# Patient Record
Sex: Female | Born: 1965
Health system: Southern US, Community
[De-identification: ages and names within clinical notes are randomized; demographics above are authoritative.]

## PROBLEM LIST (undated history)

## (undated) DIAGNOSIS — G8929 Other chronic pain: Secondary | ICD-10-CM

## (undated) DIAGNOSIS — N939 Abnormal uterine and vaginal bleeding, unspecified: Secondary | ICD-10-CM

## (undated) DIAGNOSIS — M542 Cervicalgia: Secondary | ICD-10-CM

## (undated) DIAGNOSIS — F419 Anxiety disorder, unspecified: Secondary | ICD-10-CM

## (undated) DIAGNOSIS — N809 Endometriosis, unspecified: Secondary | ICD-10-CM

## (undated) DIAGNOSIS — N946 Dysmenorrhea, unspecified: Secondary | ICD-10-CM

## (undated) DIAGNOSIS — D649 Anemia, unspecified: Secondary | ICD-10-CM

## (undated) DIAGNOSIS — G4700 Insomnia, unspecified: Secondary | ICD-10-CM

## (undated) DIAGNOSIS — K589 Irritable bowel syndrome without diarrhea: Secondary | ICD-10-CM

## (undated) DIAGNOSIS — D219 Benign neoplasm of connective and other soft tissue, unspecified: Secondary | ICD-10-CM

## (undated) DIAGNOSIS — R32 Unspecified urinary incontinence: Secondary | ICD-10-CM

## (undated) HISTORY — DX: Unspecified urinary incontinence: R32

## (undated) HISTORY — DX: Benign neoplasm of connective and other soft tissue, unspecified: D21.9

## (undated) HISTORY — DX: Anxiety disorder, unspecified: F41.9

## (undated) HISTORY — PX: TUBAL LIGATION: SHX77

## (undated) HISTORY — DX: Irritable bowel syndrome, unspecified: K58.9

## (undated) HISTORY — PX: CERVICAL FUSION: SHX112

## (undated) HISTORY — PX: OTHER SURGICAL HISTORY: SHX169

## (undated) HISTORY — DX: Insomnia, unspecified: G47.00

## (undated) HISTORY — DX: Anemia, unspecified: D64.9

## (undated) HISTORY — DX: Other chronic pain: G89.29

## (undated) HISTORY — PX: PELVIC LAPAROSCOPY: SHX162

## (undated) HISTORY — PX: KNEE SURGERY: SHX244

## (undated) HISTORY — PX: ABDOMINAL HYSTERECTOMY: SHX81

## (undated) HISTORY — DX: Cervicalgia: M54.2

## (undated) HISTORY — DX: Abnormal uterine and vaginal bleeding, unspecified: N93.9

## (undated) HISTORY — DX: Endometriosis, unspecified: N80.9

## (undated) HISTORY — PX: ENDOMETRIAL ABLATION: SHX621

## (undated) HISTORY — PX: DILATION AND CURETTAGE OF UTERUS: SHX78

## (undated) HISTORY — PX: BREAST BIOPSY: SHX20

## (undated) HISTORY — PX: BREAST CYST ASPIRATION: SHX578

## (undated) HISTORY — PX: ABDOMINAL HYSTERECTOMY: SUR658

## (undated) HISTORY — DX: Dysmenorrhea, unspecified: N94.6

---

## 2002-12-23 ENCOUNTER — Other Ambulatory Visit: Admission: RE | Admit: 2002-12-23 | Discharge: 2002-12-23 | Payer: Self-pay | Admitting: *Deleted

## 2003-09-28 ENCOUNTER — Emergency Department (HOSPITAL_COMMUNITY): Admission: EM | Admit: 2003-09-28 | Discharge: 2003-09-28 | Payer: Self-pay | Admitting: Emergency Medicine

## 2003-12-01 ENCOUNTER — Inpatient Hospital Stay (HOSPITAL_COMMUNITY): Admission: RE | Admit: 2003-12-01 | Discharge: 2003-12-03 | Payer: Self-pay | Admitting: Obstetrics and Gynecology

## 2003-12-01 ENCOUNTER — Encounter (INDEPENDENT_AMBULATORY_CARE_PROVIDER_SITE_OTHER): Payer: Self-pay | Admitting: Specialist

## 2003-12-05 ENCOUNTER — Inpatient Hospital Stay (HOSPITAL_COMMUNITY): Admission: AD | Admit: 2003-12-05 | Discharge: 2003-12-05 | Payer: Self-pay | Admitting: Obstetrics and Gynecology

## 2004-01-17 ENCOUNTER — Emergency Department (HOSPITAL_COMMUNITY): Admission: EM | Admit: 2004-01-17 | Discharge: 2004-01-18 | Payer: Self-pay | Admitting: Emergency Medicine

## 2004-02-10 ENCOUNTER — Emergency Department (HOSPITAL_COMMUNITY): Admission: EM | Admit: 2004-02-10 | Discharge: 2004-02-10 | Payer: Self-pay | Admitting: Emergency Medicine

## 2004-04-25 ENCOUNTER — Encounter: Admission: RE | Admit: 2004-04-25 | Discharge: 2004-04-25 | Payer: Self-pay | Admitting: Family Medicine

## 2004-05-25 ENCOUNTER — Encounter: Admission: RE | Admit: 2004-05-25 | Discharge: 2004-08-23 | Payer: Self-pay | Admitting: Neurosurgery

## 2004-06-15 ENCOUNTER — Encounter: Admission: RE | Admit: 2004-06-15 | Discharge: 2004-06-15 | Payer: Self-pay | Admitting: Neurosurgery

## 2004-06-29 ENCOUNTER — Encounter: Admission: RE | Admit: 2004-06-29 | Discharge: 2004-06-29 | Payer: Self-pay | Admitting: Neurosurgery

## 2004-08-09 ENCOUNTER — Encounter: Admission: RE | Admit: 2004-08-09 | Discharge: 2004-08-09 | Payer: Self-pay | Admitting: Neurosurgery

## 2005-11-14 ENCOUNTER — Ambulatory Visit: Payer: Self-pay | Admitting: Family Medicine

## 2006-02-28 ENCOUNTER — Ambulatory Visit: Payer: Self-pay | Admitting: Family Medicine

## 2006-03-14 ENCOUNTER — Ambulatory Visit: Payer: Self-pay | Admitting: Family Medicine

## 2006-05-10 ENCOUNTER — Ambulatory Visit: Payer: Self-pay | Admitting: Family Medicine

## 2006-10-30 ENCOUNTER — Ambulatory Visit: Payer: Self-pay | Admitting: Family Medicine

## 2007-02-04 ENCOUNTER — Encounter
Admission: RE | Admit: 2007-02-04 | Discharge: 2007-02-04 | Payer: Self-pay | Admitting: Physical Medicine and Rehabilitation

## 2007-03-06 ENCOUNTER — Telehealth (INDEPENDENT_AMBULATORY_CARE_PROVIDER_SITE_OTHER): Payer: Self-pay | Admitting: Internal Medicine

## 2007-04-04 ENCOUNTER — Ambulatory Visit: Payer: Self-pay | Admitting: Family Medicine

## 2007-04-04 DIAGNOSIS — M542 Cervicalgia: Secondary | ICD-10-CM | POA: Insufficient documentation

## 2007-04-04 DIAGNOSIS — M4802 Spinal stenosis, cervical region: Secondary | ICD-10-CM | POA: Insufficient documentation

## 2007-04-04 DIAGNOSIS — G43909 Migraine, unspecified, not intractable, without status migrainosus: Secondary | ICD-10-CM | POA: Insufficient documentation

## 2007-04-04 DIAGNOSIS — F430 Acute stress reaction: Secondary | ICD-10-CM | POA: Insufficient documentation

## 2007-04-04 HISTORY — DX: Acute stress reaction: F43.0

## 2007-06-05 DIAGNOSIS — IMO0002 Reserved for concepts with insufficient information to code with codable children: Secondary | ICD-10-CM | POA: Insufficient documentation

## 2007-06-06 ENCOUNTER — Ambulatory Visit: Payer: Self-pay | Admitting: Family Medicine

## 2007-06-06 DIAGNOSIS — G4700 Insomnia, unspecified: Secondary | ICD-10-CM | POA: Insufficient documentation

## 2007-06-07 ENCOUNTER — Telehealth (INDEPENDENT_AMBULATORY_CARE_PROVIDER_SITE_OTHER): Payer: Self-pay | Admitting: *Deleted

## 2007-06-18 HISTORY — PX: OTHER SURGICAL HISTORY: SHX169

## 2007-07-11 ENCOUNTER — Telehealth (INDEPENDENT_AMBULATORY_CARE_PROVIDER_SITE_OTHER): Payer: Self-pay | Admitting: *Deleted

## 2007-07-26 ENCOUNTER — Encounter: Payer: Self-pay | Admitting: Family Medicine

## 2007-09-04 ENCOUNTER — Telehealth: Payer: Self-pay | Admitting: Family Medicine

## 2007-09-11 ENCOUNTER — Telehealth (INDEPENDENT_AMBULATORY_CARE_PROVIDER_SITE_OTHER): Payer: Self-pay | Admitting: *Deleted

## 2007-10-02 ENCOUNTER — Encounter: Admission: RE | Admit: 2007-10-02 | Discharge: 2007-10-02 | Payer: Self-pay | Admitting: Family Medicine

## 2007-10-02 LAB — HM MAMMOGRAPHY: HM Mammogram: NORMAL

## 2007-10-04 ENCOUNTER — Encounter (INDEPENDENT_AMBULATORY_CARE_PROVIDER_SITE_OTHER): Payer: Self-pay | Admitting: *Deleted

## 2007-10-04 ENCOUNTER — Encounter: Payer: Self-pay | Admitting: Family Medicine

## 2007-10-12 ENCOUNTER — Telehealth: Payer: Self-pay | Admitting: Family Medicine

## 2007-12-04 ENCOUNTER — Telehealth: Payer: Self-pay | Admitting: Family Medicine

## 2008-01-08 ENCOUNTER — Ambulatory Visit: Payer: Self-pay | Admitting: Psychiatry

## 2008-01-09 ENCOUNTER — Encounter: Payer: Self-pay | Admitting: Family Medicine

## 2008-01-23 ENCOUNTER — Telehealth: Payer: Self-pay | Admitting: Family Medicine

## 2008-02-29 ENCOUNTER — Telehealth: Payer: Self-pay | Admitting: Family Medicine

## 2008-03-07 ENCOUNTER — Telehealth: Payer: Self-pay | Admitting: Family Medicine

## 2008-03-24 ENCOUNTER — Encounter: Payer: Self-pay | Admitting: Family Medicine

## 2008-04-03 ENCOUNTER — Ambulatory Visit: Payer: Self-pay | Admitting: Family Medicine

## 2008-06-06 ENCOUNTER — Telehealth (INDEPENDENT_AMBULATORY_CARE_PROVIDER_SITE_OTHER): Payer: Self-pay | Admitting: *Deleted

## 2008-07-02 ENCOUNTER — Telehealth (INDEPENDENT_AMBULATORY_CARE_PROVIDER_SITE_OTHER): Payer: Self-pay | Admitting: *Deleted

## 2008-07-21 ENCOUNTER — Telehealth: Payer: Self-pay | Admitting: Family Medicine

## 2008-08-19 ENCOUNTER — Telehealth: Payer: Self-pay | Admitting: Family Medicine

## 2008-08-28 ENCOUNTER — Telehealth: Payer: Self-pay | Admitting: Family Medicine

## 2008-10-20 ENCOUNTER — Telehealth: Payer: Self-pay | Admitting: Family Medicine

## 2008-12-15 ENCOUNTER — Telehealth (INDEPENDENT_AMBULATORY_CARE_PROVIDER_SITE_OTHER): Payer: Self-pay | Admitting: *Deleted

## 2009-01-23 ENCOUNTER — Telehealth: Payer: Self-pay | Admitting: Family Medicine

## 2009-01-26 ENCOUNTER — Ambulatory Visit: Payer: Self-pay | Admitting: Family Medicine

## 2009-01-30 ENCOUNTER — Telehealth: Payer: Self-pay | Admitting: Family Medicine

## 2009-02-10 ENCOUNTER — Telehealth: Payer: Self-pay | Admitting: Family Medicine

## 2009-02-20 ENCOUNTER — Telehealth: Payer: Self-pay | Admitting: Family Medicine

## 2009-03-12 ENCOUNTER — Telehealth: Payer: Self-pay | Admitting: Family Medicine

## 2009-03-23 ENCOUNTER — Telehealth: Payer: Self-pay | Admitting: Family Medicine

## 2009-04-03 ENCOUNTER — Telehealth: Payer: Self-pay | Admitting: Family Medicine

## 2009-05-01 ENCOUNTER — Telehealth: Payer: Self-pay | Admitting: Family Medicine

## 2009-06-23 ENCOUNTER — Telehealth: Payer: Self-pay | Admitting: Family Medicine

## 2009-07-02 ENCOUNTER — Telehealth: Payer: Self-pay | Admitting: Family Medicine

## 2009-08-12 ENCOUNTER — Telehealth: Payer: Self-pay | Admitting: Family Medicine

## 2009-08-20 ENCOUNTER — Telehealth: Payer: Self-pay | Admitting: Family Medicine

## 2009-09-09 ENCOUNTER — Encounter: Payer: Self-pay | Admitting: Family Medicine

## 2009-09-09 ENCOUNTER — Telehealth: Payer: Self-pay | Admitting: Family Medicine

## 2009-09-14 ENCOUNTER — Telehealth: Payer: Self-pay | Admitting: Family Medicine

## 2009-10-12 ENCOUNTER — Telehealth: Payer: Self-pay | Admitting: Family Medicine

## 2009-12-24 ENCOUNTER — Telehealth: Payer: Self-pay | Admitting: Family Medicine

## 2010-03-01 ENCOUNTER — Telehealth: Payer: Self-pay | Admitting: Family Medicine

## 2010-03-22 ENCOUNTER — Telehealth: Payer: Self-pay | Admitting: Family Medicine

## 2010-04-20 ENCOUNTER — Telehealth: Payer: Self-pay | Admitting: Family Medicine

## 2010-04-28 ENCOUNTER — Ambulatory Visit: Payer: Self-pay | Admitting: Family Medicine

## 2010-05-05 ENCOUNTER — Telehealth: Payer: Self-pay | Admitting: Family Medicine

## 2010-05-11 ENCOUNTER — Telehealth: Payer: Self-pay | Admitting: Family Medicine

## 2010-05-14 ENCOUNTER — Telehealth: Payer: Self-pay | Admitting: Family Medicine

## 2010-06-02 ENCOUNTER — Telehealth: Payer: Self-pay | Admitting: Family Medicine

## 2010-06-09 ENCOUNTER — Telehealth: Payer: Self-pay | Admitting: Family Medicine

## 2010-10-06 ENCOUNTER — Telehealth: Payer: Self-pay | Admitting: Family Medicine

## 2010-10-07 ENCOUNTER — Telehealth: Payer: Self-pay | Admitting: Family Medicine

## 2010-10-13 ENCOUNTER — Telehealth: Payer: Self-pay | Admitting: Family Medicine

## 2010-10-19 ENCOUNTER — Telehealth: Payer: Self-pay | Admitting: Family Medicine

## 2010-11-07 ENCOUNTER — Encounter: Payer: Self-pay | Admitting: Neurosurgery

## 2010-11-07 ENCOUNTER — Encounter: Payer: Self-pay | Admitting: Family Medicine

## 2010-11-09 ENCOUNTER — Telehealth: Payer: Self-pay | Admitting: Family Medicine

## 2010-11-10 ENCOUNTER — Telehealth: Payer: Self-pay | Admitting: Family Medicine

## 2010-11-16 NOTE — Progress Notes (Signed)
Summary: Need another refill on Tramadol until get mail order  Phone Note Call from Patient Call back at [phone]   Caller: Patient Call For: Judith Part MD Summary of Call: Pt said when she called [facility] to check on status of Tramadol order. Pt was told there was no record of med being called in on 05/07/10. Pt also said she only had 3 of the Tramadol # 15 that I called in on Fri 05/07/10. Pt wanted me to ck with the [facility] on that rx and also wanted me to ask Dr Milinda Antis if she could [facility] [place] [phone]. I called [facility] at [phone] and spoke with [facility]. Matt found the order in their system but processing has not begun because they need payment and shipping information. I told Susy Frizzle I would have pt call back with info. What should I tell pt about another small refill for [facility] [place]?Please advise.  Initial call taken by: Lewanda Rife LPN,  May 11, 2010 12:57 PM  Follow-up for Phone Call        can call in 15 days of med -px written on EMR for call in (this is 90 pills) I will not do this again- so she needs to get set with this mail pharmacy and talk to them personally  Follow-up by: Judith Part MD,  May 11, 2010 1:32 PM  Additional Follow-up for Phone Call Additional follow up Details #1::        Patient notified as instructed by telephone. Pt said she had gotten things straightened out at mail order pharmacy. Medication [facility]oned [facility] [place] [facility] as instructed. Lewanda Rife LPN  May 11, 2010 2:10 PM     New/Updated Medications: ULTRAM 50 MG TABS (TRAMADOL HCL) 1-2 by mouth up to three times a day as needed pain Prescriptions: ULTRAM 50 MG TABS (TRAMADOL HCL) 1-2 by mouth up to three times a day as needed pain  #90 x 0   Entered and Authorized by:   Judith Part MD   Signed by:   Lewanda Rife LPN on 69/62/9528   Method used:   Telephoned to ...       [facility]* (retail)       [address]  [place], [place]  [address]       Ph: [phone]       Fax: [phone]   RxID:   [id number]

## 2010-11-16 NOTE — Progress Notes (Signed)
Summary: refill request for ultram  Phone Note Refill Request Message from:  Fax from Pharmacy  Refills Requested: Medication #1:  ULTRAM 50 MG TABS 1 by mouth three times a day as needed pain   Last Refilled: 12/10/2009 [facility] [place] [place]  [phone].  Initial call taken by: Lowella Petties CMA,  December 24, 2009 1:00 PM  Follow-up for Phone Call        please schedule f/u this spring  Follow-up by: Judith Part MD,  December 24, 2009 1:58 PM  Additional Follow-up for Phone Call Additional follow up Details #1::        [facility] [place]  [facility]. Requested note for pt to call for appt.Lewanda Rife LPN  December 24, 2009 2:30 PM     New/Updated Medications: ULTRAM 50 MG TABS (TRAMADOL HCL) 1 by mouth three times a day as needed pain Prescriptions: ULTRAM 50 MG TABS (TRAMADOL HCL) 1 by mouth three times a day as needed pain  #90 x 2   Entered and Authorized by:   Judith Part MD   Signed by:   Lewanda Rife LPN on 18/84/1660   Method used:   Telephoned to ...       [facility]* (retail)       [address]       [place], [place]  [address]       Ph: [phone]       Fax: [phone]   RxID:   [id number]

## 2010-11-16 NOTE — Progress Notes (Signed)
Summary: refill request for ultram  Phone Note Refill Request Message from:  Fax from Pharmacy  Refills Requested: Medication #1:  ULTRAM 50 MG TABS 1-2 by mouth three times a day as needed pain   Last Refilled: 05/11/2010 Faxed form from [facility] is on your shelf.  Initial call taken by: Lowella Petties CMA,  June 09, 2010 9:26 AM  Follow-up for Phone Call        form done and in nurse in box  Follow-up by: Judith Part MD,  June 09, 2010 10:47 AM  Additional Follow-up for Phone Call Additional follow up Details #1::        completed form faxed to [phone] as instructed.Lewanda Rife LPN  June 09, 2010 11:02 AM     Prescriptions: ULTRAM 50 MG TABS (TRAMADOL HCL) 1-2 by mouth up to three times a day as needed pain  #180 x 3   Entered and Authorized by:   Judith Part MD   Signed by:   Lewanda Rife LPN on 09/81/1914   Method used:   Historical   RxID:   [id number]

## 2010-11-16 NOTE — Progress Notes (Signed)
Summary: Rx Ambien  Phone Note Refill Request Call back at [phone] Message from:  [facility] on Mar 01, 2010 4:27 PM  Refills Requested: Medication #1:  AMBIEN 10 MG  TABS 1 by mouth at bedtime prn   Last Refilled: 10/12/2009 Received faxed refill request please advise.   Method Requested: Telephone to Pharmacy Initial call taken by: Linde Gillis CMA Duncan Dull),  Mar 01, 2010 4:27 PM  Follow-up for Phone Call        px written on EMR for call in has not been seen in over a year so sched f/u this summer please   Follow-up by: Judith Part MD,  Mar 01, 2010 4:52 PM  Additional Follow-up for Phone Call Additional follow up Details #1::        [facility] and pharmacist will add note for pt to call for appt. Lewanda Rife LPN  Mar 01, 2010 5:18 PM     New/Updated Medications: AMBIEN 10 MG  TABS (ZOLPIDEM TARTRATE) 1 by mouth at bedtime prn Prescriptions: AMBIEN 10 MG  TABS (ZOLPIDEM TARTRATE) 1 by mouth at bedtime prn  #30 x 0   Entered by:   Judith Part MD   Authorized by:   Ruthe Mannan MD   Signed by:   Lewanda Rife LPN on 45/40/9811   Method used:   Telephoned to ...       [facility]* (retail)       [address]       [place], [place]  [address]       Ph: [phone]       Fax: [phone]   RxID:   [id number]

## 2010-11-16 NOTE — Progress Notes (Signed)
Summary: Rx Tramadol  Phone Note Call from Patient Call back at [phone]   Caller: Patient Call For: Judith Part MD Summary of Call: Patient needs a refill on her Tramadol before she can get in to see you. Patient has an appointment scheduled with you July 13th. [phone]. Initial call taken by: Sydell Axon LPN,  April 20, 4781 4:45 PM  Follow-up for Phone Call        px written on EMR for call in  Follow-up by: Judith Part MD,  April 20, 2010 5:13 PM  Additional Follow-up for Phone Call Additional follow up Details #1::        [phone] [facility] as instructed. Left message for patient to call back. Lewanda Rife LPN  April 20, 7845 5:35 PM   Left message for patient to call back. Lewanda Rife LPN  April 21, 9628 11:44 AM   Left message for patient to call back. Lewanda Rife LPN  April 22, 5283 5:10 PM   Patient notified by voicemail. Sydell Axon LPN  April 23, 1323 8:24 AM     Prescriptions: ULTRAM 50 MG TABS (TRAMADOL HCL) 1 by mouth three times a day as needed pain  #90 x 0   Entered and Authorized by:   Judith Part MD   Signed by:   Lewanda Rife LPN on 40/07/2724   Method used:   Telephoned to ...       [facility] (retail)       [address]       [place], [place]  [address]       Ph: [phone]       Fax: [phone]   RxID:   [id number]   Appended Document: Rx Tramadol Patient notified as instructed by telephone.

## 2010-11-16 NOTE — Progress Notes (Signed)
Summary: Tramadol 50mg  rx  Phone Note Refill Request Call back at [phone] or [phone] Message from:  Patient on June 02, 2010 9:45 AM  Refills Requested: Medication #1:  ULTRAM 50 MG TABS 1-2 by mouth three times a day as needed pain Pt left v/m  requesting  rx for Tramadol 50mg  #180 faxed to [facility] at [phone] fax # and [phone] [facility] phone #. Pt said it takes 2-9 days to get rx processed and mailed. Pt also left order # [id number] (? if need this #). Please advise.    Method Requested: Fax to Mail Away Pharmacy Initial call taken by: Lewanda Rife LPN,  June 02, 2010 9:48 AM  Follow-up for Phone Call        this was just done on 7/13 and I gave some to get her by on 7/26 please look at phone note from 7/26 and 7/20 I am confused Follow-up by: Judith Part MD,  June 02, 2010 10:53 AM  Additional Follow-up for Phone Call Additional follow up Details #1::        Spoke with pt. Pt said she did not get the [facility] [place] [facility] because #90 was going to cost her $40.00. Pt said her neck pain is really bothering her and even waking her up at night so pt is taking 2 pills each time and averaging taking 8-10 pills daily for a 24 hr period. Pt has #48 pills left but if she orders with 2-9 day return she gets free shipping so that it s why she is ordering early.Please advise. Lewanda Rife LPN  June 02, 2010 11:14 AM     Additional Follow-up for Phone Call Additional follow up Details #2::    according to note on 7/26-- someone talked to [facility] and he was processing order then  also -I do not want her taking more than 2 pills three times a day -- this is the max safe amt (6 per day) Follow-up by: Judith Part MD,  June 02, 2010 1:49 PM  Additional Follow-up for Phone Call Additional follow up Details #3:: Details for Additional Follow-up Action Taken: Patient notified as instructed by telephone. Lewanda Rife  LPN  June 02, 2010 4:14 PM

## 2010-11-16 NOTE — Progress Notes (Signed)
Summary: Medication Refill Request  Phone Note Call from Patient   Caller: Patient Call For: Laurie [name] Reason for Call: Refill Medication Summary of Call: Medication Refill for Tramadol 50 mg takes 3 x daily. Pt says she only has 1 pill left, and will not be able to come for her appt until she gets paid. Explained to pt we could bill her for the appt.  Pt has an appt scheduled for June 14th at 12:30pm...   [phone]- Call back # Pharmacy [facility]- [phone]   Initial call taken by: Daine Gip,  March 22, 2010 9:59 AM  Follow-up for Phone Call        px written on EMR for call in  Follow-up by: Laurie [name],  March 22, 2010 12:32 PM  Additional Follow-up for Phone Call Additional follow up Details #1::        Medication phoned to pharmacy.  Additional Follow-up by: Delilah Shan CMA (AAMA),  March 22, 2010 4:00 PM    Prescriptions: ULTRAM 50 MG TABS (TRAMADOL HCL) 1 by mouth three times a day as needed pain  #90 x 0   Entered and Authorized by:   Laurie [name]   Signed by:   Laurie [name] on 03/22/2010   Method used:   Telephoned to ...       [facility] (retail)       [address]       [place], [place]  [address]       Ph: [phone]       Fax: [phone]   RxID:   [id number]

## 2010-11-16 NOTE — Progress Notes (Signed)
Summary: Poison oak  Phone Note Call from Patient Call back at [phone] or [phone]   Caller: Patient Call For: Judith Part MD Summary of Call: Pt has poison oak on legs, arms, chest, and stomach. Pt said it is spreading quickly. Pt has tried OTC creams but not helping. Pt has not taken Zyrtec or Benadryl for the itching.  Pt wants med for poison oak and itching called in [facility] [place] [phone]. Please advise.  Initial call taken by: Lewanda Rife LPN,  May 14, 2010 2:30 PM  Follow-up for Phone Call        cannot do more than px cortisone cream without seeing her  (she may end up needing prednisone)  px written on EMR for call in - for elicon cream keep clean and dry, continue zyrtec can try aaveno oatmeal bath (keep water cool) set her up appt with [facility] please Follow-up by: Judith Part MD,  May 14, 2010 4:16 PM  Additional Follow-up for Phone Call Additional follow up Details #1::        Patient notified as instructed by telephone. Pt has a hair appt tomorrow that she cannot miss and she lives near [place]. Pt said to call in med to [facility] [place] and if not better next week will call back for appt and if gets worse over weekend will go to [facility] near where she lives. Dr. Milinda Antis the rx for the Elicon cream did not come thru. Please advise. Lewanda Rife LPN  May 14, 2010 4:22 PM   Medication phoned [facility] [place] [facility] as instructed. Patient notified as instructed by telephone. Lewanda Rife LPN  May 14, 2010 4:54 PM     New/Updated Medications: ELOCON 0.1 % CREA (MOMETASONE FUROATE) apply to affected area once daily as needed Prescriptions: ELOCON 0.1 % CREA (MOMETASONE FUROATE) apply to affected area once daily as needed  #1 med x 1   Entered and Authorized by:   Judith Part MD   Signed by:   Lewanda Rife LPN on 10/62/6948   Method used:   Telephoned to ...       [facility] (retail)       [address]       [place], [place]  [address]       Ph:  [phone]       Fax: [phone]   RxID:   [id number]

## 2010-11-16 NOTE — Progress Notes (Signed)
Summary: wants tramadol called to pharmacy  Phone Note Call from Patient Call back at [phone]   Caller: Patient Call For: Judith Part MD Summary of Call: Pt is going to go through an online pharmacy with the tramadol script that she was given at her last office visit.  She is asking that we call this into [facility], phone [phone].  Will you need the written script back?  Please let pt know. Initial call taken by: Lowella Petties CMA,  May 05, 2010 3:27 PM  Follow-up for Phone Call        I need the written px back  then go ahead and call it in  Follow-up by: Judith Part MD,  May 05, 2010 5:03 PM  Additional Follow-up for Phone Call Additional follow up Details #1::        Left message for patient to call back. Lewanda Rife LPN  May 05, 2010 5:29 PM   Patient notified as instructed by telephone. Will hold note on desktop until pt drops off the rx so I can call in the rx. Lewanda Rife LPN  May 06, 2010 8:35 AM   Pt called back and requested #15 of Tramadol be called [facility] to [facility] [place] until get mail order rx. Dr. Milinda Antis said as soon as she gets rx back she will call  in #15 to [facility] [place] [phone]. Pt also wants to mail the rx to our office. Pt was advised if rx was lost in mail we could not call in any med anywhere. Pt said she was in [facility] today and tomorrow and might mail it certified. Pt does not want to mail to pharmacy directly for fear it might get misplaced. Will wait to proceed until gets rx back Pt understands.Lewanda Rife LPN  May 06, 2010 8:49 AM     Additional Follow-up for Phone Call Additional follow up Details #2::    Received rx in mail today. Medication phoned to [facility] pharmacy as instructed. Med called to [place] with quantitiy of #15 until gets mail order med. Patient notified as instructed by telephone.  Rx given to Dr. Milinda Antis to be destroyed.Lewanda Rife LPN  May 07, 2010 4:30 PM

## 2010-11-16 NOTE — Assessment & Plan Note (Signed)
Summary: REFILL MEDICATIONS/CLE   Vital Signs:  Patient profile:   45 year old female Height:      63 inches Weight:      117 pounds BMI:     20.80 Temp:     98.1 degrees F oral Pulse rate:   76 / minute Pulse rhythm:   regular BP sitting:   100 / 72  (left arm) Cuff size:   regular  Vitals Entered By: Lewanda Rife LPN (April 28, 2010 12:30 PM) CC: med refills   History of Present Illness: here for f/u of multiple problems incl migraine/ chronic neck pain(stenosis)/ insomnia and anxiety  wt and bp are stable  is having a slow summer   feeling pretty good   headaches seem to be better overall - they are less frequent   neck is bothering her more - needs to have inc med  ? can she take 2 of the ultram  cannot afford the surgery/ fusion or f/u at this time (no insurance)  needs more ultram does some accupuncture  chiropractic manipulation- avoids has home heat and traction  heat and [place] help   is not taking the ambien anymore -- is sleeping better  it is her neck keeping her up    no longer taking prozac - stress is much better - no longer married  that is so much better   imitrex does make her nauseated - but only tryptan she can afford -- does not have to take it often        Allergies: 1)  ! Imitrex Statdose System (Sumatriptan Succinate) 2)  Penicillin 3)  Penicillin G Potassium (Penicillin G Potassium) 4)  Codeine Phosphate (Codeine Phosphate) 5)  Codeine  Past History:  Past Medical History: Last updated: 01/26/2009 migraine  chronic neck pain from disc dz  anxiety/ situational  insomnia/organic sleep disorder (failed multiple meds) IBS diarrhea predominant   Past Surgical History: Last updated: 06/05/2007 Knee surgery Epidural injections Deg C-S disk/ spinal stenosis 92008)  Family History: Last updated: 06/06/2007 GM CAD father CAD, copd GF died of massive MI  Social History: Last updated: 04/28/2010 non smoker occ alcohol-- very  rare if any divorced  has a boyfriend   Social History: non smoker occ alcohol-- very rare if any divorced  has a boyfriend   Review of Systems General:  Denies fatigue, loss of appetite, and malaise. Eyes:  Denies discharge and eye irritation. CV:  Denies chest pain or discomfort and swelling of feet. Resp:  Denies cough and wheezing. GI:  Denies abdominal pain, indigestion, and nausea. MS:  Complains of joint pain and stiffness; denies joint redness, joint swelling, low back pain, and muscle weakness. Derm:  Denies lesion(s), poor wound healing, and rash. Neuro:  Complains of headaches; denies numbness and tingling. Psych:  Complains of anxiety. Endo:  Denies excessive thirst and excessive urination. Heme:  Denies abnormal bruising and bleeding.  Physical Exam  General:  slim and well appearing  Head:  normocephalic, atraumatic, and no abnormalities observed.   Eyes:  vision grossly intact, pupils equal, pupils round, and pupils reactive to light.  no conjunctival pallor, injection or icterus  Ears:  R ear normal and L ear normal.   Nose:  no nasal discharge.   Mouth:  pharynx pink and moist.   Neck:  supple with full rom and no masses or thyromegally, no JVD or carotid bruit  Chest Wall:  No deformities, masses, or tenderness noted. Lungs:  Normal respiratory effort,  chest expands symmetrically. Lungs are clear to auscultation, no crackles or wheezes. Heart:  Normal rate and regular rhythm. S1 and S2 normal without gallop, murmur, click, rub or other extra sounds. Abdomen:  Bowel sounds positive,abdomen soft and non-tender without masses, organomegaly or hernias noted. no renal bruits  Msk:  No deformity or scoliosis noted of thoracic or lumbar spine.  poor rom neck - some lower CS bony tenderness no acute joint changes  Pulses:  R and L carotid,radial,femoral,dorsalis pedis and posterior tibial pulses are full and equal bilaterally Extremities:  No clubbing, cyanosis,  edema, or deformity noted with normal full range of motion of all joints.   Neurologic:  alert & oriented X3, cranial nerves II-XII intact, strength normal in all extremities, sensation intact to light touch, gait normal, and DTRs symmetrical and normal.   Skin:  Intact without suspicious lesions or rashes Cervical Nodes:  No lymphadenopathy noted Inguinal Nodes:  No significant adenopathy Psych:  normal affect, talkative and pleasant    Impression & Recommendations:  Problem # 1:  NECK PAIN, CHRONIC (ICD-723.1) with spinal stenosis - in need of fusion in future disc pain control- has no insurance will up ultram to 2 pills when she needs it  narcotics form filled out and disc  Her updated medication list for this problem includes:    Ketorolac Tromethamine 10 Mg Tabs (Ketorolac tromethamine) ..... One by mouth every 6 hours with food as needed headache    Ultram 50 Mg Tabs (Tramadol hcl) .Marland Kitchen... 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 1 tab by mouth daily  Problem # 4:  MIGRAINE NOS W/O INTRACTABLE MIGRAINE (ICD-346.90) less frequent  gets some nausea on imitrex (only tryptan she can afford- but can take promethazine with it) meds refilled  The following medications were removed from the medication list:    Imitrex 100 Mg Tabs (Sumatriptan succinate) .Marland Kitchen... 1 by mouth times one for migraine headache Her updated medication list for this problem includes:    Ketorolac Tromethamine 10 Mg Tabs (Ketorolac tromethamine) ..... One by mouth every 6 hours with food as needed headache    Ultram 50 Mg Tabs (Tramadol hcl) .Marland Kitchen... 1-2 by mouth three times a day as needed  pain    Imitrex 100 Mg Tabs (Sumatriptan succinate) .Marland Kitchen... 1 by mouth once daily as needed headache as directed  Complete Medication List: 1)  Ketorolac Tromethamine 10 Mg Tabs (Ketorolac tromethamine) .... One by mouth every 6 hours with food as needed headache 2)  Promethazine Hcl 25 Mg Tabs (Promethazine hcl) .Marland Kitchen.. 1 by mouth three times a day as needed nausea/vomiting with headache 3)  Ultram 50 Mg Tabs (Tramadol hcl) .Marland Kitchen.. 1-2 by mouth three times a day as needed pain 4)  Imitrex 100 Mg Tabs (Sumatriptan succinate) .Marland Kitchen.. 1 by mouth once daily as needed headache as directed  Patient Instructions: 1)  use caution with ultram - it can sedate at higher doses  2)  continue heat and accupuncture for neck  3)  let us know if you need follow up with Dr Ethelene Hal  4)  here are px to fill  5)  must call several days before ultram is out  Prescriptions: ULTRAM 50 MG TABS (TRAMADOL HCL) 1-2 by mouth three times a day as needed pain  #180 x 0   Entered and Authorized by:   Judith Part MD   Signed by:   Judith Part MD on 04/28/2010   Method used:   Print then Give to Patient   RxID:   [id number] IMITREX 100 MG TABS (SUMATRIPTAN SUCCINATE) 1 by mouth once daily as needed headache as directed  #9 x 11   Entered and Authorized by:   Judith Part MD   Signed by:   Judith Part MD on 04/28/2010   Method used:   Print then Give to Patient   RxID:   [id number] PROMETHAZINE HCL 25 MG TABS (PROMETHAZINE HCL) 1 by mouth three times a day as needed nausea/vomiting with headache  #30 x 1   Entered and Authorized by:   Judith Part MD   Signed by:   Judith Part MD on 04/28/2010   Method used:   Print then Give to Patient   RxID:   [id number] KETOROLAC TROMETHAMINE 10 MG TABS (KETOROLAC TROMETHAMINE) one by mouth every 6 hours with food as needed headache  #30 x 1   Entered and Authorized by:   Judith Part MD   Signed by:   Judith Part MD on 04/28/2010   Method used:    Print then Give to Patient   RxID:   [id number]   Current Allergies (reviewed today): ! IMITREX STATDOSE SYSTEM (SUMATRIPTAN SUCCINATE) PENICILLIN PENICILLIN G POTASSIUM (PENICILLIN G POTASSIUM) CODEINE PHOSPHATE (CODEINE PHOSPHATE) CODEINE

## 2010-11-18 NOTE — Progress Notes (Signed)
Summary: Ambien rx requested  Phone Note Call from Patient Call back at [phone]   Caller: Patient Call For: Judith Part MD Summary of Call: Pt left v/m that she has been having trouble sleeping at night and requested Ambien for 1 month. Ambien 10mg  was removed from pt med list 04/28/10. Pt was taking Ambien 10mg  One by mouth at bedtime as needed.Pt would [facility] [place] [place] [phone]. Pt can be reached at [phone].Please advise.  Initial call taken by: Lewanda Rife LPN,  October 19, 2010 2:50 PM  Follow-up for Phone Call        that is ok for occas use px written on EMR for call in  Follow-up by: Judith Part MD,  October 19, 2010 3:21 PM  Additional Follow-up for Phone Call Additional follow up Details #1::        Patient notified as instructed by telephone. [facility]ication [facility]oned [facility] [place] [phone]. pharmacy as instructed. Lewanda Rife LPN  October 19, 2010 4:45 PM     New/Updated Medications: AMBIEN 10 MG TABS (ZOLPIDEM TARTRATE) 1 by mouth at bedtime as needed insomnia Prescriptions: AMBIEN 10 MG TABS (ZOLPIDEM TARTRATE) 1 by mouth at bedtime as needed insomnia  #30 x 0   Entered and Authorized by:   Judith Part MD   Signed by:   Lewanda Rife LPN on 16/10/930   Method used:   Telephoned to ...       [facility] (retail)       [address]       [place], [place]  [address]       Ph: [phone]       Fax: [phone]   RxID:   [id number]

## 2010-11-18 NOTE — Progress Notes (Signed)
Summary: refill request for phenergan  Phone Note Refill Request Call back at [phone] Message from:  Patient  Refills Requested: Medication #1:  PROMETHAZINE HCL 25 MG TABS 1 by mouth three times a day as needed nausea/vomiting with headache [facility] [place] [place]  phone [phone].  Initial call taken by: Lowella Petties CMA, AAMA,  October 06, 2010 12:58 PM  Follow-up for Phone Call        px written on EMR for call in  Follow-up by: Judith Part MD,  October 06, 2010 1:20 PM  Additional Follow-up for Phone Call Additional follow up Details #1::        [facility] [place] [phone] pharmacy as instructed. Patient notified as instructed by telephone. Lewanda Rife LPN  October 06, 2010 1:49 PM     Prescriptions: PROMETHAZINE HCL 25 MG TABS (PROMETHAZINE HCL) 1 by mouth three times a day as needed nausea/vomiting with headache  #30 x 3   Entered and Authorized by:   Judith Part MD   Signed by:   Lewanda Rife LPN on 65/78/4696   Method used:   Telephoned to ...       [facility]* (retail)       [address]       [place], [place]  [address]       Ph: [phone]       Fax: [phone]   RxID:   [id number]

## 2010-11-18 NOTE — Progress Notes (Signed)
Summary: ? tramadol refill  Phone Note Call from Patient Call back at [phone]   Caller: Patient Call For: Judith Part MD Summary of Call: Pt left v/m on 10/12/10 at 4:22pm that [facility] not [facility]ills on Tramadol. I called [facility] at [phone] and spoke with Annabelle Harman. There is a rx for Tramadol on hold that they can fill tomorrow. Last filled 09/16/10. May take up to 48 hours to process after filled. Left message for patient to call back. Lewanda Rife LPN  October 13, 2010 11:49 AM   Advised pt, she will call pharmacy. Initial call taken by: Lowella Petties CMA, AAMA,  October 14, 2010 2:26 PM

## 2010-11-18 NOTE — Progress Notes (Signed)
Summary: Ambien rx  Phone Note Refill Request Call back at [phone] Message from:  Patient on November 10, 2010 2:59 PM  Refills Requested: Medication #1:  AMBIEN 10 MG TABS 1 by mouth at bedtime as needed insomnia.   Last Refilled: 10/19/2010 Pt going out of town that is why she is calling early for med refill on Ambien. Pt [facility] [place] [phone].Please advise.    Method Requested: Telephone to Pharmacy Initial call taken by: Lewanda Rife LPN,  November 10, 2010 3:00 PM  Follow-up for Phone Call        px written on EMR for call in  Follow-up by: Judith Part MD,  November 10, 2010 4:25 PM  Additional Follow-up for Phone Call Additional follow up Details #1::        Med[facility] [place]  pharmacy as instructed. Left message for patient to call back. Lewanda Rife LPN  November 10, 2010 4:44 PM   Left message for patient to call back. Lewanda Rife LPN  November 11, 2010 8:33 AM   Patient notified as instructed by telephone. Lewanda Rife LPN  November 12, 2010 11:18 AM     New/Updated Medications: AMBIEN 10 MG TABS (ZOLPIDEM TARTRATE) 1 by mouth at bedtime as needed insomnia Prescriptions: AMBIEN 10 MG TABS (ZOLPIDEM TARTRATE) 1 by mouth at bedtime as needed insomnia  #30 x 0   Entered and Authorized by:   Judith Part MD   Signed by:   Lewanda Rife LPN on 47/82/9562   Method used:   Telephoned to ...       [facility]* (retail)       [address]       [place], [place]  [address]       Ph: [phone]       Fax: [phone]   RxID:   [id number]

## 2010-11-18 NOTE — Progress Notes (Signed)
Summary: tramadol  Phone Note Refill Request Message from:  Fax from Pharmacy on October 07, 2010 8:44 AM  Refills Requested: Medication #1:  ULTRAM 50 MG TABS 1-2 by mouth three times a day as needed pain   Last Refilled: 08/02/2010 [facility] [phone]  Initial call taken by: Melody Comas,  October 07, 2010 8:45 AM  Follow-up for Phone Call        px written on EMR for call in  Follow-up by: Judith Part MD,  October 07, 2010 9:54 AM  Additional Follow-up for Phone Call Additional follow up Details #1::        Medication phoned to [facility] [phone]  pharmacy spoke with Benice as instructed. Lewanda Rife LPN  October 07, 2010 10:24 AM     New/Updated Medications: ULTRAM 50 MG TABS (TRAMADOL HCL) 1-2 by mouth three times a day as needed pain Prescriptions: ULTRAM 50 MG TABS (TRAMADOL HCL) 1-2 by mouth three times a day as needed pain  #180 x 0   Entered and Authorized by:   Judith Part MD   Signed by:   Lewanda Rife LPN on 46/96/2952   Method used:   Telephoned to ...       [facility] (retail)       [address]       [place], [place]  [address]       Ph: [phone]       Fax: [phone]   RxID:   [id number]

## 2010-11-18 NOTE — Progress Notes (Signed)
Summary: refill request for tramadol  Phone Note Refill Request Message from:  Fax from Pharmacy  Refills Requested: Medication #1:  ULTRAM 50 MG TABS 1-2 by mouth up to three times a day as needed pain   Last Refilled: 08/02/2010 Faxed request from [facility] is on your shelf.  Initial call taken by: Lowella Petties CMA, AAMA,  November 09, 2010 2:43 PM  Follow-up for Phone Call        px written on EMR for call in   Follow-up by: Judith Part MD,  November 09, 2010 4:54 PM  Additional Follow-up for Phone Call Additional follow up Details #1::        Spoke with Thayer Ohm at [facility] [phone] Medication phoned to pharmacy as instructed. Lewanda Rife LPN  November 09, 2010 5:01 PM     Prescriptions: ULTRAM 50 MG TABS (TRAMADOL HCL) 1-2 by mouth three times a day as needed pain  #180 x 0   Entered and Authorized by:   Judith Part MD   Signed by:   Lewanda Rife LPN on 09/81/1914   Method used:   Telephoned to ...       [facility]* (retail)       [address]       [place], [place]  [address]       Ph: [phone]       Fax: [phone]   RxID:   [id number]

## 2010-11-19 NOTE — Miscellaneous (Signed)
Summary: Controlled Substance Agreement  Controlled Substance Agreement   Imported By: Lanelle Bal 05/05/2010 13:18:38  _____________________________________________________________________  External Attachment:    Type:   Image     Comment:   External Document

## 2010-12-07 ENCOUNTER — Telehealth: Payer: Self-pay | Admitting: Family Medicine

## 2010-12-08 ENCOUNTER — Encounter: Payer: Self-pay | Admitting: Family Medicine

## 2010-12-09 ENCOUNTER — Telehealth: Payer: Self-pay | Admitting: Family Medicine

## 2010-12-14 NOTE — Progress Notes (Signed)
Summary: refill request for tramadol  Phone Note Refill Request Message from:  Fax from Pharmacy  Refills Requested: Medication #1:  ULTRAM 50 MG TABS 1-2 by mouth three times a day as needed pain F[facility]ed request from [facility] is on your shelf.  Initial call taken by: Lowella Petties CMA, AAMA,  December 07, 2010 5:10 PM  Follow-up for Phone Call        form done and in nurse in box  Follow-up by: Judith Part MD,  December 07, 2010 5:15 PM  Additional Follow-up for Phone Call Additional follow up Details #1::        Faxed and scanned. Additional Follow-up by: Delilah Shan CMA (AAMA),  December 08, 2010 8:04 AM    Prescriptions: ULTRAM 50 MG TABS (TRAMADOL HCL) 1-2 by mouth three times a day as needed pain  #180 x 3   Entered and Authorized by:   Judith Part MD   Signed by:   Judith Part MD on 12/07/2010   Method used:   Printed then faxed to ...       [facility]* (retail)       [address]       [place], [place]  [address]       Ph: [phone]       Fax: [phone]   RxID:   [id number]

## 2010-12-14 NOTE — Progress Notes (Signed)
Summary: refill request for ambien  Phone Note Refill Request Message from:  Patient  Refills Requested: Medication #1:  AMBIEN 10 MG TABS 1 by mouth at bedtime as needed insomnia. [facility] [place] [place], [phone].  Pt is going out of town tomorrow, will be gone several days.  Initial call taken by: Lowella Petties CMA, AAMA,  December 09, 2010 12:49 PM  Follow-up for Phone Call        px written on EMR for call in  Follow-up by: Judith Part MD,  December 09, 2010 1:34 PM  Additional Follow-up for Phone Call Additional follow up Details #1::        [facility], [facility] pt.  Additional Follow-up by: Lowella Petties CMA, AAMA,  December 09, 2010 2:52 PM    Prescriptions: AMBIEN 10 MG TABS (ZOLPIDEM TARTRATE) 1 by mouth at bedtime as needed insomnia  #30 x 0   Entered and Authorized by:   Judith Part MD   Signed by:   Judith Part MD on 12/09/2010   Method used:   Telephoned to ...       [facility]* (retail)       [address]       [place], [place]  [address]       Ph: [phone]       Fax: [phone]   RxID:   [id number]

## 2010-12-23 NOTE — Medication Information (Signed)
Summary: Refill Request for Tramadol  Refill Request for Tramadol   Imported By: Maryln Gottron 12/13/2010 12:22:20  _____________________________________________________________________  External Attachment:    Type:   Image     Comment:   External Document

## 2011-01-10 ENCOUNTER — Telehealth: Payer: Self-pay

## 2011-01-10 MED ORDER — SUMATRIPTAN SUCCINATE 100 MG PO TABS: 100.0000 mg | ORAL_TABLET | Freq: Once | ORAL | Status: DC | PRN

## 2011-01-10 MED ORDER — ZOLPIDEM TARTRATE 10 MG PO TABS: 10.0000 mg | ORAL_TABLET | Freq: Every evening | ORAL | Status: DC | PRN

## 2011-01-10 NOTE — Telephone Encounter (Signed)
Medication phoned to [facility] [phone] pharmacy as instructed. Patient notified as instructed by v/m on cell telephone.

## 2011-01-10 NOTE — Telephone Encounter (Signed)
Pt ran out of Ambien over the weekend and also needs refill on Imitrex 100mg  be [facility] to [facility] [phone].Please advise.

## 2011-01-10 NOTE — Telephone Encounter (Signed)
meds px for call in

## 2011-02-07 ENCOUNTER — Telehealth: Payer: Self-pay

## 2011-02-07 MED ORDER — CYCLOBENZAPRINE HCL 10 MG PO TABS: 10.0000 mg | ORAL_TABLET | Freq: Three times a day (TID) | ORAL | Status: DC | PRN

## 2011-02-07 NOTE — Telephone Encounter (Signed)
Pt has had migraine and neck pain x 6 in the last 3 weeks. Pt said she thinks Tramadol is making the h/as worse. Pt has appt to see orthopedist in [place] 02/08/11. Pt is to see Dr Simon Rhein with [facility] in [place]. Pt has taken Percocet before and it makes her feel like she cannot function. Pt is taking Imitrex but does not seem to be helping pain. Pt wonders if there is another med Dr Milinda Antis could call in for pain for tonight until she can see the orthopedic dr tomorrow. Pt [facility] [place] [phone]. Pt can be reached at work at [phone].Please advise.

## 2011-02-07 NOTE — Telephone Encounter (Signed)
Left message on pt's cell to contact pharmacy about rx and also further instructions about medication. Medication phoned to [facility] [phone] pharmacy as instructed. Spoke with Greggory Stallion the pharmacist and left rx with add'l note for pt not to mix Flexeril with Ultram and to also f/u with orthopedist as planned.

## 2011-02-07 NOTE — Telephone Encounter (Signed)
The only thing I can think of is a mild muscle relaxer Can try flexeril Do not mix with ultram  Can call in to get through the night  F/u with ortho as planned  Px written for call in

## 2011-02-09 ENCOUNTER — Other Ambulatory Visit: Payer: Self-pay

## 2011-02-09 MED ORDER — ZOLPIDEM TARTRATE 10 MG PO TABS: 10.0000 mg | ORAL_TABLET | Freq: Every evening | ORAL | Status: DC | PRN

## 2011-02-09 NOTE — Telephone Encounter (Signed)
Px written for call in   

## 2011-02-09 NOTE — Telephone Encounter (Signed)
Pt request refill on Ambien 10mg  called in to [facility] [place] [place] [phone]. Pt also wanted Dr Milinda Antis to know she went to see orthopedist and he is scheduling her for an injection (pt not sure what type injection) and orthopedist did not change any of her meds and will send you copy of office notes.

## 2011-02-09 NOTE — Telephone Encounter (Signed)
Patient notified as instructed by telephone. Medication phoned [facility] [place] pharmacy as instructed.

## 2011-02-22 ENCOUNTER — Telehealth: Payer: Self-pay | Admitting: *Deleted

## 2011-02-22 MED ORDER — CYCLOBENZAPRINE HCL 10 MG PO TABS: 10.0000 mg | ORAL_TABLET | Freq: Three times a day (TID) | ORAL | Status: AC | PRN

## 2011-02-22 NOTE — Telephone Encounter (Signed)
Pt is asking for refill on flexeril.  She says this really helps head and neck pain, so much better than tramodol.  [facility] [place] [place],   [phone].  She is still waiting to hear on when she can get an injection, her ortho office is working on this.

## 2011-02-22 NOTE — Telephone Encounter (Signed)
Px written for call in  For flexeril Do not use the tramadol

## 2011-02-22 NOTE — Telephone Encounter (Signed)
Patient notified as instructed by telephone. [facility] [place] as instructed.

## 2011-03-04 NOTE — Assessment & Plan Note (Signed)
[facility]                                 ON-CALL NOTE   CHARLANE, WESTRY                        MRN:          [id number]  DATE:09/16/2006                            DOB:          [birth year]    PRIMARY CARE DOCTOR:  Atha Starks. Bean, FNP.   Phone number [phone].   OBJECTIVE:  The patient has been having a migraine for about 12 hours  that is progressively getting worse and she does have some nausea,  although no vomiting.  He does have a history of migraines and has  Maxalt and Relpax available.  She has already taken 2 of Maxalt and  Naprosyn and has not helped.  She is requesting a prescription for  Toradol which helped significantly in the past.   ASSESSMENT AND PLAN:  I called Toradol 10 mg p.o. q.6 h. p.r.n.  headache, #5, zero refills.     Kerby Nora, MD  Electronically Signed    AB/MedQ  DD: 09/16/2006  DT: 09/18/2006  Job #: [id number]

## 2011-03-04 NOTE — Op Note (Signed)
Laurie [name], [name]                       ACCOUNT NO.:  [id number]   MEDICAL RECORD NO.:  [id number]                   PATIENT TYPE:  INP   LOCATION:  9399                                 FACILITY:  [facility]   PHYSICIAN:  Cynthia P. Romine, M.D.             DATE OF BIRTH:  1966-03-05   DATE OF PROCEDURE:  12/01/2003  DATE OF DISCHARGE:                                 OPERATIVE REPORT   PREOPERATIVE DIAGNOSES:  1. Menorrhagia.  2. Dysmenorrhea.  3. Recurrent endometriosis.   POSTOPERATIVE DIAGNOSES:  1. Menorrhagia.  2. Dysmenorrhea.  3. Recurrent endometriosis.  4. Pathology pending.   PROCEDURE:  Total abdominal hysterectomy.   SURGEON:  Cynthia P. Romine, M.D.   ASSISTANT:  Andres Ege, M.D.   ANESTHESIA:  General endotracheal.   ESTIMATED BLOOD LOSS:  150 mL.   COMPLICATIONS:  None.   DESCRIPTION OF PROCEDURE:  The patient was taken to the operating room and  after the induction of adequate general endotracheal anesthesia was prepped  and draped in the usual fashion and Foley catheter inserted.  A Pfannenstiel  incision was made and carried down to the fascia using the Bovie.  The  fascia was nicked and opened transversely.  Kochers were used to grasp the  fascial margins, and it was separated from the underlying rectus muscles  with the Bovie.  The rectus muscles were separated bluntly in the midline.  The underlying peritoneum was elevated between hemostats and entered  atraumatically.  The peritoneum was opened vertically.  The abdomen was  explored.  The liver edge was smooth.  There was no periaortic or pelvic  adenopathy.  In the pelvis the uterus was of a normal size and was freely  mobile.  There was evidence of a previous bilateral tubal sterilization  procedure.  Both ovaries and both tubes appeared normal.  In the cul-de-sac  there were some adhesions and some window formation consistent with her  known endometriosis.  The anterior cul-de-sac was  normal.  A Balfour  retractor was placed.  The bowel was packed away with wet laps.  The uterus  was grasped at the cornua with long Kellys.  The round ligaments were  sutured and divided with the Bovie.  The anterior leaf of the broad ligament  was taken down sharply.  A window was created so that the pedicle containing  the utero-ovarian ligament, the tube, and the round ligament could be  clamped, cut, and tied.  The bladder was taken down sharply.  The uterine  arteries were skeletonized on both sides, clamped, cut, and doubly tied wit  0 chromic.  The procedure continued down the cardinal ligaments, clamping,  cutting, and tying in sequence.  The uterosacral ligaments were taken as a  separate pedicle.  An opening into the vagina was made when the uterosacral  ligament was clamped on the patient's left.  The Satinsky scissors were used  to remove  the specimen.  The vaginal cuff was held with Kochers.  Angle  sutures were placed at each of the right and left sides of the vagina.  The  vagina was then closed with interrupted figure-of-eight sutures of 0  chromic.  The pelvis was then irrigated.  There was some scant bleeding from  the bladder where it had been separated off the cervix.  This was gently  cauterized with good success.  The ovaries were tied up to the round  ligaments to prevent later dyspareunia.  The pelvis was again irrigated and  inspected and felt to be hemostatic.  The packs were removed.  The retractor  was removed.  The parietal peritoneum was closed in a running fashion using  0 chromic.  An AcuFuser catheter was then inserted through the abdominal  wall on the patient's right into the subfascial space.  The fascia was then  closed in a running fashion using 0 Vicryl going from each angle toward the  midline.  Lidocaine 1% 20 mL was then slowly infused into the subfascial  space.  The subcutaneous AcuFuser catheter was then inserted into the  subcutaneous space.   Bleeding was controlled with the Bovie.  The space was  irrigated and then the incision was closed subcuticularly with 4-0 Vicryl  Rapide.  Benzoin and Steri-Strips were applied over the incision.  Lidocaine  1% 10 mL was used to be infused slowly into the subcutaneous space.  A  dressing was applied and the procedure was terminated.  The patient  tolerated it well and went in satisfactory condition to postanesthesia  recovery.                                               Cynthia P. Romine, M.D.    CPR/MEDQ  D:  12/01/2003  T:  12/01/2003  Job:  [id number]

## 2011-03-11 ENCOUNTER — Other Ambulatory Visit: Payer: Self-pay

## 2011-03-11 MED ORDER — ZOLPIDEM TARTRATE 10 MG PO TABS: 10.0000 mg | ORAL_TABLET | Freq: Every evening | ORAL | Status: DC | PRN

## 2011-03-11 NOTE — Telephone Encounter (Signed)
Dr. Milinda Antis had already signed off on the computer but pt has not had refill since 02/09/11 so Dr Milinda Antis said OK to refill for #30. [facility] [place] [phone] pharmacy as instructed. Patient notified as instructed by telephone.

## 2011-04-11 ENCOUNTER — Other Ambulatory Visit: Payer: Self-pay | Admitting: *Deleted

## 2011-04-11 MED ORDER — ZOLPIDEM TARTRATE 10 MG PO TABS: 10.0000 mg | ORAL_TABLET | Freq: Every evening | ORAL | Status: DC | PRN

## 2011-04-11 NOTE — Telephone Encounter (Signed)
[facility]

## 2011-04-26 ENCOUNTER — Other Ambulatory Visit: Payer: Self-pay | Admitting: *Deleted

## 2011-04-26 MED ORDER — TRAMADOL HCL 50 MG PO TABS: ORAL_TABLET | ORAL | Status: DC

## 2011-04-26 NOTE — Telephone Encounter (Signed)
Faxed form from pharmacy is on your desk.

## 2011-04-26 NOTE — Telephone Encounter (Signed)
refil done- form in IN box

## 2011-04-27 NOTE — Telephone Encounter (Signed)
Refill called to [facility].

## 2011-05-09 ENCOUNTER — Telehealth: Payer: Self-pay | Admitting: *Deleted

## 2011-05-09 ENCOUNTER — Other Ambulatory Visit: Payer: Self-pay | Admitting: *Deleted

## 2011-05-09 MED ORDER — ZOLPIDEM TARTRATE 10 MG PO TABS: 10.0000 mg | ORAL_TABLET | Freq: Every evening | ORAL | Status: DC | PRN

## 2011-05-09 NOTE — Telephone Encounter (Signed)
Opened in error

## 2011-05-09 NOTE — Telephone Encounter (Signed)
Px printed for pick up in IN box  Pt already coming for pick up now

## 2011-05-09 NOTE — Telephone Encounter (Signed)
Patient would like to pick up written rx today. Please call when rx is ready.

## 2011-05-09 NOTE — Telephone Encounter (Signed)
Px written for call in   

## 2011-05-10 NOTE — Telephone Encounter (Signed)
Pt signed for rxs at front desk.

## 2011-05-31 ENCOUNTER — Telehealth: Payer: Self-pay

## 2011-05-31 MED ORDER — HYOSCYAMINE SULFATE 0.125 MG SL SUBL: 0.1250 mg | SUBLINGUAL_TABLET | Freq: Four times a day (QID) | SUBLINGUAL | Status: AC | PRN

## 2011-05-31 NOTE — Telephone Encounter (Signed)
Patient notified as instructed by telephone. Med[facility] [place] pharmacy as instructed.

## 2011-05-31 NOTE — Telephone Encounter (Signed)
Pt left v/m that she is going on cruise tomorrow and will be gone for 1 week. Pt said since not taking Tramadol IBS tends to flair up.Pt wants med called in case IBS flairs up on trip. Pt said had been given Belladonna in the past that worked. Pt contact # is [phone]. Pt did not leave a pharmacy. Unable to reach pt by phone. Left v/m for pt to call back.

## 2011-05-31 NOTE — Telephone Encounter (Signed)
I will try levsin for the IBS  Px written for call in  If you can reach her with a pharmacy

## 2011-06-09 ENCOUNTER — Other Ambulatory Visit: Payer: Self-pay | Admitting: *Deleted

## 2011-06-09 MED ORDER — ZOLPIDEM TARTRATE 10 MG PO TABS: 10.0000 mg | ORAL_TABLET | Freq: Every evening | ORAL | Status: DC | PRN

## 2011-06-09 NOTE — Telephone Encounter (Signed)
Pt is asking for refill today, she is out of medicine.

## 2011-06-09 NOTE — Telephone Encounter (Signed)
Ok to refill.  plz phone in  

## 2011-06-09 NOTE — Telephone Encounter (Signed)
Medicine called to [facility].

## 2011-07-08 ENCOUNTER — Other Ambulatory Visit: Payer: Self-pay | Admitting: *Deleted

## 2011-07-08 MED ORDER — HYOSCYAMINE SULFATE 0.125 MG SL SUBL: 0.1250 mg | SUBLINGUAL_TABLET | SUBLINGUAL | Status: AC | PRN

## 2011-07-08 MED ORDER — ZOLPIDEM TARTRATE 10 MG PO TABS: 10.0000 mg | ORAL_TABLET | Freq: Every evening | ORAL | Status: DC | PRN

## 2011-07-08 NOTE — Telephone Encounter (Signed)
Medication phoned to pharmacy.  

## 2011-07-08 NOTE — Telephone Encounter (Signed)
Pt is also asking for refill on levsin.  You have given her this in the past and it helped with her spams.

## 2011-07-08 NOTE — Telephone Encounter (Signed)
Px written for call in   

## 2011-07-28 ENCOUNTER — Other Ambulatory Visit: Payer: Self-pay | Admitting: *Deleted

## 2011-07-28 MED ORDER — KETOROLAC TROMETHAMINE 10 MG PO TABS: 10.0000 mg | ORAL_TABLET | Freq: Four times a day (QID) | ORAL | Status: AC | PRN

## 2011-07-28 NOTE — Telephone Encounter (Signed)
Rx called in as directed. Patient made aware and was advised she needed to make a follow up appt. She said she would have to call back and schedule once she knows her work schedule.

## 2011-07-28 NOTE — Telephone Encounter (Signed)
Ok to refill #10 but will need to come in for appointment as hasn't been seen in over 1 year. Will route to PCP.

## 2011-07-28 NOTE — Telephone Encounter (Signed)
Pt is asking for a refill on toradol.  This is no longer on med list.  Pt states she doesn't get refills very often but she is going out of town this evening and wants to take it with her.  [facility] [place]

## 2011-09-19 ENCOUNTER — Other Ambulatory Visit: Payer: Self-pay | Admitting: Family Medicine

## 2011-09-20 NOTE — Telephone Encounter (Signed)
Left v/m for pt to call back. Tried work # and could not reach.

## 2011-09-20 NOTE — Telephone Encounter (Signed)
I don't think she has been here in over a year (correct me if I am wrong) sched f/u Then can refil med - phone in or elect  thanks

## 2011-09-23 ENCOUNTER — Telehealth: Payer: Self-pay | Admitting: Internal Medicine

## 2011-09-23 NOTE — Telephone Encounter (Signed)
Opened in error

## 2011-09-24 ENCOUNTER — Other Ambulatory Visit: Payer: Self-pay | Admitting: Family Medicine

## 2011-09-26 NOTE — Telephone Encounter (Signed)
Patient notified as instructed by telephone. Pt scheduled appt with Dr Milinda Antis 10/21/11 12noon. Medication phoned to [facility] [phone] pharmacy as instructed.

## 2011-09-26 NOTE — Telephone Encounter (Signed)
Have left messages for pt to call back to schedule appt. Spoke with pt today and med called in.

## 2011-09-26 NOTE — Telephone Encounter (Signed)
Didn't I just do this on 12/3?

## 2011-09-26 NOTE — Telephone Encounter (Signed)
[facility] in [place] request refill Imitrex 100 mg. Last filled 04/18/11 and pt last seen 04/28/10.

## 2011-10-06 ENCOUNTER — Other Ambulatory Visit: Payer: Self-pay | Admitting: Family Medicine

## 2011-10-06 MED ORDER — ZOLPIDEM TARTRATE 10 MG PO TABS: 10.0000 mg | ORAL_TABLET | Freq: Every evening | ORAL | Status: DC | PRN

## 2011-10-06 NOTE — Telephone Encounter (Signed)
Spoke with [employer].  And has scheduled yearly for meds however she is running out of [place] and needs it for the holidays. Can it be called in?  Pt call back is [phone]

## 2011-10-06 NOTE — Telephone Encounter (Signed)
Px written for call in   

## 2011-10-07 NOTE — Telephone Encounter (Signed)
Medication phoned to [facility] [place] pharmacy as instructed.Patient notified as instructed by cell phone v/m.

## 2011-10-20 ENCOUNTER — Encounter: Payer: Self-pay | Admitting: Family Medicine

## 2011-10-21 ENCOUNTER — Ambulatory Visit: Payer: Self-pay | Admitting: Family Medicine

## 2012-04-27 DIAGNOSIS — N83209 Unspecified ovarian cyst, unspecified side: Secondary | ICD-10-CM

## 2012-04-27 DIAGNOSIS — R5383 Other fatigue: Secondary | ICD-10-CM

## 2012-04-27 HISTORY — DX: Unspecified ovarian cyst, unspecified side: N83.209

## 2012-04-27 HISTORY — DX: Other fatigue: R53.83

## 2012-07-12 ENCOUNTER — Other Ambulatory Visit: Payer: Self-pay | Admitting: Family Medicine

## 2012-07-12 NOTE — Telephone Encounter (Signed)
Ok to refill, no recent appt, no labs, an no future appt

## 2012-07-12 NOTE — Telephone Encounter (Signed)
Please make her an appt to f/u in jan Can refil med until then thanks

## 2012-07-13 NOTE — Telephone Encounter (Signed)
Pt moved to [place]. was suppose to send the Rx to her new PCP in [place] she didn't need [place] to refill it

## 2014-02-10 ENCOUNTER — Telehealth: Payer: Self-pay | Admitting: Family Medicine

## 2014-02-10 ENCOUNTER — Other Ambulatory Visit: Payer: Self-pay | Admitting: *Deleted

## 2014-02-10 ENCOUNTER — Encounter: Payer: Self-pay | Admitting: Internal Medicine

## 2014-02-10 ENCOUNTER — Ambulatory Visit (INDEPENDENT_AMBULATORY_CARE_PROVIDER_SITE_OTHER)
Admission: RE | Admit: 2014-02-10 | Discharge: 2014-02-10 | Disposition: A | Discharge status: Home / Self Care | Payer: 59 | Source: Ambulatory Visit | Attending: Internal Medicine | Admitting: Internal Medicine

## 2014-02-10 ENCOUNTER — Ambulatory Visit (INDEPENDENT_AMBULATORY_CARE_PROVIDER_SITE_OTHER): Payer: 59 | Admitting: Internal Medicine

## 2014-02-10 VITALS — BP 130/88 | HR 72 | Temp 98.0°F | Wt 117.5 lb

## 2014-02-10 DIAGNOSIS — R29810 Facial weakness: Secondary | ICD-10-CM

## 2014-02-10 DIAGNOSIS — G43509 Persistent migraine aura without cerebral infarction, not intractable, without status migrainosus: Secondary | ICD-10-CM

## 2014-02-10 DIAGNOSIS — M542 Cervicalgia: Secondary | ICD-10-CM

## 2014-02-10 MED ORDER — METHYLPREDNISOLONE ACETATE 40 MG/ML IJ SUSP
80.0000 mg | Freq: Once | INTRAMUSCULAR | Status: AC
  Administered 2014-02-10: 80 mg via INTRAMUSCULAR

## 2014-02-10 MED ORDER — KETOROLAC TROMETHAMINE 60 MG/2ML IM SOLN
60.0000 mg | Freq: Once | INTRAMUSCULAR | Status: AC
  Administered 2014-02-10: 60 mg via INTRAMUSCULAR

## 2014-02-10 MED ORDER — BUTALBITAL-APAP-CAFFEINE 50-325-40 MG PO TABS: 1.0000 | ORAL_TABLET | Freq: Two times a day (BID) | ORAL | Status: DC | PRN

## 2014-02-10 NOTE — Addendum Note (Signed)
Addended by: Lurlean Nanny on: 02/10/2014 04:51 PM   Modules accepted: Orders

## 2014-02-10 NOTE — Telephone Encounter (Signed)
Patient advised. Medication phoned to [facility] [address] per patient request.

## 2014-02-10 NOTE — Progress Notes (Signed)
Pre visit review using our clinic review tool, if applicable. No additional management support is needed unless otherwise documented below in the visit note. 

## 2014-02-10 NOTE — Telephone Encounter (Signed)
Message copied by Abner Greenspan on Mon Feb 10, 2014  5:32 PM ------      Message from: Lurlean Nanny      Created: Mon Feb 10, 2014  3:34 PM       Pt is aware of results as instructed--However pt states she is still in a lot of pain and wanted to know if anything could be called into pharmacy--Regina Garnette Gunner is out of the office for the rest of day and she wants to know as soon as possible if there is something she can take--please advise ------

## 2014-02-10 NOTE — Patient Instructions (Addendum)
Migraine Headache A migraine headache is an intense, throbbing pain on one or both sides of your head. A migraine can last for 30 minutes to several hours. CAUSES  The exact cause of a migraine headache is not always known. However, a migraine may be caused when nerves in the brain become irritated and release chemicals that cause inflammation. This causes pain. Certain things may also trigger migraines, such as:  Alcohol.  Smoking.  Stress.  Menstruation.  Aged cheeses.  Foods or drinks that contain nitrates, glutamate, aspartame, or tyramine.  Lack of sleep.  Chocolate.  Caffeine.  Hunger.  Physical exertion.  Fatigue.  Medicines used to treat chest pain (nitroglycerine), birth control pills, estrogen, and some blood pressure medicines. SIGNS AND SYMPTOMS  Pain on one or both sides of your head.  Pulsating or throbbing pain.  Severe pain that prevents daily activities.  Pain that is aggravated by any physical activity.  Nausea, vomiting, or both.  Dizziness.  Pain with exposure to bright lights, loud noises, or activity.  General sensitivity to bright lights, loud noises, or smells. Before you get a migraine, you may get warning signs that a migraine is coming (aura). An aura may include:  Seeing flashing lights.  Seeing bright spots, halos, or zig-zag lines.  Having tunnel vision or blurred vision.  Having feelings of numbness or tingling.  Having trouble talking.  Having muscle weakness. DIAGNOSIS  A migraine headache is often diagnosed based on:  Symptoms.  Physical exam.  A CT scan or MRI of your head. These imaging tests cannot diagnose migraines, but they can help rule out other causes of headaches. TREATMENT Medicines may be given for pain and nausea. Medicines can also be given to help prevent recurrent migraines.  HOME CARE INSTRUCTIONS  Only take over-the-counter or prescription medicines for pain or discomfort as directed by your  health care provider. The use of long-term narcotics is not recommended.  Lie down in a dark, quiet room when you have a migraine.  Keep a journal to find out what may trigger your migraine headaches. For example, write down:  What you eat and drink.  How much sleep you get.  Any change to your diet or medicines.  Limit alcohol consumption.  Quit smoking if you smoke.  Get 7 9 hours of sleep, or as recommended by your health care provider.  Limit stress.  Keep lights dim if bright lights bother you and make your migraines worse. SEEK IMMEDIATE MEDICAL CARE IF:   Your migraine becomes severe.  You have a fever.  You have a stiff neck.  You have vision loss.  You have muscular weakness or loss of muscle control.  You start losing your balance or have trouble walking.  You feel faint or pass out.  You have severe symptoms that are different from your first symptoms. MAKE SURE YOU:   Understand these instructions.  Will watch your condition.  Will get help right away if you are not doing well or get worse. Document Released: 10/03/2005 Document Revised: 07/24/2013 Document Reviewed: 06/10/2013 [employer] Patient Information 2014 [employer].  

## 2014-02-10 NOTE — Telephone Encounter (Signed)
It looks like she had a shot of toradol and also some depo medrol - that may take a bit of time to work fully  Please call in fiorcet to use as needed - will see if that helps a bit more  Of course go to ER if symptoms suddenly worsen  Make an appt for f/u as planned

## 2014-02-10 NOTE — Progress Notes (Signed)
Subjective:    Patient ID: Laurie [name], female    DOB: [birth year], 48 y.o.   MRN: [id number]  HPI  Pt presents to the clinic today with c/o neck pain, facial spasms and migraine. She has a hx of migraines but they have gotten much worse over the last 3 months and are now presenting with an aura. She reports this latest migraine/neck pain started on Friday. Pt rates the pain as severe; 10/10. The facial spasms occur around her jaw and under her left eye. She has not been seen here in the last 5 years. She has been getting her care from the wellness center in her work. The wellness center thought she may be having rebound headaches from the imitrex so they stopped it and put her on maxalt. She has been on daily migraine prophylaxis some years ago but cannot remember what drug it was. Pt admits to blurry vision, nausea and vomiting with these headaches. Pt denies difficulty with her gait and speech.   Review of Systems      Past Medical History  Diagnosis Date  . Migraine   . Chronic neck pain     from disc dz  . Anxiety     situational  . Insomnia     organic sleep disorder, failed multiple meds  . IBS (irritable bowel syndrome)     diarrhea predominant    Current Outpatient Prescriptions  Medication Sig Dispense Refill  . ketorolac (TORADOL) 10 MG tablet Take 10 mg by mouth every 6 (six) hours as needed.        . promethazine (PHENERGAN) 25 MG tablet Take 25 mg by mouth 3 (three) times daily as needed.        . rizatriptan (MAXALT) 5 MG tablet Take 5 mg by mouth as needed for migraine. May repeat in 2 hours if needed      . zolpidem (AMBIEN) 10 MG tablet Take 1 tablet (10 mg total) by mouth at bedtime as needed.  30 tablet  0   No current facility-administered medications for this visit.    Allergies  Allergen Reactions  . Codeine     REACTION: vomiting  . Codeine Phosphate     REACTION: vomiting  . Penicillins     REACTION: urticaria (hives)    Family History    Problem Relation Age of Onset  . COPD Father   . Heart disease Father     CAD  . Heart disease Other     CAD  . Heart disease Other     massive MI    History   Social History  . Marital Status: Divorced    Spouse Name: N/A    Number of Children: N/A  . Years of Education: N/A   Occupational History  . Not on file.   Social History Main Topics  . Smoking status: Never Smoker   . Smokeless tobacco: Not on file  . Alcohol Use: Yes     Comment: occ alcohol-very rare if any  . Drug Use: Not on file  . Sexual Activity: Not on file   Other Topics Concern  . Not on file   Social History Narrative   Has a boyfriend     Constitutional: Pt admits to severe HA on left side of head. Denies fever, malaise, fatigue, or abrupt weight changes.  HEENT: Pt admits to blurry vision. Denies eye pain, eye redness, ear pain, ringing in the ears, wax buildup, runny nose, nasal congestion, bloody nose,  or sore throat. Respiratory: Denies difficulty breathing, shortness of breath, cough or sputum production.   Cardiovascular: Denies chest pain, chest tightness, palpitations or swelling in the hands or feet.  Gastrointestinal: Pt admits to nausea and vomiting. Denies abdominal pain, bloating, constipation, diarrhea or blood in the stool.  Neurological: Denies dizziness, difficulty with memory, difficulty with speech or problems with balance and coordination.   No other specific complaints in a complete review of systems (except as listed in HPI above).  Objective:   Physical Exam   BP 130/88  Pulse 72  Temp(Src) 98 F (36.7 C) (Oral)  Wt 117 lb 8 oz (53.298 kg)  SpO2 99% Wt Readings from Last 3 Encounters:  02/10/14 117 lb 8 oz (53.298 kg)  04/28/10 117 lb (53.071 kg)  01/26/09 118 lb (53.524 kg)    General: Appears her stated age, tearful.  HEENT: Head: normal shape and size; Eyes: sclera white, no icterus, conjunctiva pink, PERRLA and EOMs intact Cardiovascular: Normal rate  and rhythm. S1,S2 noted.  No murmur, rubs or gallops noted. No JVD or BLE edema. No carotid bruits noted. Pulmonary/Chest: Normal effort and positive vesicular breath sounds. No respiratory distress. No wheezes, rales or ronchi noted.  Musculoskeletal: Normal range of motion on neck but decreased strength on left side. No difficulty with gait. 5/5 strength b/l upper and lower extremities. Neurological: Alert and oriented. Left sided facial droop noted. Unable to puff out left cheek and asymmetrical smile.  Coordination normal.     Assessment & Plan:   Headache, neck pain, facial spasm, left sided facial droop:   Will obtain CT head to evaluate worsening recurrent migraines and to r/o TIA/CVA   If normal, will start on migraine prophylaxis such as Topamax   Gave 60 mg Toradol injection in office for pain   Gave 80 mg Depo injection in office for inflammation and neck spasms  Advised her to f/u in 2 weeks with Dr. Glori Bickers and make new pt appointment as well

## 2014-02-17 ENCOUNTER — Other Ambulatory Visit: Payer: Self-pay

## 2014-02-17 MED ORDER — BUTALBITAL-APAP-CAFFEINE 50-325-40 MG PO TABS: 1.0000 | ORAL_TABLET | Freq: Two times a day (BID) | ORAL | Status: DC | PRN

## 2014-02-17 NOTE — Telephone Encounter (Signed)
She was given a week supply 1 week ago - and has appt on 5/6  I will write for another short supply to call in if she needs it before then Px written for call in

## 2014-02-17 NOTE — Telephone Encounter (Signed)
Pt left v/m requesting refill fioricet for H/A; pt has appt with Dr Glori Bickers on 02/19/14 5:15 pm. Pt request cb.

## 2014-02-18 ENCOUNTER — Other Ambulatory Visit: Payer: Self-pay | Admitting: Family Medicine

## 2014-02-18 NOTE — Telephone Encounter (Deleted)
Electronic refill request, please advise  

## 2014-02-18 NOTE — Telephone Encounter (Signed)
Rx was called into pharmacy this morning

## 2014-02-18 NOTE — Telephone Encounter (Signed)
Medication phoned to pharmacy. Tried to phone in on 02/17/14 afternoon but could not get through to pharmacy.

## 2014-02-19 ENCOUNTER — Encounter: Payer: Self-pay | Admitting: Family Medicine

## 2014-02-19 ENCOUNTER — Ambulatory Visit (INDEPENDENT_AMBULATORY_CARE_PROVIDER_SITE_OTHER): Payer: 59 | Admitting: Family Medicine

## 2014-02-19 VITALS — BP 120/78 | HR 69 | Temp 97.8°F | Ht 62.72 in | Wt 120.2 lb

## 2014-02-19 DIAGNOSIS — R519 Headache, unspecified: Secondary | ICD-10-CM

## 2014-02-19 DIAGNOSIS — R51 Headache: Secondary | ICD-10-CM

## 2014-02-19 HISTORY — DX: Headache, unspecified: R51.9

## 2014-02-19 MED ORDER — AMITRIPTYLINE HCL 10 MG PO TABS: ORAL_TABLET | ORAL | Status: DC

## 2014-02-19 MED ORDER — ZOLPIDEM TARTRATE 10 MG PO TABS: ORAL_TABLET | ORAL | Status: DC

## 2014-02-19 NOTE — Progress Notes (Signed)
Subjective:    Patient ID: Laurie [name], female    DOB: 1966/08/06, 48 y.o.   MRN: [id number]  HPI Here for migraine with aura  For the past 3 weeks - very frequent/daily - hard to get a break  Can barely work and fxn   Here and saw [employer] of toradol  Shot of depo medrol  Took several days for any improvement - then the headache finally eased up   CT of the head - negative   We called in fiorcet - that is helping more than anything  Has maxalt - not helping   She has been on ha prev med in the past - went to the [facility] center - very long time ago   She has had 3 herniated discs in neck  Was on lyrica-gained wt but worked  neurontin-not as effective -and also gave her trouble concentrating   Taking ambien to sleep - for about 10 years (has a sleep disorder)  Usually sleeps well with that  Headaches keep her from sleeping  In menopause  Was on testosterone - and stopped it due to side eff   She was on tricylic antidep (?elavil or nortrypt) in the past for depressed    Her aura is flashing lights and has had also opth ha  Patient Active Problem List   Diagnosis Date Noted  . DISORDERS, ORGANIC SLEEP NEC 06/06/2007  . [place] DISEASE 06/05/2007  . ANXIETY STATE NOS 04/04/2007  . MIGRAINE NOS W/O INTRACTABLE MIGRAINE 04/04/2007  . NECK PAIN, CHRONIC 04/04/2007   Past Medical History  Diagnosis Date  . Migraine   . Chronic neck pain     from disc dz  . Anxiety     situational  . Insomnia     organic sleep disorder, failed multiple meds  . IBS (irritable bowel syndrome)     diarrhea predominant   Past Surgical History  Procedure Laterality Date  . Knee surgery    . Epidural injections    . Deg c-s disk/spinal stenosis  06/2007   History  Substance Use Topics  . Smoking status: Never Smoker   . Smokeless tobacco: Never Used  . Alcohol Use: No   Family History  Problem Relation Age of Onset  . COPD Father   . Heart disease  Father     CAD  . Heart disease Other     CAD  . Heart disease Other     massive MI   Allergies  Allergen Reactions  . Codeine     REACTION: vomiting  . Codeine Phosphate     REACTION: vomiting  . Penicillins     REACTION: urticaria (hives)   Current Outpatient Prescriptions on File Prior to Visit  Medication Sig Dispense Refill  . butalbital-acetaminophen-caffeine (FIORICET, ESGIC) 50-325-40 MG per tablet Take 1 tablet by mouth 2 (two) times daily as needed for headache or migraine.  14 tablet  0  . ketorolac (TORADOL) 10 MG tablet Take 10 mg by mouth every 6 (six) hours as needed.        . promethazine (PHENERGAN) 25 MG tablet Take 25 mg by mouth 3 (three) times daily as needed.        . rizatriptan (MAXALT) 5 MG tablet Take 5 mg by mouth as needed for migraine. May repeat in 2 hours if needed      . zolpidem (AMBIEN) 10 MG tablet Take 1 tablet (10 mg total) by mouth at bedtime as  needed.  30 tablet  0   No current facility-administered medications on file prior to visit.    Review of Systems Review of Systems  Constitutional: Negative for fever, appetite change, fatigue and unexpected weight change.  Eyes: Negative for pain and visual disturbance. pos for aura to migraine Respiratory: Negative for cough and shortness of breath.   Cardiovascular: Negative for cp or palpitations    Gastrointestinal: Negative for nausea, diarrhea and constipation.  Genitourinary: Negative for urgency and frequency.  Skin: Negative for pallor or rash   Neurological: Negative for weakness, light-headedness, numbness and pos for headaches.  Hematological: Negative for adenopathy. Does not bruise/bleed easily.  Psychiatric/Behavioral: Negative for dysphoric mood. The patient is not nervous/anxious.         Objective:   Physical Exam  Constitutional: She is oriented to person, place, and time. She appears well-developed and well-nourished. No distress.  HENT:  Head: Normocephalic and  atraumatic.  Right Ear: External ear normal.  Left Ear: External ear normal.  Mouth/Throat: Oropharynx is clear and moist.  Nares are boggy No sinus tenderness No temporal tenderness   Eyes: Conjunctivae and EOM are normal. Pupils are equal, round, and reactive to light. Right eye exhibits no discharge. Left eye exhibits no discharge. No scleral icterus.  Neck: Normal range of motion. Neck supple. No JVD present. Carotid bruit is not present. No thyromegaly present.  Cardiovascular: Normal rate and regular rhythm.   Pulmonary/Chest: Effort normal and breath sounds normal. No respiratory distress. She has no wheezes. She has no rales.  Musculoskeletal: She exhibits no edema.  Lymphadenopathy:    She has no cervical adenopathy.  Neurological: She is alert and oriented to person, place, and time. She has normal reflexes. She displays no atrophy and no tremor. No cranial nerve deficit or sensory deficit. She exhibits normal muscle tone. Coordination and gait normal.  No cerebellar signs   DTRs are brisk  Skin: Skin is warm and dry. No rash noted. No erythema. No pallor.  Psychiatric: She has a normal mood and affect.          Assessment & Plan:

## 2014-02-19 NOTE — Patient Instructions (Signed)
Take amitriptyline 10 mg each evening for 1 week  Then if doing ok increase to 20 mg each evening for a week Then if doing ok increase to 30 mg each evening   (stop if you feel too sedated the next day)  Try to keep ambien at 1/2 pill and stop it if able  Drink water Gradually reduce caffeine  Use fiorcet only when needed   Follow up 3-4 weeks   Stop the medicine if you feel depressed or suicidal and let us know

## 2014-02-19 NOTE — Progress Notes (Signed)
Pre visit review using our clinic review tool, if applicable. No additional management support is needed unless otherwise documented below in the visit note. 

## 2014-02-20 NOTE — Assessment & Plan Note (Signed)
Migraine with aura - with triggers incl chronic neck pain/ spasm/ DDD, poss allergies, stressors, caffeine  For proph will try elavil-pt has taken tricyclic before and this may also help neck pain and sleep  ambien may worsen ha - this may enable her to get off of that - will start by cutting dose to 5 mg  Lifestyle habits disc  Will hold maxalt for now as these are daily and disc rebound potential Would like to get her off fiorcet asap with imp  Will titrate elavil from 10 to 30 as tol-disc side eff incl depression-will update >25 minutes spent in face to face time with patient, >50% spent in counselling or coordination of care F/u planned 3-4 wk

## 2014-02-24 ENCOUNTER — Other Ambulatory Visit: Payer: Self-pay

## 2014-02-24 ENCOUNTER — Other Ambulatory Visit: Payer: Self-pay | Admitting: Family Medicine

## 2014-02-24 MED ORDER — BUTALBITAL-APAP-CAFFEINE 50-325-40 MG PO TABS: 1.0000 | ORAL_TABLET | Freq: Two times a day (BID) | ORAL | Status: DC | PRN

## 2014-02-24 NOTE — Telephone Encounter (Signed)
Px written for call in   Thanks  

## 2014-02-24 NOTE — Telephone Encounter (Signed)
Pt left v/m requesting refill for fioricet; pt has one more days med of fioricet; pt said the other med for h/a may take a while to take effect and pt request cb when refilled.Please advise.

## 2014-02-25 NOTE — Telephone Encounter (Signed)
I think I refilled this yesterday.

## 2014-02-25 NOTE — Telephone Encounter (Signed)
Electronic refill request, please advise  

## 2014-02-25 NOTE — Telephone Encounter (Signed)
Rx was refilled and called in already, Rx declined

## 2014-02-25 NOTE — Telephone Encounter (Signed)
Rx called in as prescribed, pt notified 

## 2014-02-27 ENCOUNTER — Telehealth: Payer: Self-pay

## 2014-02-27 DIAGNOSIS — R519 Headache, unspecified: Secondary | ICD-10-CM

## 2014-02-27 DIAGNOSIS — R51 Headache: Principal | ICD-10-CM

## 2014-02-27 NOTE — Telephone Encounter (Signed)
I recommend neurology referral -either to headache center or other  I can mark it urgent - is that ok with her?

## 2014-02-27 NOTE — Telephone Encounter (Signed)
Pt left v/m; pt was seen 02/20/14 with h/a's and neck pain; pt has been taking Amitriptyline but is not helping h/a; pt cannot take Amitriptyline for 4 weeks and then reck due to severity of pain; pt wants to know if should have MRI. Pt request cb.

## 2014-02-27 NOTE — Telephone Encounter (Signed)
Pt advise of Dr. Marliss Coots comments. Pt does agree with referral to neurology, which ever one you feel she needs (HA or other), pt would like you to do referral as Urgent. I advise pt we will put referral in and Marion/Linda will call to schedule appt.

## 2014-02-27 NOTE — Telephone Encounter (Signed)
I will go ahead and try the headache center -I think she has been a pt there before  Will place urgent ref

## 2014-03-11 ENCOUNTER — Ambulatory Visit (INDEPENDENT_AMBULATORY_CARE_PROVIDER_SITE_OTHER): Payer: 59 | Admitting: Neurology

## 2014-03-11 ENCOUNTER — Encounter: Payer: Self-pay | Admitting: Neurology

## 2014-03-11 VITALS — BP 108/68 | HR 80 | Ht 64.57 in | Wt 123.4 lb

## 2014-03-11 DIAGNOSIS — R519 Headache, unspecified: Secondary | ICD-10-CM

## 2014-03-11 DIAGNOSIS — G4440 Drug-induced headache, not elsewhere classified, not intractable: Secondary | ICD-10-CM

## 2014-03-11 DIAGNOSIS — R51 Headache: Secondary | ICD-10-CM

## 2014-03-11 DIAGNOSIS — G43009 Migraine without aura, not intractable, without status migrainosus: Secondary | ICD-10-CM

## 2014-03-11 MED ORDER — TOPIRAMATE 25 MG PO TABS: ORAL_TABLET | ORAL | Status: DC

## 2014-03-11 NOTE — Progress Notes (Signed)
[facility] Clinic Note - Initial Visit   Date: 03/11/2014    Laurie [name] MRN: [id number] DOB: 27-Aug-1966   Dear Dr Laurie [name]:  Thank you for your kind referral of Laurie [name] for consultation of headaches. Although her history is well known to you, please allow [place] to reiterate it for the purpose of our medical record. The patient was accompanied to the clinic by self who also provides collateral information.     History of Present Illness: Laurie [name] is a 48 y.o. right-handed Caucasian female with history of migraines and insomnia presenting for evaluation of worsening headaches.    Headaches started in her early 53s after blunt head injury from weight-boarding.  There was no loss of consciousness.  She had MRI of the neck which showed mild disc degeneration.  Since then, headaches periodically worsened so received steroid injections to her neck which helped.  She also tried hormone replacement that helped her pain.  She has tried Lyrica in the past but it was stopped due to cognitive side effects. Imitrex did not work.  Usually, she would get a headache once every three months, lasting 1-2 hours if she was able to take toradol and maxalt.  Pain is located over the left temporal region, described as throbbing, and incapacitating.  She endorses significant photosensitivity, nausea, vomiting, and phonophobia.  She usually has associated neck tightness.  Rest improves headaches and is worsened by stress and exertion.  She tried neck physical therapy which exacerbated symptoms. No numbness/tingling, weakness, or vision changes.    Since early 2015, her headaches have become more frequent and more intense.  She reports starting her new job in January and although she really enjoys it, it is definitely a new stress.  Headaches are now occuring almost daily, with only 1-2 headache-free days per week since January 2015.  Pain is ranked as 8-9/10 when severe, and 5-6/10  daily. She saw her Laurie Cork, NP in April at which time CT head was normal and she was given toradol and depomedrol injection which did not help, so saw Dr. Glori [name] in early May who started her on elavil.  She feels that the headaches have improved since taking Elavil, because she has more headache-free days than pain days.  She is currently taking fioricet twice daily for the past two-months which has improved her headaches, but feels that if she does not take fioricet, headaches return.    Out-side paper records, electronic medical record, and images have been reviewed where available and summarized as:  CT head wo contrast 02/10/2014:  No focal acute intracranial abnormality identified.   Past Medical History  Diagnosis Date  . Migraine   . Chronic neck pain     from disc dz  . Anxiety     situational  . Insomnia     organic sleep disorder, failed multiple meds  . IBS (irritable bowel syndrome)     diarrhea predominant    Past Surgical History  Procedure Laterality Date  . Knee surgery    . Epidural injections    . Deg c-s disk/spinal stenosis  06/2007  . Abdominal hysterectomy       Medications:  Current Outpatient Prescriptions on File Prior to Visit  Medication Sig Dispense Refill  . amitriptyline (ELAVIL) 10 MG tablet Take 3 pills by mouth each evening  90 tablet  3  . butalbital-acetaminophen-caffeine (FIORICET, ESGIC) 50-325-40 MG per tablet Take 1 tablet by mouth 2 (two) times daily as needed for  headache or migraine.  30 tablet  0  . ketorolac (TORADOL) 10 MG tablet Take 10 mg by mouth every 6 (six) hours as needed.        . promethazine (PHENERGAN) 25 MG tablet Take 25 mg by mouth 3 (three) times daily as needed.        . rizatriptan (MAXALT) 5 MG tablet Take 5 mg by mouth as needed for migraine. May repeat in 2 hours if needed      . zolpidem (AMBIEN) 10 MG tablet Take 1/2 to 1 pill by mouth at bedtime as needed for sleep  30 tablet  3  . zolpidem (AMBIEN) 10 MG  tablet Take 1 tablet (10 mg total) by mouth at bedtime as needed for sleep.  30 tablet  0  . zolpidem (AMBIEN) 10 MG tablet Take 1 tablet (10 mg total) by mouth at bedtime as needed for sleep.  30 tablet  0  . zolpidem (AMBIEN) 10 MG tablet Take 1 tablet (10 mg total) by mouth at bedtime as needed for sleep.  30 tablet  2   No current facility-administered medications on file prior to visit.    Allergies:  Allergies  Allergen Reactions  . Codeine     REACTION: vomiting  . Codeine Phosphate     REACTION: vomiting  . Penicillins     REACTION: urticaria (hives)    Family History: Family History  Problem Relation Age of Onset  . COPD Father     Deceased, 55  . Heart disease Father     CAD  . Heart disease Other     CAD  . Heart disease Other     massive MI  . Migraines Father   . Migraines Brother   . Thyroid disease Mother     Living, 62  . Migraines Daughter     Social History: History   Social History  . Marital Status: Divorced    Spouse Name: N/A    Number of Children: N/A  . Years of Education: N/A   Occupational History  . Not on file.   Social History Main Topics  . Smoking status: Never Smoker   . Smokeless tobacco: Never Used  . Alcohol Use: No  . Drug Use: No  . Sexual Activity: Not on file   Other Topics Concern  . 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   She lives alone.     Highest level of education:  Some college    Review of Systems:  CONSTITUTIONAL: No fevers, chills, night sweats, or weight loss.   EYES: No visual changes or eye pain ENT: No hearing changes.  No history of nose bleeds.   RESPIRATORY: No cough, wheezing and shortness of breath.   CARDIOVASCULAR: Negative for chest pain, and palpitations.   GI: Negative for abdominal discomfort, blood in stools or black stools.  No recent change in bowel habits.   GU:  No history of incontinence.   MUSCLOSKELETAL: No history of  joint pain or swelling.  No myalgias.   SKIN: Negative for lesions, rash, and itching.   HEMATOLOGY/ONCOLOGY: Negative for prolonged bleeding, bruising easily, and swollen nodes.  No history of cancer.   ENDOCRINE: Negative for cold or heat intolerance, polydipsia or goiter.   PSYCH:  No depression or anxiety symptoms.   NEURO: As Above.   Vital Signs:  BP 108/68  Pulse 80  Ht 5' 4.57" (1.64  m)  Wt 123 lb 6 oz (55.963 kg)  BMI 20.81 kg/m2  SpO2 99% Pain Scale: 0 on a scale of 0-10   General Medical Exam:   General:  Well appearing, comfortable.   Eyes/ENT: see cranial nerve examination.   Neck: No masses appreciated.  Full range of motion without tenderness.  No carotid bruits. Respiratory:  Clear to auscultation, good air entry bilaterally.   Cardiac:  Regular rate and rhythm, no murmur.   GI:  Soft, non-tender, non-distended abdomen.  Bowel sounds normal. No masses, organomegaly.   Back:  No pain to palpation of spinous processes.   Extremities:  No deformities, edema, or skin discoloration. Good capillary refill.   Skin:  Skin color, texture, turgor normal. No rashes or lesions.  Neurological Exam: MENTAL STATUS including orientation to time, place, person, recent and remote memory, attention span and concentration, language, and fund of knowledge is normal.  Speech is not dysarthric.  CRANIAL NERVES: II:  No visual field defects.  Unremarkable fundi.   III-IV-VI: Pupils equal round and reactive to light.  Normal conjugate, extra-ocular eye movements in all directions of gaze.  No nystagmus.  No ptosis.   V:  Normal facial sensation.   VII:  Normal facial symmetry and movements.  No pathologic facial reflexes.  VIII:  Normal hearing and vestibular function.   IX-X:  Normal palatal movement.   XI:  Normal shoulder shrug and head rotation.   XII:  Normal tongue strength and range of motion, no deviation or fasciculation.  MOTOR:  No atrophy, fasciculations or abnormal  movements.  No pronator drift.  Tone is normal.    Right Upper Extremity:    Left Upper Extremity:    Deltoid  5/5   Deltoid  5/5   Biceps  5/5   Biceps  5/5   Triceps  5/5   Triceps  5/5   Wrist extensors  5/5   Wrist extensors  5/5   Wrist flexors  5/5   Wrist flexors  5/5   Finger extensors  5/5   Finger extensors  5/5   Finger flexors  5/5   Finger flexors  5/5   Dorsal interossei  5/5   Dorsal interossei  5/5   Abductor pollicis  5/5   Abductor pollicis  5/5   Tone (Ashworth scale)  0  Tone (Ashworth scale)  0   Right Lower Extremity:    Left Lower Extremity:    Hip flexors  5/5   Hip flexors  5/5   Hip extensors  5/5   Hip extensors  5/5   Knee flexors  5/5   Knee flexors  5/5   Knee extensors  5/5   Knee extensors  5/5   Dorsiflexors  5/5   Dorsiflexors  5/5   Plantarflexors  5/5   Plantarflexors  5/5   Toe extensors  5/5   Toe extensors  5/5   Toe flexors  5/5   Toe flexors  5/5   Tone (Ashworth scale)  0  Tone (Ashworth scale)  0   MSRs:  Right                                                                 Left brachioradialis 2+  brachioradialis 2+  biceps 2+  biceps 2+  triceps 2+  triceps 2+  patellar 2+  patellar 2+  ankle jerk 2+  ankle jerk 2+  Hoffman no  Hoffman no  plantar response down  plantar response down   SENSORY:  Normal and symmetric perception of light touch, pinprick, vibration, and proprioception.  Romberg's sign absent.   COORDINATION/GAIT: Normal finger-to- nose-finger and heel-to-shin.  Intact rapid alternating movements bilaterally.  Able to rise from a chair without using arms.  Gait narrow based and stable. Tandem and stressed gait intact.    IMPRESSION: Mr. Gotcher is a 48 year-old female presenting for evaluation of headaches.  Exam is non-focal.  CT brain has been reviewed and is within normal limits.  Based on her history and exam, I suspect that her recent stress related to her new job has triggered worsening headaches causing chronic  daily headaches.  Fortunately, her pain has improved since start elavil and Fioricet, but she is taking Fioricet daily and has most likely developed medication overuse headaches. Due to cognitive side effects, she is unable to tolerate greater than amitriptyline 20mg  daily.  I had a lengthy discussion regarding chronic daily headaches, migraines, and medication overuse headaches and the management of each.  First and foremost, I explained the importance of reducing how much rescue medication she takes to no more than 9-days per month.  To prevent headaches, I will switch her to topiramate and see if pain can be better controlled with this, side effects limit further titrating of amitriptyline.     PLAN/RECOMMENDATIONS:  1.  Start taking topiramate 25mg  daily  2.  Continue taking amitriptyline for two weeks, then reduce by 10mg  x 1 week, then stop. 3.  Start to reduce taking Fioricet to no more than twice weekly 4.  OK to use maxalt for severe headaches, no more than twice weekly.   5.  Do not use any rescue medication (ibuprofen, tylenol, toradol, maxalt) more than 9-days per month 6.  Return to clinic in 6-weeks    The duration of this appointment visit was 50 minutes of face-to-face time with the patient.  Greater than 50% of this time was spent in counseling, explanation of diagnosis, planning of further management, and coordination of care.   Thank you for allowing me to participate in patient's care.  If I can answer any additional questions, I would be pleased to do so.    Sincerely,    Donika K. Posey Pronto, DO

## 2014-03-11 NOTE — Patient Instructions (Addendum)
1.  Start taking topiramate 25mg  daily  2.  Continue taking amitriptyline for two weeks, then reduce by 10mg  x 1 week, then stop. 3.  Start to reduce taking Fioricet to no more than twice weekly 4.  OK to use maxalt for severe headaches, no more than twice weekly.   5.  Do not use any rescue medication (ibuprofen, tylenol, toradol, maxalt) more than 9-days per month 6.  Return to clinic in 6-weeks

## 2014-03-12 ENCOUNTER — Other Ambulatory Visit: Payer: Self-pay

## 2014-03-12 MED ORDER — BUTALBITAL-APAP-CAFFEINE 50-325-40 MG PO TABS: 1.0000 | ORAL_TABLET | Freq: Two times a day (BID) | ORAL | Status: DC | PRN

## 2014-03-12 NOTE — Telephone Encounter (Signed)
Ok-I rev neurology note -work on weaning as planned and I will refill once  Thanks  Px written for call in

## 2014-03-12 NOTE — Telephone Encounter (Signed)
Rx called in as prescribed   Pt notified Rx sent and to advise to work on weaning off Rx as planned, pt verbalized understanding

## 2014-03-12 NOTE — Telephone Encounter (Signed)
Pt left v/m; pt saw Dr Posey Pronto, neurologist on 03/11/14 and Dr Posey Pronto wants pt to come off the Fioricet over the next 2 weeks and just use fioricet as rescue med. Pt request Fioricet refill to [facility]. Pt has 1 pill left.Please advise.

## 2014-08-07 ENCOUNTER — Telehealth: Payer: Self-pay | Admitting: Family Medicine

## 2014-08-08 NOTE — Telephone Encounter (Signed)
Pt called back and rx is at [place] ready for pick up. Pt will ck with pharmacy.

## 2014-08-08 NOTE — Telephone Encounter (Signed)
Rx was called into pharmacy earlier today, called to tell pt and no answer and voicemail not set up

## 2014-08-08 NOTE — Telephone Encounter (Signed)
Rx called in as prescribed 

## 2014-08-08 NOTE — Telephone Encounter (Signed)
Pt left v/m requesting status of zolpidem refill. [phone] called and v/m not set up and no answer.

## 2014-08-08 NOTE — Telephone Encounter (Signed)
Electronic refill request, please advise  

## 2014-08-08 NOTE — Telephone Encounter (Signed)
Px written for call in   

## 2014-09-08 ENCOUNTER — Other Ambulatory Visit: Payer: Self-pay | Admitting: Family Medicine

## 2014-09-09 ENCOUNTER — Other Ambulatory Visit: Payer: Self-pay | Admitting: Family Medicine

## 2014-09-09 NOTE — Telephone Encounter (Signed)
Rx called in as prescribed 

## 2014-09-09 NOTE — Telephone Encounter (Signed)
Px written for call in   

## 2014-09-09 NOTE — Telephone Encounter (Signed)
Electronic refill request, please advise  

## 2014-09-19 ENCOUNTER — Ambulatory Visit (INDEPENDENT_AMBULATORY_CARE_PROVIDER_SITE_OTHER): Payer: 59 | Admitting: Neurology

## 2014-09-19 ENCOUNTER — Encounter: Payer: Self-pay | Admitting: Neurology

## 2014-09-19 VITALS — BP 130/80 | HR 63 | Ht 63.0 in | Wt 125.2 lb

## 2014-09-19 DIAGNOSIS — G43009 Migraine without aura, not intractable, without status migrainosus: Secondary | ICD-10-CM

## 2014-09-19 DIAGNOSIS — R51 Headache: Secondary | ICD-10-CM

## 2014-09-19 NOTE — Progress Notes (Signed)
Follow-up Visit   Date: 09/19/2014    Laurie [name] MRN: [id number] DOB: 12-Jul-1966   Interim History: Laurie [name] is a 48 y.o. right-handed Caucasian female with migraines and insomnia returning to the clinic for follow-up of headaches.  The patient was accompanied to the clinic by self.  History of present illness: Headaches started in her early 14s after blunt head injury from weight-boarding. There was no loss of consciousness. She had MRI of the neck which showed mild disc degeneration. Since then, headaches periodically worsened so received steroid injections to her neck which helped. She also tried hormone replacement that helped her pain. She has tried Lyrica in the past but it was stopped due to cognitive side effects. Imitrex did not work. Usually, she would get a headache once every three months, lasting 1-2 hours if she was able to take toradol and maxalt. Pain is located over the left temporal region, described as throbbing, and incapacitating. She endorses significant photosensitivity, nausea, vomiting, and phonophobia. She usually has associated neck tightness. Rest improves headaches and is worsened by stress and exertion. She tried neck physical therapy which exacerbated symptoms. No numbness/tingling, weakness, or vision changes.   Since early 2015, her headaches have become more frequent and more intense. She reports starting her new job in January and although she really enjoys it, it is definitely a new stress. Headaches are now occuring almost daily, with only 1-2 headache-free days per week since January 2015. Pain is ranked as 8-9/10 when severe, and 5-6/10 daily. She saw her Marshall Cork, NP in April at which time CT head was normal and she was given toradol and depomedrol injection which did not help, so saw Dr. Glori Bickers in early May who started her on elavil. She feels that the headaches have improved since taking Elavil, because she has more  headache-free days than pain days. She is currently taking fioricet twice daily for the past two-months which has improved her headaches, but feels that if she does not take fioricet, headaches return.   UPDATE 09/19/2014:  She increased topamax to 50mg  daily and was able to stop amitriptyline, but since stopping amitriptyline she reports her neck pain is worse.  She has tried flexeril in the past, but develop GI side effects.  She continues to take Fioricet about three times per month.  She had taken maxalt about twice per week.  Overall, she feels that her headaches are worse especially over the past month, because they are occuring daily.  She had one urgent care visit after developing a rebound headache to fioricet.  After Thanksgiving holiday, she had severe headache that kept her home all week.     Medications:  Current Outpatient Prescriptions on File Prior to Visit  Medication Sig Dispense Refill  . butalbital-acetaminophen-caffeine (FIORICET, ESGIC) 50-325-40 MG per tablet Take 1 tablet by mouth 2 (two) times daily as needed for headache or migraine. 30 tablet 0  . ketorolac (TORADOL) 10 MG tablet Take 10 mg by mouth every 6 (six) hours as needed.      . promethazine (PHENERGAN) 25 MG tablet Take 25 mg by mouth 3 (three) times daily as needed.      . rizatriptan (MAXALT) 5 MG tablet Take 5 mg by mouth as needed for migraine. May repeat in 2 hours if needed    . topiramate (TOPAMAX) 25 MG tablet Take one tablet daily. (Patient taking differently: 50 mg. Take one tablet daily.) 30 tablet 6  . zolpidem (AMBIEN) 10 MG  tablet TAKE ONE-HALF TO ONE TABLET BY MOUTH AT BEDTIME AS NEEDED AS NEEDED FOR SLEEP 30 tablet 3   No current facility-administered medications on file prior to visit.    Allergies:  Allergies  Allergen Reactions  . Codeine     REACTION: vomiting  . Codeine Phosphate     REACTION: vomiting  . Penicillins     REACTION: urticaria (hives)    Review of Systems:    CONSTITUTIONAL: No fevers, chills, night sweats, or weight loss.  EYES: No visual changes or eye pain ENT: No hearing changes.  No history of nose bleeds.   RESPIRATORY: No cough, wheezing and shortness of breath.   CARDIOVASCULAR: Negative for chest pain, and palpitations.   GI: Negative for abdominal discomfort, blood in stools or black stools.  No recent change in bowel habits.   GU:  No history of incontinence.   MUSCLOSKELETAL: No history of joint pain or swelling.  No myalgias.   SKIN: Negative for lesions, rash, and itching.   ENDOCRINE: Negative for cold or heat intolerance, polydipsia or goiter.   PSYCH:  No depression +anxiety symptoms.   NEURO: As Above.   Vital Signs:  BP 130/80 mmHg  Pulse 63  Ht 5\' 3"  (1.6 m)  Wt 125 lb 3 oz (56.785 kg)  BMI 22.18 kg/m2  SpO2 99%  Neurological Exam: MENTAL STATUS including orientation to time, place, person, recent and remote memory, attention span and concentration, language, and fund of knowledge is normal.  Speech is not dysarthric.  CRANIAL NERVES:  Pupils equal round and reactive to light.  Normal conjugate, extra-ocular eye movements in all directions of gaze. No ptosis.   Face is symmetric.   MOTOR:  Motor strength is 5/5 in all extremities.  Normal tone.  COORDINATION/GAIT:  Gait narrow based and stable.   Data: CT head 02/10/2014:  Negative   IMPRESSION: 1.  Chronic daily headaches  - pain improved with amitriptyline but untable to titrate beyond 20mg  due to cognitive side effects  - started TPM in May which has no notable benefit but denies side effects 2.  Cervicalgia  - worsening symptoms with neck PT  - did no tolerate flexeril due to GI side effects  - consider adding baclofen going forward 3.  Episodic migraine without aura 4.  Medication over use headaches  - Stressed the importance of limited PRN medications to twice per week   PLAN/RECOMMENDATIONS:  1.  Increase topamax to 25mg  in the morning and 50mg   at bedtime x 2 weeks, then increased to 50mg  BID 2. If no improvement with titration of topamax, consider nortriptyline or desipramine 3. OK to use maxalt for severe headaches, no more than twice weekly.  4.  Patient to call with update in 33-month 6. Return to clinic in 64-months  The duration of this appointment visit was 25 minutes of face-to-face time with the patient.  Greater than 50% of this time was spent in counseling, explanation of diagnosis, planning of further management, and coordination of care.   Thank you for allowing me to participate in patient's care.  If I can answer any additional questions, I would be pleased to do so.    Sincerely,    Shayde Gervacio K. Posey Pronto, DO   f

## 2014-09-19 NOTE — Patient Instructions (Signed)
1.   Increase topamax to 25mg  in the morning and 50mg  at bedtime x 2 weeks, then increased to 50mg  twice daily 2.   Consider nortriptyline going forward if headaches do not improve 3.   Call with an update in 12-month 4.   Return to clinic 59-months

## 2014-09-22 ENCOUNTER — Encounter: Payer: Self-pay | Admitting: *Deleted

## 2014-09-22 ENCOUNTER — Telehealth: Payer: Self-pay | Admitting: Neurology

## 2014-09-22 NOTE — Progress Notes (Unsigned)
No show letter sent for 09/19/2014

## 2014-09-22 NOTE — Telephone Encounter (Signed)
Pt no showed 09/19/14 appt w/ Dr. Posey Pronto.  [name]- - please send no show letter / Laurie [name] S/

## 2014-12-15 ENCOUNTER — Telehealth: Payer: Self-pay | Admitting: Neurology

## 2014-12-15 NOTE — Telephone Encounter (Signed)
Patient is having a lot of headaches.  She is taking nortriptylline but still having them.  She is also using toradol, maxalt and excedrine migraine.  Is there anything else we can try?  Please advise.  Thanks.

## 2014-12-15 NOTE — Telephone Encounter (Signed)
She is coming back in on March 15.  She is on 10 mg nortriptylline.

## 2014-12-15 NOTE — Telephone Encounter (Signed)
Pt left message on voice mail stating that she needed to talk to someone about headaches please call [phone]-

## 2014-12-15 NOTE — Telephone Encounter (Signed)
How much nortriptyline? We may need to adjust medications, please ask her to schedule f/u appointment so we can discuss options.  Santanna Olenik K. Posey Pronto, DO

## 2014-12-16 NOTE — Telephone Encounter (Signed)
Please tell her to increase nortriptyline to 20mg  qhs.  We can discuss further at her f/u visit.  Donika K. Posey Pronto, DO

## 2014-12-16 NOTE — Telephone Encounter (Signed)
Patient given instructions and agreed with plan.  

## 2014-12-29 ENCOUNTER — Other Ambulatory Visit: Payer: Self-pay | Admitting: Family Medicine

## 2014-12-29 NOTE — Telephone Encounter (Signed)
Ambien refill request.  Last seen 02/19/2014.  Last filled 09/09/2014.  Please advise.

## 2014-12-29 NOTE — Telephone Encounter (Signed)
Px written for call in   

## 2014-12-29 NOTE — Telephone Encounter (Signed)
Ambien called into [facility].

## 2014-12-30 ENCOUNTER — Ambulatory Visit: Payer: Self-pay | Admitting: Neurology

## 2015-01-13 ENCOUNTER — Ambulatory Visit (INDEPENDENT_AMBULATORY_CARE_PROVIDER_SITE_OTHER): Payer: Commercial Managed Care - PPO | Admitting: Neurology

## 2015-01-13 ENCOUNTER — Encounter: Payer: Self-pay | Admitting: Neurology

## 2015-01-13 VITALS — BP 110/78 | HR 96 | Ht 63.0 in | Wt 121.5 lb

## 2015-01-13 DIAGNOSIS — R51 Headache: Secondary | ICD-10-CM

## 2015-01-13 DIAGNOSIS — R519 Headache, unspecified: Secondary | ICD-10-CM

## 2015-01-13 DIAGNOSIS — G43009 Migraine without aura, not intractable, without status migrainosus: Secondary | ICD-10-CM | POA: Diagnosis not present

## 2015-01-13 MED ORDER — BACLOFEN 10 MG PO TABS: 10.0000 mg | ORAL_TABLET | Freq: Every evening | ORAL | Status: DC | PRN

## 2015-01-13 MED ORDER — BUTALBITAL-APAP-CAFFEINE 50-325-40 MG PO TABS: 1.0000 | ORAL_TABLET | Freq: Two times a day (BID) | ORAL | Status: DC | PRN

## 2015-01-13 NOTE — Progress Notes (Signed)
Follow-up Visit   Date: 01/13/2015    Laurie [name] MRN: [id number] DOB: [birth year]   Interim History: Laurie [name] is a 49 y.o. right-handed Caucasian female with migraines and insomnia returning to the clinic for follow-up of headaches.  The patient was accompanied to the clinic by self.  History of present illness: Headaches started in her early 54s after blunt head injury from weight-boarding. There was no loss of consciousness. She had MRI of the neck which showed mild disc degeneration. Since then, headaches periodically worsened so received steroid injections to her neck which helped. She also tried hormone replacement that helped her pain. She has tried Lyrica in the past but it was stopped due to cognitive side effects. Imitrex did not work. Usually, she would get a headache once every three months, lasting 1-2 hours if she was able to take toradol and maxalt. Pain is located over the left temporal region, described as throbbing, and incapacitating. She endorses significant photosensitivity, nausea, vomiting, and phonophobia. She usually has associated neck tightness. Rest improves headaches and is worsened by stress and exertion. She tried neck physical therapy which exacerbated symptoms. No numbness/tingling, weakness, or vision changes.   Since early 2015, her headaches have become more frequent and more intense. She reports starting her new job in January and although she really enjoys it, it is definitely a new stress. Headaches are now occuring almost daily, with only 1-2 headache-free days per week since January 2015. Pain is ranked as 8-9/10 when severe, and 5-6/10 daily. She saw her Marshall Cork, NP in April at which time CT head was normal and she was given toradol and depomedrol injection which did not help, so saw Dr. Glori Bickers in early May who started her on elavil. She feels that the headaches have improved since taking Elavil, because she has more  headache-free days than pain days. She is currently taking fioricet twice daily for the past two-months which has improved her headaches, but feels that if she does not take fioricet, headaches return.   UPDATE 09/19/2014:  She increased topamax to 50mg  daily and was able to stop amitriptyline, but since stopping amitriptyline she reports her neck pain is worse.  She has tried flexeril in the past, but develop GI side effects.  She continues to take Fioricet about three times per month.  She had taken maxalt about twice per week.  Overall, she feels that her headaches are worse especially over the past month, because they are occuring daily.  She had one urgent care visit after developing a rebound headache to fioricet.  After Thanksgiving holiday, she had severe headache that kept her home all week.    UPDATE 01/13/2015:  She stopped topamax because of GI upset and started nortriptyline which seems to reduce the intensity of the headaches. She is now getting severe headaches every 2 weeks, which is a huge improvement.  She seems to tolerate the medication well and reports only fatigue.  She is using much less of the fioricet and maxalt, but at times the fioricet is the only medication that helps.  Overall, her use of rescue medications has improved.     Medications:  Current Outpatient Prescriptions on File Prior to Visit  Medication Sig Dispense Refill  . butalbital-acetaminophen-caffeine (FIORICET, ESGIC) 50-325-40 MG per tablet Take 1 tablet by mouth 2 (two) times daily as needed for headache or migraine. 30 tablet 0  . ketorolac (TORADOL) 10 MG tablet Take 10 mg by mouth every 6 (six) hours  as needed.      . rizatriptan (MAXALT) 5 MG tablet Take 5 mg by mouth as needed for migraine. May repeat in 2 hours if needed    . zolpidem (AMBIEN) 10 MG tablet TAKE ONE-HALF TO ONE TABLET BY MOUTH AT BEDTIME AS NEEDED FOR SLEEP 30 tablet 3   No current facility-administered medications on file prior to  visit.    Allergies:  Allergies  Allergen Reactions  . Codeine     REACTION: vomiting  . Codeine Phosphate     REACTION: vomiting  . Penicillins     REACTION: urticaria (hives)    Review of Systems:  CONSTITUTIONAL: No fevers, chills, night sweats, or weight loss.  EYES: No visual changes or eye pain ENT: No hearing changes.  No history of nose bleeds.   RESPIRATORY: No cough, wheezing and shortness of breath.   CARDIOVASCULAR: Negative for chest pain, and palpitations.   GI: Negative for abdominal discomfort, blood in stools or black stools.  No recent change in bowel habits.   GU:  No history of incontinence.   MUSCLOSKELETAL: No history of joint pain or swelling.  No myalgias.   SKIN: Negative for lesions, rash, and itching.   ENDOCRINE: Negative for cold or heat intolerance, polydipsia or goiter.   PSYCH:  No depression +anxiety symptoms.   NEURO: As Above.   Vital Signs:  BP 110/78 mmHg  Pulse 96  Ht 5\' 3"  (1.6 m)  Wt 121 lb 8 oz (55.112 kg)  BMI 21.53 kg/m2  SpO2 99%  Neurological Exam: MENTAL STATUS including orientation to time, place, person, recent and remote memory, attention span and concentration, language, and fund of knowledge is normal.  Speech is not dysarthric.  CRANIAL NERVES:  Pupils equal round and reactive to light.  Normal conjugate, extra-ocular eye movements in all directions of gaze. No ptosis.   Face is symmetric.   MOTOR:  Motor strength is 5/5 in all extremities.  Normal tone.  COORDINATION/GAIT:  Gait narrow based and stable.   Data: CT head 02/10/2014:  Negative   IMPRESSION: 1.  Chronic daily headaches  - Previously tried:  Amitriptyline 20mg  (improved headaches, but stopped due to cognitive side effects), TPM (GI upset)  - Doing well on nortriptyline 20mg  qhs  2.  Cervicalgia  - worsening symptoms with neck PT  - did no tolerate flexeril due to GI side effects  - Start baclofen 10mg  as needed for neck pain  3.  Episodic  migraine without aura  - Use baclofen, zofran, and benadryl prn  - Rx given for fioricet but stressed limiting the use of this  4.  Medication over use headaches, improving   PLAN/RECOMMENDATIONS:  1.  Continue nortriptyline 20mg  at bedtime 2.  For severe headaches, ok to use baclofen 5-10mg , zofran, and benadryl 25mg  3.  For neck spasm, start baclofen 5-10mg  as needed.  4.  Limit all rescue medications to twice per week 5.  Return to clinic as clinic    The duration of this appointment visit was 25 minutes of face-to-face time with the patient.  Greater than 50% of this time was spent in counseling, explanation of diagnosis, planning of further management, and coordination of care.   Thank you for allowing me to participate in patient's care.  If I can answer any additional questions, I would be pleased to do so.    Sincerely,    Ozan Maclay K. Posey Pronto, DO   Cc: Claiborne Billings, NP

## 2015-01-13 NOTE — Patient Instructions (Signed)
Continue nortriptyline 20mg  at bedtime For severe headaches, ok to use baclofen 5-10mg , zofran, and benadryl 25mg  (if you are able to lay down to rest) For neck spasm, take baclofen 5-10mg  as needed.  It may make you sleepy, so be cautious when driving. Limit all rescue medications to twice per week Return to clinic as clinic

## 2015-03-03 ENCOUNTER — Telehealth: Payer: Self-pay | Admitting: Family Medicine

## 2015-03-03 NOTE — Telephone Encounter (Signed)
Please follow up for a visit with me to examine you and then determine what to order from there

## 2015-03-03 NOTE — Telephone Encounter (Signed)
appt scheduled

## 2015-03-03 NOTE — Telephone Encounter (Signed)
Pt would you like call back.   She does not have an ob/gyn and has back pain, and has 2 lumps on her breast.   She wants to know what to do next.   She has already had a mammogram in 2015, and had 1 lump then, nothing showed up.  It is still there, now she has another one and it is large.   Pt requests call back.  thanks

## 2015-03-04 ENCOUNTER — Ambulatory Visit (INDEPENDENT_AMBULATORY_CARE_PROVIDER_SITE_OTHER): Payer: Commercial Managed Care - PPO | Admitting: Family Medicine

## 2015-03-04 ENCOUNTER — Ambulatory Visit (INDEPENDENT_AMBULATORY_CARE_PROVIDER_SITE_OTHER)
Admission: RE | Admit: 2015-03-04 | Discharge: 2015-03-04 | Disposition: A | Discharge status: Home / Self Care | Payer: Commercial Managed Care - PPO | Source: Ambulatory Visit | Attending: Family Medicine | Admitting: Family Medicine

## 2015-03-04 ENCOUNTER — Telehealth: Payer: Self-pay | Admitting: Family Medicine

## 2015-03-04 ENCOUNTER — Telehealth: Payer: Self-pay | Admitting: *Deleted

## 2015-03-04 ENCOUNTER — Encounter: Payer: Self-pay | Admitting: Family Medicine

## 2015-03-04 VITALS — BP 118/68 | HR 89 | Temp 98.1°F | Ht 63.0 in | Wt 122.5 lb

## 2015-03-04 DIAGNOSIS — N63 Unspecified lump in unspecified breast: Secondary | ICD-10-CM | POA: Insufficient documentation

## 2015-03-04 DIAGNOSIS — R350 Frequency of micturition: Secondary | ICD-10-CM

## 2015-03-04 DIAGNOSIS — R8290 Unspecified abnormal findings in urine: Secondary | ICD-10-CM | POA: Diagnosis not present

## 2015-03-04 DIAGNOSIS — M545 Low back pain, unspecified: Secondary | ICD-10-CM

## 2015-03-04 DIAGNOSIS — E559 Vitamin D deficiency, unspecified: Secondary | ICD-10-CM

## 2015-03-04 DIAGNOSIS — R102 Pelvic and perineal pain: Secondary | ICD-10-CM

## 2015-03-04 HISTORY — DX: Low back pain, unspecified: M54.50

## 2015-03-04 HISTORY — DX: Vitamin D deficiency, unspecified: E55.9

## 2015-03-04 LAB — POCT URINALYSIS DIPSTICK
Glucose, UA: NEGATIVE
Nitrite, UA: NEGATIVE
PH UA: 6
Spec Grav, UA: 1.02
Urobilinogen, UA: 0.2

## 2015-03-04 LAB — POCT UA - MICROSCOPIC ONLY
Casts, Ur, LPF, POC: 0
Yeast, UA: 0

## 2015-03-04 MED ORDER — CIPROFLOXACIN HCL 250 MG PO TABS: 250.0000 mg | ORAL_TABLET | Freq: Two times a day (BID) | ORAL | Status: DC

## 2015-03-04 NOTE — Telephone Encounter (Signed)
Xray results given to pt

## 2015-03-04 NOTE — Patient Instructions (Addendum)
Urinalysis today on the way out  Xray of back today here -will call with result  Stop at check out for referral to [facility] for mammogram and ultrasound  Add another 2000 iu vit D daily over the counter   We will make a plan for the pelvic problem after all this comes back

## 2015-03-04 NOTE — Telephone Encounter (Signed)
Rx sent per Dr. Marliss Coots message

## 2015-03-04 NOTE — Telephone Encounter (Signed)
Pt wanted to speak with you, can you please return her call at home number? Thanks

## 2015-03-04 NOTE — Telephone Encounter (Signed)
-----   Message from Abner Greenspan, MD sent at 03/04/2015 11:17 AM EDT ----- Please let pt know it looks like she has a uti-pending culture  Please call in cipro 250 mg 1 po bid for 5d #10 no ref  Drink lots of water

## 2015-03-04 NOTE — Progress Notes (Signed)
Pre visit review using our clinic review tool, if applicable. No additional management support is needed unless otherwise documented below in the visit note. 

## 2015-03-04 NOTE — Progress Notes (Signed)
Subjective:    Patient ID: Laurie [name], female    DOB: October 18, 1965, 49 y.o.   MRN: [id number]  HPI Here for breast lump and back pain   Had a knot on L breast 2 years ago- checked by gyn in [place]  Was told it was ok  Had a mammogram and a thermogram - did not show anything but scattered microcalcifications and dense breast tissue  Never goes away and never changes   Now new knot in R breast- it is a bigger lump (walnut size) 2 months  No soreness No skin change Does get itching under R arm - that may or may not be related - that comes and goes  Last mammogram was 2 y ago     Low back pain - that started about 6 months ago  On R side and is achey in nature  No injury /no over exertion  Is intermittent  Some nights it is hard to sleep due to pain  When it is there it is constant - and no  Position or activity makes it better or worse  Has not tried medicine or ice or heat  Does not radiate anywhere   Has some swelling/bloating feeling in R lower abdomen  About 6 months  Weight is not fluctuating  Can see it  Uncomfortable but not painful  No hx of hernia  It comes and goes - sometimes same timing as her back pain  Hx of endometriosis and cysts -2 years ago had [place] and had cysts in that area  Had a hysterectomy 10 years ago - for endometiosis but still has ovaries   No burning - but some frequency of urination No blood in urine   Patient Active Problem List   Diagnosis Date Noted  . Chronic daily headache 02/19/2014  . DISORDERS, ORGANIC SLEEP NEC 06/06/2007  . [place] DISEASE 06/05/2007  . ANXIETY STATE NOS 04/04/2007  . MIGRAINE NOS W/O INTRACTABLE MIGRAINE 04/04/2007  . NECK PAIN, CHRONIC 04/04/2007   Past Medical History  Diagnosis Date  . Migraine   . Chronic neck pain     from disc dz  . Anxiety     situational  . Insomnia     organic sleep disorder, failed multiple meds  . IBS (irritable bowel syndrome)     diarrhea predominant    Past Surgical History  Procedure Laterality Date  . Knee surgery    . Epidural injections    . Deg c-s disk/spinal stenosis  06/2007  . Abdominal hysterectomy     History  Substance Use Topics  . Smoking status: Never Smoker   . Smokeless tobacco: Never Used  . Alcohol Use: No   Family History  Problem Relation Age of Onset  . COPD Father     Deceased, 78  . Heart disease Father     CAD  . Heart disease Other     CAD  . Heart disease Other     massive MI  . Migraines Father   . Migraines Brother   . Thyroid disease Mother     Living, 93  . Migraines Daughter    Allergies  Allergen Reactions  . Codeine     REACTION: vomiting  . Codeine Phosphate     REACTION: vomiting  . Penicillins     REACTION: urticaria (hives)   Current Outpatient Prescriptions on File Prior to Visit  Medication Sig Dispense Refill  . baclofen (LIORESAL) 10 MG tablet Take 1 tablet (  10 mg total) by mouth at bedtime as needed for muscle spasms. 20 each 3  . ketorolac (TORADOL) 10 MG tablet Take 10 mg by mouth every 6 (six) hours as needed.      . nortriptyline (PAMELOR) 10 MG capsule Take 20 mg by mouth at bedtime.    . ondansetron (ZOFRAN) 4 MG tablet Take 4 mg by mouth every 8 (eight) hours as needed for nausea or vomiting.    . rizatriptan (MAXALT) 5 MG tablet Take 5 mg by mouth as needed for migraine. May repeat in 2 hours if needed    . zolpidem (AMBIEN) 10 MG tablet TAKE ONE-HALF TO ONE TABLET BY MOUTH AT BEDTIME AS NEEDED FOR SLEEP 30 tablet 3  . butalbital-acetaminophen-caffeine (FIORICET, ESGIC) 50-325-40 MG per tablet Take 1 tablet by mouth 2 (two) times daily as needed for headache or migraine. (Patient not taking: Reported on 03/04/2015) 30 tablet 0   No current facility-administered medications on file prior to visit.      Review of Systems Review of Systems  Constitutional: Negative for fever, appetite change, and unexpected weight change.  Eyes: Negative for pain and visual  disturbance.  Respiratory: Negative for cough and shortness of breath.   Cardiovascular: Negative for cp or palpitations    Gastrointestinal: Negative for nausea, diarrhea and constipation.  Genitourinary: posfor urgency and frequency. neg for hematuria, pos for R pelvic discomfort and bloating , pos for breast lumps  Skin: Negative for pallor or rash   Neurological: Negative for weakness, light-headedness, numbness and headaches.  Hematological: Negative for adenopathy. Does not bruise/bleed easily.  Psychiatric/Behavioral: Negative for dysphoric mood. The patient is not nervous/anxious.         Objective:   Physical Exam  Constitutional: She appears well-developed and well-nourished. No distress.  Well appearing   HENT:  Head: Normocephalic and atraumatic.  Mouth/Throat: Oropharynx is clear and moist.  Eyes: Conjunctivae and EOM are normal. Pupils are equal, round, and reactive to light. Right eye exhibits no discharge. Left eye exhibits no discharge. No scleral icterus.  Neck: Normal range of motion. Neck supple. No JVD present. No thyromegaly present.  Cardiovascular: Normal rate, regular rhythm and normal heart sounds.   Pulmonary/Chest: Effort normal and breath sounds normal.  Abdominal: Soft. Bowel sounds are normal. She exhibits no distension and no mass. There is no hepatosplenomegaly. There is tenderness in the right lower quadrant. There is no rebound, no guarding, no CVA tenderness, no tenderness at McBurney's point and negative Murphy's sign.  No cva tenderness  Mild suprapubic tenderness  Mild prominence in RLQ/ R pelvic area without notable mass and unchanged with valsalva   Genitourinary:  R breast - small 1 cm mobile firm mass at 2:00 that is nt L breast large mass at 9:00 that feels lobulated- nt and mobile  No skin change or nipple d/c   Musculoskeletal: She exhibits tenderness. She exhibits no edema.  Mildly tender lower LS and R lumbar musculature  Neg slr Nl  rom  Nl gait   Lymphadenopathy:    She has no cervical adenopathy.  Neurological: She is alert. No cranial nerve deficit. She exhibits normal muscle tone. Coordination normal.  Skin: No rash noted.  Psychiatric: She has a normal mood and affect.          Assessment & Plan:   Problem List Items Addressed This Visit    Breast lump in female - Primary    Small lump on r and large on L  Suspect cysts/ (also has fibrocystic breasts)  Sent for dx mammogram and [place] bilat -pend results       Relevant Orders   US BREAST LTD UNI LEFT INC AXILLA   US BREAST LTD UNI RIGHT INC AXILLA   MM DIAG BREAST TOMO BILATERAL   Frequent urination    ua today Results for orders placed or performed in visit on 03/04/15  POCT urinalysis dipstick  Result Value Ref Range   Color, UA Yellow    Clarity, UA Cloudy    Glucose, UA Neg.    Bilirubin, UA Trace    Ketones, UA Small    Spec Grav, UA 1.020    Blood, UA Moderate    pH, UA 6.0    Protein, UA Trace    Urobilinogen, UA 0.2    Nitrite, UA Neg.    Leukocytes, UA large (3+)   POCT UA - Microscopic Only  Result Value Ref Range   WBC, Ur, HPF, POC many    RBC, urine, microscopic many    Bacteria, U Microscopic many    Mucus, UA few    Epithelial cells, urine per micros 1-2    Crystals, Ur, HPF, POC few    Casts, Ur, LPF, POC 0    Yeast, UA 0    sent for culture  Cover with cipro for uti       Relevant Orders   POCT urinalysis dipstick (Completed)   POCT UA - Microscopic Only (Completed)   Low back pain    Xray today  6 mo/ intermittent w/o neuro symptoms  Unremarkable exam       Relevant Orders   DG Lumbar Spine Complete (Completed)   Pelvic pain in female    Ongoing -mostly R sided with c/o of some bloating and no GI symptoms  ? If poss ov cyst or hernia  Will work this up once breast studies are done       Relevant Orders   POCT urinalysis dipstick (Completed)   Vitamin D deficiency    Level in the teens  Has started  2000 iu daily  No symptoms  Disc imp for bone and overall health         Other Visit Diagnoses    Abnormal urinalysis        Relevant Orders    Urine culture    POCT UA - Microscopic Only (Completed)

## 2015-03-05 NOTE — Assessment & Plan Note (Signed)
Xray today  6 mo/ intermittent w/o neuro symptoms  Unremarkable exam

## 2015-03-05 NOTE — Assessment & Plan Note (Signed)
Small lump on r and large on L  Suspect cysts/ (also has fibrocystic breasts)  Sent for dx mammogram and [place] bilat -pend results

## 2015-03-05 NOTE — Assessment & Plan Note (Signed)
Ongoing -mostly R sided with c/o of some bloating and no GI symptoms  ? If poss ov cyst or hernia  Will work this up once breast studies are done

## 2015-03-05 NOTE — Assessment & Plan Note (Signed)
ua today Results for orders placed or performed in visit on 03/04/15  POCT urinalysis dipstick  Result Value Ref Range   Color, UA Yellow    Clarity, UA Cloudy    Glucose, UA Neg.    Bilirubin, UA Trace    Ketones, UA Small    Spec Grav, UA 1.020    Blood, UA Moderate    pH, UA 6.0    Protein, UA Trace    Urobilinogen, UA 0.2    Nitrite, UA Neg.    Leukocytes, UA large (3+)   POCT UA - Microscopic Only  Result Value Ref Range   WBC, Ur, HPF, POC many    RBC, urine, microscopic many    Bacteria, U Microscopic many    Mucus, UA few    Epithelial cells, urine per micros 1-2    Crystals, Ur, HPF, POC few    Casts, Ur, LPF, POC 0    Yeast, UA 0    sent for culture  Cover with cipro for uti

## 2015-03-05 NOTE — Assessment & Plan Note (Addendum)
Level in the teens  Has started 2000 iu daily  No symptoms  Disc imp for bone and overall health

## 2015-03-06 ENCOUNTER — Ambulatory Visit
Admission: RE | Admit: 2015-03-06 | Discharge: 2015-03-06 | Disposition: A | Discharge status: Home / Self Care | Payer: Commercial Managed Care - PPO | Source: Ambulatory Visit | Attending: Family Medicine | Admitting: Family Medicine

## 2015-03-06 ENCOUNTER — Encounter (INDEPENDENT_AMBULATORY_CARE_PROVIDER_SITE_OTHER): Payer: Self-pay

## 2015-03-06 DIAGNOSIS — N63 Unspecified lump in unspecified breast: Secondary | ICD-10-CM

## 2015-03-07 LAB — URINE CULTURE: Colony Count: >=100000

## 2015-03-08 ENCOUNTER — Telehealth: Payer: Self-pay | Admitting: Family Medicine

## 2015-03-08 DIAGNOSIS — N63 Unspecified lump in unspecified breast: Secondary | ICD-10-CM

## 2015-03-08 NOTE — Telephone Encounter (Signed)
-----   Message from [place], [place] sent at 03/06/2015  4:28 PM EDT ----- Pt advised of results and agrees with referral to surgeon to eval both the cyst and the fibroglandular tissue, please put referral in, I advise pt our [facility] will call to schedule apppt

## 2015-03-08 NOTE — Telephone Encounter (Signed)
Ref for breast lump

## 2015-03-09 NOTE — Telephone Encounter (Signed)
Appt made with Dr Donne Hazel at [facility] on 03/27/15 and patient is aware.

## 2015-03-10 ENCOUNTER — Telehealth: Payer: Self-pay | Admitting: Family Medicine

## 2015-03-10 DIAGNOSIS — R102 Pelvic and perineal pain: Secondary | ICD-10-CM

## 2015-03-10 NOTE — Telephone Encounter (Signed)
-----   Message from Tammi Sou, [place] sent at 03/10/2015  2:08 PM EDT ----- Pt agrees with referral to GYN, she doesn't have a doctor so whoever you refer her to is fine but she would like to see someone in [place]. If possible please put referral in as urgent because pt is in a lot of pain

## 2015-03-10 NOTE — Telephone Encounter (Signed)
Urgent ref done  Will route to [facility]

## 2015-03-10 NOTE — Telephone Encounter (Signed)
Pt is returning your call, she thinks it may have to do with a uti.  Please call back at mobile number. Thanks

## 2015-03-10 NOTE — Telephone Encounter (Signed)
Addressed through results notes  

## 2015-03-11 ENCOUNTER — Telehealth: Payer: Self-pay | Admitting: Neurology

## 2015-03-11 NOTE — Telephone Encounter (Signed)
Melinda with [facility] called in regards to Calton Golds and a prescription prescribed for her (Nortriptyline) would you please call her back at [phone]

## 2015-03-11 NOTE — Telephone Encounter (Signed)
Called back and left message for Rip Harbour to call me back.

## 2015-03-11 NOTE — Telephone Encounter (Signed)
Appt made with [facility] and patient is aware.

## 2015-03-12 ENCOUNTER — Other Ambulatory Visit: Payer: Self-pay | Admitting: *Deleted

## 2015-03-12 MED ORDER — NORTRIPTYLINE HCL 10 MG PO CAPS: 20.0000 mg | ORAL_CAPSULE | Freq: Every day | ORAL | Status: DC

## 2015-03-12 NOTE — Telephone Encounter (Signed)
Spoke with Rip Harbour and she said that patient keeps coming to her at [facility] (company clinic) to get her refills.  I informed her that we will fill all meds given to her by Dr. Posey Pronto.  She will request that patient call the pharmacy when she needs refills.  Nortriptyline refill sent.

## 2015-03-12 NOTE — Telephone Encounter (Signed)
error 

## 2015-03-27 ENCOUNTER — Other Ambulatory Visit: Payer: Self-pay | Admitting: General Surgery

## 2015-03-27 DIAGNOSIS — N6001 Solitary cyst of right breast: Secondary | ICD-10-CM

## 2015-03-31 ENCOUNTER — Ambulatory Visit
Admission: RE | Admit: 2015-03-31 | Discharge: 2015-03-31 | Disposition: A | Discharge status: Home / Self Care | Payer: Commercial Managed Care - PPO | Source: Ambulatory Visit | Attending: General Surgery | Admitting: General Surgery

## 2015-03-31 ENCOUNTER — Other Ambulatory Visit (HOSPITAL_COMMUNITY)
Admission: RE | Admit: 2015-03-31 | Discharge: 2015-03-31 | Disposition: A | Discharge status: Home / Self Care | Payer: Commercial Managed Care - PPO | Source: Ambulatory Visit | Attending: Diagnostic Radiology | Admitting: Diagnostic Radiology

## 2015-03-31 DIAGNOSIS — N6001 Solitary cyst of right breast: Secondary | ICD-10-CM

## 2015-03-31 HISTORY — PX: BREAST BIOPSY: SHX20

## 2015-03-31 HISTORY — PX: BREAST CYST ASPIRATION: SHX578

## 2015-04-21 ENCOUNTER — Other Ambulatory Visit: Payer: Self-pay

## 2015-04-21 MED ORDER — ZOLPIDEM TARTRATE 10 MG PO TABS: ORAL_TABLET | ORAL | Status: DC

## 2015-04-21 NOTE — Telephone Encounter (Signed)
Pt left v/m requesting refill ambien to [facility]; pt is out of med. Last seen sick visit 03/04/15 and rx last refill # 30  X 3 on 12/29/14.Please advise. Pt request to be refilled today.

## 2015-04-21 NOTE — Telephone Encounter (Signed)
Px written for call in   

## 2015-04-21 NOTE — Telephone Encounter (Signed)
Rx called in as prescribed 

## 2015-04-28 ENCOUNTER — Other Ambulatory Visit: Payer: Self-pay | Admitting: Neurology

## 2015-04-28 NOTE — Telephone Encounter (Signed)
Rx sent 

## 2015-04-28 NOTE — Telephone Encounter (Signed)
Rx faxed

## 2015-06-01 ENCOUNTER — Other Ambulatory Visit: Payer: Self-pay | Admitting: Neurology

## 2015-06-01 NOTE — Telephone Encounter (Signed)
Baclofen refill requested. Per last office note- patient to remain on medication. Refill approved and sent to patient's pharmacy.

## 2015-07-06 ENCOUNTER — Other Ambulatory Visit: Payer: Self-pay | Admitting: Neurology

## 2015-07-06 NOTE — Telephone Encounter (Signed)
Looks like patient is following up PRN. Okay to fill RX?

## 2015-07-06 NOTE — Telephone Encounter (Signed)
RX sent to pharmacy with note that patient needs follow up appt for further refills.

## 2015-07-06 NOTE — Telephone Encounter (Signed)
OK to refill for 30-days with 1 refill for nortriptyline, but she needs to set up follow-up visit prior to next Rx.  Only give fioricet #20, no refills.

## 2015-07-16 ENCOUNTER — Other Ambulatory Visit: Payer: Self-pay | Admitting: Neurology

## 2015-07-16 NOTE — Telephone Encounter (Signed)
Rx sent 

## 2015-07-23 ENCOUNTER — Telehealth: Payer: Self-pay | Admitting: Neurology

## 2015-07-23 NOTE — Telephone Encounter (Signed)
Pt called in regards to her prescription Nortyptoline/Dawn CB# [phone]

## 2015-07-23 NOTE — Telephone Encounter (Signed)
Called patient back and left message for her to call me back.  

## 2015-07-24 ENCOUNTER — Other Ambulatory Visit: Payer: Self-pay | Admitting: *Deleted

## 2015-07-24 MED ORDER — NORTRIPTYLINE HCL 10 MG PO CAPS: 20.0000 mg | ORAL_CAPSULE | Freq: Every day | ORAL | Status: DC

## 2015-07-24 NOTE — Telephone Encounter (Signed)
Patient called back and said that she can't find her nortriptyline.  [place].  Ok to refill?

## 2015-07-24 NOTE — Telephone Encounter (Signed)
OK to refill for 3 months, but she needs a f/u appointment prior to next refill.  Tandi Hanko K. Posey Pronto, DO

## 2015-07-24 NOTE — Telephone Encounter (Signed)
Rx sent with note requesting for patient to call for an appointment before any more refills.

## 2015-07-27 ENCOUNTER — Telehealth: Payer: Self-pay | Admitting: Neurology

## 2015-07-27 ENCOUNTER — Other Ambulatory Visit: Payer: Self-pay | Admitting: *Deleted

## 2015-07-27 MED ORDER — NORTRIPTYLINE HCL 10 MG PO CAPS: 20.0000 mg | ORAL_CAPSULE | Freq: Every day | ORAL | Status: DC

## 2015-07-27 NOTE — Telephone Encounter (Signed)
Rx sent again

## 2015-07-27 NOTE — Telephone Encounter (Signed)
Pt called and states that the drug store did not get the rx we called into them on 07-24-15 please call it in again pt phone number is [phone]  Pt needs the nortriptyline

## 2015-08-09 ENCOUNTER — Other Ambulatory Visit: Payer: Self-pay | Admitting: Family Medicine

## 2015-08-11 ENCOUNTER — Other Ambulatory Visit: Payer: Self-pay | Admitting: *Deleted

## 2015-08-11 MED ORDER — ZOLPIDEM TARTRATE 10 MG PO TABS: ORAL_TABLET | ORAL | Status: DC

## 2015-08-11 NOTE — Telephone Encounter (Signed)
See prev note, last refilled on 04/21/15 #30 with 3 additional refills, not due until 08/22/15  Spoke with pharmacy and pt pick up Rx on 04/21/15, 05/20/15, 06/16/15, and 07/14/15, so no refills remaining and not really due until 08/22/15, please advise

## 2015-08-11 NOTE — Telephone Encounter (Signed)
Left voicemail requesting pt to call office back 

## 2015-08-11 NOTE — Telephone Encounter (Signed)
Spoke with pt and she said she is taking Rx as prescribed and hasn't taken more then she should and she isn't sure why Rx didn't last until 08/22/15 but she is okay with [place] only prescribed it one month at a time  Rx called in as prescribed

## 2015-08-11 NOTE — Telephone Encounter (Signed)
Spoke to pt who states that she was advised by pharmacy that there are no additional refills remaining on medication. She states she is needing new Rx, and request it be sent for approval as she "only has a few days left."

## 2015-08-11 NOTE — Telephone Encounter (Signed)
The pick up on 8/30 is off a bit (? If she picked up early due to travel or other reason)  Refill 1 month  Ask how many pills in bottle -(will be due for refill 30 days after taking first pill of this bottle)  I will not put refills on this one so we can track a little closer   Px written for call in

## 2015-08-21 ENCOUNTER — Other Ambulatory Visit: Payer: Self-pay | Admitting: Neurology

## 2015-08-21 NOTE — Telephone Encounter (Signed)
Rx sent 

## 2015-08-31 ENCOUNTER — Telehealth: Payer: Self-pay | Admitting: Neurology

## 2015-08-31 ENCOUNTER — Other Ambulatory Visit: Payer: Self-pay | Admitting: *Deleted

## 2015-08-31 MED ORDER — RIZATRIPTAN BENZOATE 5 MG PO TABS: 5.0000 mg | ORAL_TABLET | ORAL | Status: DC | PRN

## 2015-08-31 NOTE — Telephone Encounter (Signed)
[facility] states that [facility] does not have the [facility] please resend it

## 2015-08-31 NOTE — Telephone Encounter (Signed)
Rx sent 

## 2015-08-31 NOTE — Telephone Encounter (Signed)
Rx sent.  No refills until next appointment.

## 2015-08-31 NOTE — Telephone Encounter (Signed)
Pt called to request a refill of/ Maxalt?//[facility] [place]// call back @ [phone]

## 2015-09-09 ENCOUNTER — Other Ambulatory Visit: Payer: Self-pay

## 2015-09-09 MED ORDER — ZOLPIDEM TARTRATE 10 MG PO TABS: ORAL_TABLET | ORAL | Status: DC

## 2015-09-09 NOTE — Telephone Encounter (Signed)
Pt left v/m requesting refill zolpidem to [facility] on [address]. Last refilled # 30 on 08/11/15. Last seen 03/04/15. Pt will be out of med on 09/11/15 but will not have med for weekend. Pt request cb.

## 2015-09-09 NOTE — Telephone Encounter (Signed)
Rx called in as prescribed and pt notified  

## 2015-09-09 NOTE — Telephone Encounter (Signed)
Px written for call in   

## 2015-09-24 ENCOUNTER — Other Ambulatory Visit: Payer: Self-pay | Admitting: *Deleted

## 2015-09-24 ENCOUNTER — Telehealth: Payer: Self-pay | Admitting: Neurology

## 2015-09-24 MED ORDER — BUTALBITAL-APAP-CAFFEINE 50-325-40 MG PO TABS: ORAL_TABLET | ORAL | Status: DC

## 2015-09-24 NOTE — Telephone Encounter (Signed)
Pt called to request a refill of/ Fioricet/ call back @ [phone]

## 2015-09-28 ENCOUNTER — Other Ambulatory Visit: Payer: Self-pay | Admitting: Neurology

## 2015-09-28 NOTE — Telephone Encounter (Signed)
Called patient back and let her know that Dr. Posey Pronto is out of the office until Dec. 23 and none of the other doctors will sign the Rx.  Instructed her to see her PCP if headaches get worse.

## 2015-09-29 ENCOUNTER — Other Ambulatory Visit: Payer: Self-pay | Admitting: *Deleted

## 2015-09-29 ENCOUNTER — Other Ambulatory Visit: Payer: Self-pay | Admitting: Neurology

## 2015-09-29 NOTE — Telephone Encounter (Signed)
Refusal sent 

## 2015-09-29 NOTE — Telephone Encounter (Signed)
Refusal sent to pharmacy.

## 2015-10-22 ENCOUNTER — Telehealth: Payer: Self-pay | Admitting: Family Medicine

## 2015-10-22 NOTE — Telephone Encounter (Signed)
[name] DOB: [birth year] Initial Comment caller states she has abd pain and distension Nurse Assessment Nurse: Martyn Ehrich, RN, Felicia Date/Time (Eastern Time): 10/22/2015 3:02:19 PM Confirm and document reason for call. If symptomatic, describe symptoms. ---PT has had abdominal pain below naval for a few months. Now it is worse. It is continuous. She had US of the abdomen thru the OB MD and it was negative for cyst. Now her legs hurt. Pneumonia 2 weeks ago - she is on 2nd course of antx for that. No pain on urination. Bad hemorroids this week. Pain when she has a BM. Had a good BM yesterday. Abdominal pain is level 5. No fever. Has the patient traveled out of the country within the last 30 days? ---No Does the patient have any new or worsening symptoms? ---Yes Will a triage be completed? ---YesRelated visit to physician within the last 2 weeks? ---Yes Does the PT have any chronic conditions? (i.e. diabetes, asthma, etc.) ---No Did the patient indicate they were pregnant? ---No Is this a behavioral health or substance abuse call? ---No Guidelines Guideline Title Affirmed Question Affirmed Notes Pneumonia on Antibiotic Post- Hospitalization Follow-up Call [1] Taking antibiotics < 5 days after hospital discharge for pneumonia AND [2] symptoms improved (feels better) (all other triage questions negative) Abdominal Pain - Female [1] MILD-MODERATE pain AND [2] constant AND [3] present > 2 hours Final Disposition User See Physician within 4 Hours (or PCP triage) Martyn Ehrich, RN, Felicia Comments no fever. no pain in legs today Caller refusing to go to UC and she would like an appointment for tomorrow. Please call Pt back. NO blood on or in stool Referrals GOTO FACILITY REFUSED  Call Id: [id number]

## 2015-10-22 NOTE — Telephone Encounter (Signed)
Spoke with Laurie [name] and she scheduled 30 min appt with Allie Bossier NP on 10/23/15 at 10:15 AM; Laurie [name] wants referral to GI but explained does need to be seen and possible testing prior to any referrals. Laurie [name] voiced understanding. Laurie [name] also agreed if condition changes or worsens prior to appt Laurie [name] will go to UC or ED for eval.

## 2015-10-23 ENCOUNTER — Encounter: Payer: Commercial Managed Care - PPO | Admitting: Primary Care

## 2015-10-26 ENCOUNTER — Ambulatory Visit (INDEPENDENT_AMBULATORY_CARE_PROVIDER_SITE_OTHER): Payer: Commercial Managed Care - PPO | Admitting: Primary Care

## 2015-10-26 ENCOUNTER — Encounter: Payer: Self-pay | Admitting: Primary Care

## 2015-10-26 VITALS — BP 146/88 | HR 78 | Temp 97.9°F | Wt 135.4 lb

## 2015-10-26 DIAGNOSIS — R1030 Lower abdominal pain, unspecified: Secondary | ICD-10-CM | POA: Diagnosis not present

## 2015-10-26 DIAGNOSIS — R1031 Right lower quadrant pain: Secondary | ICD-10-CM

## 2015-10-26 LAB — CBC WITH DIFFERENTIAL/PLATELET
BASOS ABS: 0 10*3/uL (ref 0.0–0.1)
Basophils Relative: 0.4 % (ref 0.0–3.0)
EOS ABS: 0.1 10*3/uL (ref 0.0–0.7)
Eosinophils Relative: 1.1 % (ref 0.0–5.0)
HCT: 40.4 % (ref 36.0–46.0)
Hemoglobin: 13.5 g/dL (ref 12.0–15.0)
LYMPHS ABS: 2.2 10*3/uL (ref 0.7–4.0)
LYMPHS PCT: 38.9 % (ref 12.0–46.0)
MCHC: 33.4 g/dL (ref 30.0–36.0)
MCV: 90.2 fl (ref 78.0–100.0)
Monocytes Absolute: 0.4 10*3/uL (ref 0.1–1.0)
Monocytes Relative: 6.8 % (ref 3.0–12.0)
NEUTROS ABS: 3 10*3/uL (ref 1.4–7.7)
Neutrophils Relative %: 52.8 % (ref 43.0–77.0)
PLATELETS: 281 10*3/uL (ref 150.0–400.0)
RBC: 4.49 Mil/uL (ref 3.87–5.11)
RDW: 12.5 % (ref 11.5–15.5)
WBC: 5.7 10*3/uL (ref 4.0–10.5)

## 2015-10-26 LAB — COMPREHENSIVE METABOLIC PANEL
ALBUMIN: 4.5 g/dL (ref 3.5–5.2)
ALT: 10 U/L (ref 0–35)
AST: 16 U/L (ref 0–37)
Alkaline Phosphatase: 56 U/L (ref 39–117)
BILIRUBIN TOTAL: 0.3 mg/dL (ref 0.2–1.2)
BUN: 14 mg/dL (ref 6–23)
CALCIUM: 9.2 mg/dL (ref 8.4–10.5)
CHLORIDE: 102 meq/L (ref 96–112)
CO2: 32 meq/L (ref 19–32)
Creatinine, Ser: 0.69 mg/dL (ref 0.40–1.20)
GFR: 95.74 mL/min (ref >60.00)
Glucose, Bld: 97 mg/dL (ref 70–99)
Potassium: 3.3 mEq/L — ABNORMAL LOW (ref 3.5–5.1)
Sodium: 140 mEq/L (ref 135–145)
Total Protein: 6.6 g/dL (ref 6.0–8.3)

## 2015-10-26 NOTE — Progress Notes (Signed)
Pre visit review using our clinic review tool, if applicable. No additional management support is needed unless otherwise documented below in the visit note. 

## 2015-10-26 NOTE — Progress Notes (Signed)
Subjective:    Patient ID: Laurie [name], female    DOB: 1965-12-09, 50 y.o.   MRN: [id number]  HPI  Laurie [name] is a 50 year old female who presents today with a chief complaint of abdominal pain. Her pain is located to the right lower groin, specifically. She experienced this pain in the past and completed a pelvic US about 6 months ago per GYN, with evidence of possible "reminents" of ovarian cyst. She continues to experience pain to her right groin since which has gradually become worse. She feels as though there's a tennis ball located to her right groin. She's taken Motrin for her pain with some improvement. Denies fevers, nausea/vomiting, diarrhea, weakness, vaginal bleeding.   She does report that her diarrhea is less frequent in the past 6 months since pain began, however, she does have daily bowel movements without difficulty.  Review of Systems  Constitutional: Positive for fatigue. Negative for fever and chills.  Gastrointestinal: Negative for nausea, vomiting, diarrhea and constipation.       Right groin pain  Neurological: Negative for dizziness and weakness.       Past Medical History  Diagnosis Date  . Migraine   . Chronic neck pain     from disc dz  . Anxiety     situational  . Insomnia     organic sleep disorder, failed multiple meds  . IBS (irritable bowel syndrome)     diarrhea predominant    Social History   Social History  . Marital Status: Divorced    Spouse Name: N/A  . Number of Children: N/A  . Years of Education: N/A   Occupational History  . Not on file.   Social History Main Topics  . Smoking status: Never Smoker   . Smokeless tobacco: Never Used  . Alcohol Use: No  . Drug Use: No  . Sexual Activity: Not on file   Other Topics Concern  . 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   She lives alone.     Highest level of education:  Some college    Past Surgical History  Procedure  Laterality Date  . Knee surgery    . Epidural injections    . Deg c-s disk/spinal stenosis  06/2007  . Abdominal hysterectomy      Family History  Problem Relation Age of Onset  . COPD Father     Deceased, 68  . Heart disease Father     CAD  . Heart disease Other     CAD  . Heart disease Other     massive MI  . Migraines Father   . Migraines Brother   . Thyroid disease Mother     Living, 78  . Migraines Daughter     Allergies  Allergen Reactions  . Codeine     REACTION: vomiting  . Codeine Phosphate     REACTION: vomiting  . Penicillins     REACTION: urticaria (hives)    Current Outpatient Prescriptions on File Prior to Visit  Medication Sig Dispense Refill  . baclofen (LIORESAL) 10 MG tablet TAKE ONE TABLET BY MOUTH AT BEDTIME AS NEEDED FOR MUSCLE SPASMS 20 tablet 0  . butalbital-acetaminophen-caffeine (FIORICET, ESGIC) 50-325-40 MG tablet TAKE ONE TABLET BY MOUTH TWICE DAILY AS NEEDED FOR HEADACHE OR  MIGRAINE 20 tablet 0  . ketorolac (TORADOL) 10 MG tablet Take 10 mg by mouth every 6 (six) hours as  needed.      . nortriptyline (PAMELOR) 10 MG capsule Take 2 capsules (20 mg total) by mouth at bedtime. 60 capsule 3  . ondansetron (ZOFRAN) 4 MG tablet Take 4 mg by mouth every 8 (eight) hours as needed for nausea or vomiting.    . rizatriptan (MAXALT) 5 MG tablet Take 1 tablet (5 mg total) by mouth as needed for migraine. May repeat in 2 hours if needed 10 tablet 0  . zolpidem (AMBIEN) 10 MG tablet TAKE ONE-HALF TO ONE TABLET BY MOUTH AT BEDTIME AS NEEDED FOR SLEEP 30 tablet 1  . ciprofloxacin (CIPRO) 250 MG tablet Take 1 tablet (250 mg total) by mouth 2 (two) times daily. For 5 days (Patient not taking: Reported on 10/26/2015) 10 tablet 0   No current facility-administered medications on file prior to visit.    BP 146/88 mmHg  Pulse 78  Temp(Src) 97.9 F (36.6 C) (Oral)  Wt 135 lb 6.4 oz (61.417 kg)  SpO2 98%    Objective:   Physical Exam  Constitutional: She  appears well-nourished.  Cardiovascular: Normal rate and regular rhythm.   Pulmonary/Chest: Effort normal and breath sounds normal.  Abdominal: There is no rebound and negative Murphy's sign.  Tenderness to right lower groin.  Skin: Skin is warm and dry.          Assessment & Plan:  Abdominal Pain:  Located to right lower groin, pain gradually worse from 6 months ago. Pelvic US and Transvagional US completed per GYN 6 months ago without obvious abnormality per patient.  Mild tenderness on exam to right lower groin region. No suspicion for appendix or GI involvement at this point.  Suspect ovarian cyst and will investigate with ultrasound. Ordered pelvic and transvaginal US. Also CMP and CBC pending. If [place] normal, will consider GYN or GI referral depending on results.

## 2015-10-26 NOTE — Patient Instructions (Signed)
Complete lab work prior to leaving today. I will notify you of your results once received.   Stop by the front and speak with [facility] regarding your ultrasound appointment. I will call you once I receive these results.  It was a pleasure meeting you!

## 2015-10-27 ENCOUNTER — Telehealth: Payer: Self-pay

## 2015-10-27 NOTE — Telephone Encounter (Signed)
Pt left v/m; pt seen 10/26/15 and scheduled for pelvic and transvaginal US on 10/28/15; pt thinks she knows what is going on and may be able to cancel [place]. Pt started looking up symptoms on internet;pt said rectocele is exactly what the symptoms sounds like to her. When pt tries to have BM pt said she has difficulty going and it feels like it is going into a pocket; Pt said she feels like she has  a tennis ball with constant pressure. Pt wants to know if has to have [place]. Pt request cb.

## 2015-10-27 NOTE — Telephone Encounter (Signed)
A rectocele is not typically seen in the abdominal cavity, it's located in the vagina. She would definitley need to follow up with GYN if this were the case. Does she have a GYN? If so she will need to follow up. I still recommend the [place], but she can wait after evaluation with GYN.

## 2015-10-28 ENCOUNTER — Ambulatory Visit
Admission: RE | Admit: 2015-10-28 | Discharge: 2015-10-28 | Disposition: A | Discharge status: Home / Self Care | Payer: Commercial Managed Care - PPO | Source: Ambulatory Visit | Attending: Primary Care | Admitting: Primary Care

## 2015-10-28 DIAGNOSIS — Z90710 Acquired absence of both cervix and uterus: Secondary | ICD-10-CM | POA: Insufficient documentation

## 2015-10-28 DIAGNOSIS — N83201 Unspecified ovarian cyst, right side: Secondary | ICD-10-CM | POA: Insufficient documentation

## 2015-10-28 DIAGNOSIS — R1030 Lower abdominal pain, unspecified: Secondary | ICD-10-CM | POA: Diagnosis present

## 2015-10-28 DIAGNOSIS — R1031 Right lower quadrant pain: Secondary | ICD-10-CM

## 2015-10-28 NOTE — Telephone Encounter (Signed)
Called and notified patient of Kate's comments. Patient verbalized understanding. Also patient decide to ho ahead and get the the ultrasound today.

## 2015-10-29 ENCOUNTER — Other Ambulatory Visit: Payer: Self-pay | Admitting: Primary Care

## 2015-10-29 ENCOUNTER — Telehealth: Payer: Self-pay | Admitting: Family Medicine

## 2015-10-29 DIAGNOSIS — N83201 Unspecified ovarian cyst, right side: Secondary | ICD-10-CM

## 2015-10-29 NOTE — Telephone Encounter (Signed)
I left a message on patient's voice mail that Vallarie Mare will call her back with results.

## 2015-10-29 NOTE — Telephone Encounter (Signed)
Patient called to find out the results of her Ultrasounds.

## 2015-10-29 NOTE — Telephone Encounter (Signed)
Called and notified patient of Kate's comments. Patient verbalized understanding.  

## 2015-10-29 NOTE — Telephone Encounter (Signed)
I've sent a phone note to Laurie [name] earlier this morning to call Ms. Chhim. We've been in [place] all day and will call her with results as soon as possible. Thanks.

## 2015-10-30 ENCOUNTER — Telehealth: Payer: Self-pay | Admitting: Family Medicine

## 2015-10-30 NOTE — Telephone Encounter (Signed)
Called and verify with pharmacist at [facility] on [address] if they received the phone of tramadol yesterday. I was told they received it but it was not fill yet. Pharmacist will fill Rx.  Also left the voicemail for patient.

## 2015-10-30 NOTE — Telephone Encounter (Signed)
Pt called inquiring about a rx of tramadol being sent into the pharmacy for her. She said she spoke with Vallarie Mare regarding this yesterday. You can reach her at [phone].

## 2015-10-30 NOTE — Telephone Encounter (Signed)
Patient called back. Notified patient of the comments below.

## 2015-11-05 ENCOUNTER — Other Ambulatory Visit: Payer: Self-pay | Admitting: Gastroenterology

## 2015-11-05 ENCOUNTER — Telehealth: Payer: Self-pay

## 2015-11-05 DIAGNOSIS — R1031 Right lower quadrant pain: Secondary | ICD-10-CM

## 2015-11-05 MED ORDER — TRAMADOL HCL 50 MG PO TABS: 50.0000 mg | ORAL_TABLET | Freq: Three times a day (TID) | ORAL | Status: DC | PRN

## 2015-11-05 NOTE — Telephone Encounter (Signed)
I am happy to do a short term treatment of pain with Tramadol, but after this will need to discuss further direction with GI or PCP. Vallarie Mare, please call in Tramadol 50 mg. Take 1 tablet by mouth every 8 hours as needed for severe pain. #30, no refills. Thanks!

## 2015-11-05 NOTE — Telephone Encounter (Signed)
Pt left /vm; pt has already seen GYN and then sent to GI. Pt saw GI this AM and pt scheduled for CT scan on 11/06/15 and colonoscopy to follow end of month; pt request tramadol to help pt with pain until all testing done.pt was seen 10/26/15 with Allie Bossier NP. Pt request cb.

## 2015-11-05 NOTE — Telephone Encounter (Signed)
Called and notified patient of Kate's comments. Patient verbalized understanding. Phone in Tramadol to [place].

## 2015-11-06 ENCOUNTER — Ambulatory Visit
Admission: RE | Admit: 2015-11-06 | Discharge: 2015-11-06 | Disposition: A | Discharge status: Home / Self Care | Payer: Commercial Managed Care - PPO | Source: Ambulatory Visit | Attending: Gastroenterology | Admitting: Gastroenterology

## 2015-11-06 DIAGNOSIS — R1031 Right lower quadrant pain: Secondary | ICD-10-CM

## 2015-11-06 MED ORDER — IOPAMIDOL (ISOVUE-300) INJECTION 61%
100.0000 mL | Freq: Once | INTRAVENOUS | Status: AC | PRN
  Administered 2015-11-06: 100 mL via INTRAVENOUS

## 2015-11-09 ENCOUNTER — Other Ambulatory Visit: Payer: Self-pay | Admitting: Family Medicine

## 2015-11-09 NOTE — Telephone Encounter (Signed)
Px written for call in   

## 2015-11-09 NOTE — Telephone Encounter (Signed)
Electronic refill request, pt has had a recent acute appt with Allie Bossier, NP but no recent CPE or f/u appt., last refilled on 09/09/15 #30 with 1 additional refill, please advise

## 2015-11-10 NOTE — Telephone Encounter (Signed)
Rx called in as prescribed 

## 2015-11-11 ENCOUNTER — Telehealth: Payer: Self-pay | Admitting: *Deleted

## 2015-11-11 NOTE — Telephone Encounter (Signed)
Patient called to discuss results from 1/20 CT ordered by GI.  She is still having intermittent right abdominal pain and right lower back pain that is relieved with tramadol q6 hours.  The pain is becoming more frequent.  She reports changes in her bowels: constipation.  She is scheduled for colonoscopy next week.  She was able to view a copy her CT results and is concerned with the possibility of pancreatic cancer.  Her follow up with GI is not scheduled until after the colonoscopy.  She would like to speak with Dr. Glori Bickers to discuss these results.

## 2015-11-11 NOTE — Telephone Encounter (Signed)
I called her and we reviewed the report  Reassuring there was no mass seen  Reassuring cmp was normal  Adv her to call GI in the am and get someone to call her back re: further clarification and also regarding her abd pain

## 2015-11-17 ENCOUNTER — Telehealth: Payer: Self-pay | Admitting: Family Medicine

## 2015-11-17 MED ORDER — TRAMADOL HCL 50 MG PO TABS
50.0000 mg | ORAL_TABLET | Freq: Four times a day (QID) | ORAL | Status: DC | PRN
Start: 2015-11-17 — End: 2016-01-13

## 2015-11-17 NOTE — Telephone Encounter (Signed)
Please also tell her that every 6 hours is the max she can take for safety- every 4 hours is too often (sorry)  When she sees GI please make sure they are aware how severe your pain is also  Good luck with the colonoscopy

## 2015-11-17 NOTE — Telephone Encounter (Addendum)
Pt left v/m; pt scheduled for colonoscopy on 11/18/15 and appt at end of week with GI doctor; pt has 2 days of tramadol left; pt taking tramadol q 4 -6 hours; pt spoke with Dr Glori Bickers and advised that pain had increased. Pt request refill tramadol.[facility] [email or web address].

## 2015-11-17 NOTE — Telephone Encounter (Signed)
Px written for call in   

## 2015-11-17 NOTE — Telephone Encounter (Signed)
error 

## 2015-11-17 NOTE — Addendum Note (Signed)
Addended by: Loura Pardon A on: 11/17/2015 05:17 PM   Modules accepted: Orders

## 2015-11-18 NOTE — Telephone Encounter (Signed)
Rx called in and pt notified and advise of Dr. Marliss Coots instructions and recommendations and verbalized understanding

## 2015-11-20 ENCOUNTER — Other Ambulatory Visit: Payer: Self-pay | Admitting: Gastroenterology

## 2015-11-20 DIAGNOSIS — R935 Abnormal findings on diagnostic imaging of other abdominal regions, including retroperitoneum: Secondary | ICD-10-CM

## 2015-11-23 ENCOUNTER — Other Ambulatory Visit: Payer: Self-pay | Admitting: *Deleted

## 2015-11-23 ENCOUNTER — Other Ambulatory Visit: Payer: Self-pay | Admitting: Neurology

## 2015-11-23 MED ORDER — RIZATRIPTAN BENZOATE 5 MG PO TABS
5.0000 mg | ORAL_TABLET | ORAL | Status: DC | PRN
Start: 2015-11-23 — End: 2016-01-13

## 2015-11-23 NOTE — Telephone Encounter (Signed)
Rx refused.  Patient needs an appointment. 

## 2015-11-27 ENCOUNTER — Ambulatory Visit
Admission: RE | Admit: 2015-11-27 | Discharge: 2015-11-27 | Disposition: A | Discharge status: Home / Self Care | Payer: Commercial Managed Care - PPO | Source: Ambulatory Visit | Attending: Gastroenterology | Admitting: Gastroenterology

## 2015-11-27 DIAGNOSIS — R935 Abnormal findings on diagnostic imaging of other abdominal regions, including retroperitoneum: Secondary | ICD-10-CM

## 2015-11-27 MED ORDER — GADOBENATE DIMEGLUMINE 529 MG/ML IV SOLN
10.0000 mL | Freq: Once | INTRAVENOUS | Status: AC | PRN
  Administered 2015-11-27: 10 mL via INTRAVENOUS

## 2015-12-28 ENCOUNTER — Other Ambulatory Visit: Payer: Self-pay | Admitting: Neurology

## 2015-12-28 NOTE — Telephone Encounter (Signed)
Rx sent 

## 2016-01-04 ENCOUNTER — Telehealth: Payer: Self-pay

## 2016-01-04 MED ORDER — ZOLPIDEM TARTRATE 10 MG PO TABS: 5.0000 mg | ORAL_TABLET | Freq: Every evening | ORAL | Status: DC | PRN

## 2016-01-04 NOTE — Telephone Encounter (Signed)
Pt left v/m requesting refill ambien to [facility]. Last seen 10/26/15 for acute visit and last refilled # 30 x 1 on 11/09/15.Please advise.

## 2016-01-04 NOTE — Telephone Encounter (Signed)
Px written for call in   

## 2016-01-05 NOTE — Telephone Encounter (Signed)
Rx called in as prescribed 

## 2016-01-13 ENCOUNTER — Encounter: Payer: Self-pay | Admitting: Neurology

## 2016-01-13 ENCOUNTER — Ambulatory Visit (INDEPENDENT_AMBULATORY_CARE_PROVIDER_SITE_OTHER): Payer: Commercial Managed Care - PPO | Admitting: Neurology

## 2016-01-13 VITALS — BP 110/78 | HR 92 | Ht 63.0 in | Wt 126.4 lb

## 2016-01-13 DIAGNOSIS — R519 Headache, unspecified: Secondary | ICD-10-CM

## 2016-01-13 DIAGNOSIS — G43009 Migraine without aura, not intractable, without status migrainosus: Secondary | ICD-10-CM | POA: Diagnosis not present

## 2016-01-13 DIAGNOSIS — R51 Headache: Secondary | ICD-10-CM

## 2016-01-13 MED ORDER — NORTRIPTYLINE HCL 10 MG PO CAPS: 10.0000 mg | ORAL_CAPSULE | Freq: Every day | ORAL | Status: DC

## 2016-01-13 MED ORDER — RIZATRIPTAN BENZOATE 5 MG PO TABS: 5.0000 mg | ORAL_TABLET | ORAL | Status: DC | PRN

## 2016-01-13 MED ORDER — BUTALBITAL-APAP-CAFFEINE 50-325-40 MG PO TABS: ORAL_TABLET | ORAL | Status: DC

## 2016-01-13 MED ORDER — FLURBIPROFEN 50 MG PO TABS: 50.0000 mg | ORAL_TABLET | Freq: Two times a day (BID) | ORAL | Status: DC | PRN

## 2016-01-13 MED ORDER — ONDANSETRON HCL 4 MG PO TABS: 4.0000 mg | ORAL_TABLET | Freq: Three times a day (TID) | ORAL | Status: DC | PRN

## 2016-01-13 NOTE — Patient Instructions (Signed)
Great to see you today!  Keep up the good work. Try to limit fioricet, otherwise continue your medications.  Try ansaid for severe headaches 1 tablet twice daily as needed.   Limit all rescue headache medications to twice per week.  Return to clinic in 1 year

## 2016-01-13 NOTE — Progress Notes (Signed)
Follow-up Visit   Date: 01/13/2016    Laurie [name] MRN: [id number] DOB: 1965-11-19   Interim History: Laurie [name] is a 50 y.o. right-handed Caucasian female with migraines and insomnia returning to the clinic for follow-up of chronic daily headaches.  The patient was accompanied to the clinic by self.  History of present illness: Headaches started in her early 46s after blunt head injury from weight-boarding. There was no loss of consciousness. She had MRI of the neck which showed mild disc degeneration. Since then, headaches periodically worsened so received steroid injections to her neck which helped. She also tried hormone replacement that helped her pain. She has tried Lyrica in the past but it was stopped due to cognitive side effects. Imitrex did not work. Usually, she would get a headache once every three months, lasting 1-2 hours if she was able to take toradol and maxalt. Pain is located over the left temporal region, described as throbbing, and incapacitating. She endorses significant photosensitivity, nausea, vomiting, and phonophobia. She usually has associated neck tightness. Rest improves headaches and is worsened by stress and exertion. She tried neck physical therapy which exacerbated symptoms. No numbness/tingling, weakness, or vision changes.   Since early 2015, her headaches have become more frequent and more intense. She reports starting her new job in January and although she really enjoys it, it is definitely a new stress. Headaches are now occuring almost daily, with only 1-2 headache-free days per week since January 2015. Pain is ranked as 8-9/10 when severe, and 5-6/10 daily. She saw her Marshall Cork, NP in April at which time CT head was normal and she was given toradol and depomedrol injection which did not help, so saw Dr. Glori Bickers in early May who started her on elavil. She feels that the headaches have improved since taking Elavil, because she has  more headache-free days than pain days. She is currently taking fioricet twice daily for the past two-months which has improved her headaches, but feels that if she does not take fioricet, headaches return.   UPDATE 09/19/2014:  She increased topamax to 50mg  daily and was able to stop amitriptyline, but since stopping amitriptyline she reports her neck pain is worse.  She has tried flexeril in the past, but develop GI side effects.  She continues to take Fioricet about three times per month.  She had taken maxalt about twice per week.  Overall, she feels that her headaches are worse especially over the past month, because they are occuring daily.  She had one urgent care visit after developing a rebound headache to fioricet.  After Thanksgiving holiday, she had severe headache that kept her home all week.    UPDATE 01/13/2015:  She stopped topamax because of GI upset and started nortriptyline which seems to reduce the intensity of the headaches. She is now getting severe headaches every 2 weeks, which is a huge improvement.  She seems to tolerate the medication well and reports only fatigue.  She is using much less of the fioricet and maxalt, but at times the fioricet is the only medication that helps.  Overall, her use of rescue medications has improved.  UPDATE 01/13/2016:  She is here for her annual follow-up.  She gets headaches about twice per week. She tried taking nortriptyline 20mg  but reported increased sedation, so has been on 10mg  at bedtime.  She has not missed any days of work, which is great!  She reports having her ear pierced which has helped and now no  longer gets severe migraines.  She is staying active and walking 15 miles per week and going to the gym regularly.  She no longer drinks sodas, only water and unsweetened.   Medications:  Current Outpatient Prescriptions on File Prior to Visit  Medication Sig Dispense Refill  . baclofen (LIORESAL) 10 MG tablet TAKE ONE TABLET BY MOUTH AT  BEDTIME AS NEEDED FOR  MUSCLE  SPASM 20 tablet 0  . zolpidem (AMBIEN) 10 MG tablet Take 0.5 tablets (5 mg total) by mouth at bedtime as needed. 30 tablet 1   No current facility-administered medications on file prior to visit.    Allergies:  Allergies  Allergen Reactions  . Codeine     REACTION: vomiting  . Codeine Phosphate     REACTION: vomiting  . Penicillins     REACTION: urticaria (hives)    Review of Systems:  CONSTITUTIONAL: No fevers, chills, night sweats, or weight loss.  EYES: No visual changes or eye pain ENT: No hearing changes.  No history of nose bleeds.   RESPIRATORY: No cough, wheezing and shortness of breath.   CARDIOVASCULAR: Negative for chest pain, and palpitations.   GI: Negative for abdominal discomfort, blood in stools or black stools.  No recent change in bowel habits.   GU:  No history of incontinence.   MUSCLOSKELETAL: No history of joint pain or swelling.  No myalgias.   SKIN: Negative for lesions, rash, and itching.   ENDOCRINE: Negative for cold or heat intolerance, polydipsia or goiter.   PSYCH:  No depression +anxiety symptoms.   NEURO: As Above.   Vital Signs:  BP 110/78 mmHg  Pulse 92  Ht 5\' 3"  (1.6 m)  Wt 126 lb 7 oz (57.352 kg)  BMI 22.40 kg/m2  SpO2 93%  Neurological Exam: MENTAL STATUS including orientation to time, place, person, recent and remote memory, attention span and concentration, language, and fund of knowledge is normal.  Speech is not dysarthric.  CRANIAL NERVES:  Pupils equal round and reactive to light.  Normal conjugate, extra-ocular eye movements in all directions of gaze. No ptosis.   Face is symmetric.   MOTOR:  Motor strength is 5/5 in all extremities.  Normal tone.  REFLEXES:  Symmetric and 2+/4 throughout upper and lower extremties.   COORDINATION/GAIT:  Gait narrow based and stable.   Data: CT head 02/10/2014:  Negative   IMPRESSION: 1.  Chronic daily headaches, overall doing well with improved headache  frequency now only to twice per week but still using fioricet for these breakthrough headaches, which she has cut back, but still would prefer her completely off this medication  - Previously tried:  Amitriptyline 20mg  (improved headaches, but stopped due to cognitive side effects), TPM (GI upset), Lyrica  - Doing well on nortriptyline 10mg  qhs  2.  Cervicalgia, improved with home exercises  - OK to use baclofen 10mg  as needed for neck pain  3.  Episodic migraine without aura  - Use baclofen, zofran, and benadryl prn  - Rx given for fioricet but stressed limiting the use of this  4.  Medication over use headaches - resolved   PLAN/RECOMMENDATIONS:  1.  Continue nortriptyline 10mg  at bedtime 2.  For severe headaches, ok to use baclofen 5-10mg , zofran, and benadryl 25mg  3.  For severe migraine, use maxalt 5mg  at headache onset 4.  She was advised to limit fioricet and given a trial of Ansaid 50mg  to use as rescue medication 5.  Limit all rescue medications to twice per  week  Return to clinic 1 year   The duration of this appointment visit was 25 minutes of face-to-face time with the patient.  Greater than 50% of this time was spent in counseling, explanation of diagnosis, planning of further management, and coordination of care.   Thank you for allowing me to participate in patient's care.  If I can answer any additional questions, I would be pleased to do so.    Sincerely,    Lissandra Keil K. Posey Pronto, DO   Cc: Claiborne Billings, NP

## 2016-01-20 MED ORDER — ZOLPIDEM TARTRATE 10 MG PO TABS: 10.0000 mg | ORAL_TABLET | Freq: Every evening | ORAL | Status: DC | PRN

## 2016-01-20 NOTE — Addendum Note (Signed)
Addended by: Loura Pardon A on: 01/20/2016 03:16 PM   Modules accepted: Orders

## 2016-01-20 NOTE — Telephone Encounter (Signed)
Pt left v/m; pt said when Lorrin Mais was called in on 01/05/16 instructions were 1/2 pill at hs prn;(these are instructions on current med list) pt said she was only given # 15 and pt cannot sleep taking 1/2 tab. Pt said in past instructions were 1/2 - 1 tab po at hs prn. Pt request new rx sent to [facility].

## 2016-01-20 NOTE — Telephone Encounter (Signed)
Rx called in as prescribed and pt notified  

## 2016-01-20 NOTE — Telephone Encounter (Signed)
Px written for call in   

## 2016-03-23 ENCOUNTER — Other Ambulatory Visit: Payer: Self-pay | Admitting: *Deleted

## 2016-03-23 ENCOUNTER — Telehealth: Payer: Self-pay | Admitting: Neurology

## 2016-03-23 MED ORDER — BUTALBITAL-APAP-CAFFEINE 50-325-40 MG PO TABS: ORAL_TABLET | ORAL | Status: DC

## 2016-03-23 NOTE — Telephone Encounter (Signed)
Patient informed that Dr. Posey Pronto is out of the office until tomorrow but that I will fax Rx in the morning.

## 2016-03-23 NOTE — Telephone Encounter (Signed)
Laurie [name] [birth year]. She was calling to follow up on a prescription refill on Fiorcet. She uses [facility] on [address]. Her number is [phone]. Thank you

## 2016-03-24 ENCOUNTER — Telehealth: Payer: Self-pay | Admitting: Neurology

## 2016-03-24 NOTE — Telephone Encounter (Signed)
Laurie [name] 2065-12-04 she was calling back about having her medication called in. She said she is in a lot of pain. I did tell her Dr. Posey Pronto was out of the office yesterday afternoon.

## 2016-03-24 NOTE — Telephone Encounter (Signed)
Rx has been sent  

## 2016-04-14 ENCOUNTER — Other Ambulatory Visit: Payer: Self-pay | Admitting: Neurology

## 2016-04-14 NOTE — Telephone Encounter (Signed)
Note sent for PCP to follow this Rx.

## 2016-04-28 ENCOUNTER — Other Ambulatory Visit: Payer: Self-pay | Admitting: *Deleted

## 2016-04-28 MED ORDER — ONDANSETRON HCL 4 MG PO TABS: 4.0000 mg | ORAL_TABLET | Freq: Three times a day (TID) | ORAL | Status: DC | PRN

## 2016-05-11 ENCOUNTER — Other Ambulatory Visit: Payer: Self-pay | Admitting: Family Medicine

## 2016-05-11 NOTE — Telephone Encounter (Signed)
Px written for call in   

## 2016-05-11 NOTE — Telephone Encounter (Signed)
Pt left vm requesting status of refill ambien to [facility]. Pt last acute visit 03/04/15; no future appt scheduled. Last refilled # 30 x 3 on 01/20/16.

## 2016-05-12 ENCOUNTER — Other Ambulatory Visit: Payer: Self-pay | Admitting: Family Medicine

## 2016-05-12 NOTE — Telephone Encounter (Signed)
Rx called in as prescribed 

## 2016-05-25 ENCOUNTER — Ambulatory Visit (INDEPENDENT_AMBULATORY_CARE_PROVIDER_SITE_OTHER): Payer: Commercial Managed Care - PPO | Admitting: Family Medicine

## 2016-05-25 ENCOUNTER — Encounter: Payer: Self-pay | Admitting: Family Medicine

## 2016-05-25 VITALS — BP 124/72 | HR 62 | Temp 97.8°F | Ht 63.0 in | Wt 124.8 lb

## 2016-05-25 DIAGNOSIS — F4323 Adjustment disorder with mixed anxiety and depressed mood: Secondary | ICD-10-CM | POA: Diagnosis not present

## 2016-05-25 DIAGNOSIS — R51 Headache: Secondary | ICD-10-CM

## 2016-05-25 DIAGNOSIS — R519 Headache, unspecified: Secondary | ICD-10-CM

## 2016-05-25 MED ORDER — DULOXETINE HCL 30 MG PO CPEP: 30.0000 mg | ORAL_CAPSULE | Freq: Every day | ORAL | 5 refills | Status: DC

## 2016-05-25 NOTE — Assessment & Plan Note (Signed)
Causing poor sleep and work performance in pt with migraines Reviewed stressors/ coping techniques/symptoms/ support sources/ tx options and side effects in detail today Recommend counseling when ready Has failed tricyclic and ssri in the past Disc SNRI- will try cymbalta Discussed expectations of SNRI medication including time to effectiveness and mechanism of action, also poss of side effects (early and late)- including mental fuzziness, weight or appetite change, nausea and poss of worse dep or anxiety (even suicidal thoughts)  Pt voiced understanding and will stop med and update if this occurs   Will continue working on sleep hygiene  Will work to quit caffeine  F/u 2 months

## 2016-05-25 NOTE — Progress Notes (Signed)
Subjective:    Patient ID: Laurie [name], female    DOB: October 05, 1966, 50 y.o.   MRN: [id number]  HPI Here for f/u of chronic medical problems Wt Readings from Last 3 Encounters:  05/25/16 124 lb 12 oz (56.6 kg)  01/13/16 126 lb 7 oz (57.4 kg)  10/26/15 135 lb 6.4 oz (61.4 kg)   bmi is 22.1   Needs more energy and better sleep at night   Now she is able to control headaches a bit better  Was on nortriptyline for headaches- and she thinks it made her depressed along with helping headaches Off of it- anxiety level is worse however   Having trouble going to sleep and then waking up frequently  Cannot sleep without ambien (and she does not feel like this worsens headaches) Takes it every night -and still has trouble with sleep  Takes 1/2 hour before bed , and does not eat late  She does read sometimes  Has practiced good sleep hygeine    Lives in a noisy area -has not tried ear plugs  Has not tried a sleep mask    Going to the gym and getting back into shape    A lot of stress - went through a bad divorce and now in a relationship for 2 y Is irritable  Thinks she is anxious and depressed  She saw a counselor in the beginning - things did improve some  Is irritable Is insecure  Fear of infidelity again   She has tried paxil and zoloft - improved mood  Both caused weight gain and inc appetite   Does sure if tried SNRI   Declines counseling    Had an ear piercing that helped her headaches    Patient Active Problem List   Diagnosis Date Noted  . Adjustment disorder with mixed anxiety and depressed mood 05/25/2016  . Breast lump in female 03/04/2015  . Pelvic pain in female 03/04/2015  . Frequent urination 03/04/2015  . Low back pain 03/04/2015  . Vitamin D deficiency 03/04/2015  . Chronic daily headache 02/19/2014  . DISORDERS, ORGANIC SLEEP NEC 06/06/2007  . [place] DISEASE 06/05/2007  . ANXIETY STATE NOS 04/04/2007  . MIGRAINE NOS W/O INTRACTABLE  MIGRAINE 04/04/2007  . NECK PAIN, CHRONIC 04/04/2007   Past Medical History:  Diagnosis Date  . Anxiety    situational  . Chronic neck pain    from disc dz  . IBS (irritable bowel syndrome)    diarrhea predominant  . Insomnia    organic sleep disorder, failed multiple meds  . Migraine    Past Surgical History:  Procedure Laterality Date  . ABDOMINAL HYSTERECTOMY    . Deg C-S disk/spinal stenosis  06/2007  . epidural injections    . KNEE SURGERY     Social History  Substance Use Topics  . Smoking status: Never Smoker  . Smokeless tobacco: Never Used  . Alcohol use 0.0 oz/week     Comment: occ/rare   Family History  Problem Relation Age of Onset  . COPD Father     Deceased, 48  . Heart disease Father     CAD  . Heart disease Other     CAD  . Heart disease Other     massive MI  . Migraines Father   . Migraines Brother   . Thyroid disease Mother     Living, 63  . Migraines Daughter    Allergies  Allergen Reactions  . Codeine  REACTION: vomiting  . Codeine Phosphate     REACTION: vomiting  . Penicillins     REACTION: urticaria (hives)   Current Outpatient Prescriptions on File Prior to Visit  Medication Sig Dispense Refill  . baclofen (LIORESAL) 10 MG tablet TAKE ONE TABLET BY MOUTH AT BEDTIME AS NEEDED FOR  MUSCLE  SPASM 20 tablet 0  . butalbital-acetaminophen-caffeine (FIORICET, ESGIC) 50-325-40 MG tablet TAKE ONE TABLET BY MOUTH TWICE DAILY AS NEEDED FOR HEADACHE OR  MIGRAINE 20 tablet 0  . flurbiprofen (ANSAID) 50 MG tablet Take 1 tablet (50 mg total) by mouth 2 (two) times daily as needed. 20 tablet 3  . ondansetron (ZOFRAN) 4 MG tablet Take 1 tablet (4 mg total) by mouth every 8 (eight) hours as needed for nausea or vomiting. 20 tablet 0  . rizatriptan (MAXALT) 5 MG tablet Take 1 tablet (5 mg total) by mouth as needed for migraine. May repeat in 2 hours if needed 10 tablet 0  . zolpidem (AMBIEN) 10 MG tablet TAKE ONE TABLET BY MOUTH AT BEDTIME AS  NEEDED 30 tablet 3   No current facility-administered medications on file prior to visit.      Review of Systems    Review of Systems  Constitutional: Negative for fever, appetite change, fatigue and unexpected weight change.  Eyes: Negative for pain and visual disturbance.  Respiratory: Negative for cough and shortness of breath.   Cardiovascular: Negative for cp or palpitations    Gastrointestinal: Negative for nausea, diarrhea and constipation.  Genitourinary: Negative for urgency and frequency.  Skin: Negative for pallor or rash   Neurological: Negative for weakness, light-headedness, numbness and pos for headaches.  Hematological: Negative for adenopathy. Does not bruise/bleed easily.  Psychiatric/Behavioral: pos for symptoms of anxiety and depression .      Objective:   Physical Exam  Constitutional: She appears well-developed and well-nourished. No distress.  HENT:  Head: Normocephalic and atraumatic.  Mouth/Throat: Oropharynx is clear and moist.  Eyes: Conjunctivae and EOM are normal. Pupils are equal, round, and reactive to light.  Neck: Normal range of motion. Neck supple. No JVD present. Carotid bruit is not present. No thyromegaly present.  Cardiovascular: Normal rate, regular rhythm, normal heart sounds and intact distal pulses.  Exam reveals no gallop.   Pulmonary/Chest: Effort normal and breath sounds normal. No respiratory distress. She has no wheezes. She has no rales.  No crackles  Abdominal: She exhibits no abdominal bruit.  Musculoskeletal: She exhibits no edema.  Lymphadenopathy:    She has no cervical adenopathy.  Neurological: She is alert. She has normal reflexes. No cranial nerve deficit. She exhibits normal muscle tone. Coordination normal.  Skin: Skin is warm and dry. No rash noted.  Psychiatric: Her speech is normal and behavior is normal. Her mood appears anxious. Her affect is not blunt, not labile and not inappropriate. Thought content is not  paranoid. She exhibits a depressed mood. She expresses no homicidal and no suicidal ideation.  Good insight Eager to discuss stressors           Assessment & Plan:   Problem List Items Addressed This Visit      Other   Chronic daily headache    Continue neuro f/u  Will try cymbalta for mood and see if this helps headaches more as well Disc lifestyle habits       Relevant Medications   DULoxetine (CYMBALTA) 30 MG capsule   Adjustment disorder with mixed anxiety and depressed mood    Causing  poor sleep and work performance in pt with migraines Reviewed stressors/ coping techniques/symptoms/ support sources/ tx options and side effects in detail today Recommend counseling when ready Has failed tricyclic and ssri in the past Disc SNRI- will try cymbalta Discussed expectations of SNRI medication including time to effectiveness and mechanism of action, also poss of side effects (early and late)- including mental fuzziness, weight or appetite change, nausea and poss of worse dep or anxiety (even suicidal thoughts)  Pt voiced understanding and will stop med and update if this occurs   Will continue working on sleep hygiene  Will work to quit caffeine  F/u 2 months        Other Visit Diagnoses   None.

## 2016-05-25 NOTE — Patient Instructions (Addendum)
Start generic cymbalta 30 mg once daily for mood and headaches and sleep  Take it with food If any intolerable side effects or if headaches or mood worsen- stop the medicine and let me know  Let us know if you want to see a counselor  Start writing in a journal before bed also  Stop all caffeine Keep exercising and take care of yourself Follow up with me in about 2 months

## 2016-05-25 NOTE — Progress Notes (Signed)
Pre visit review using our clinic review tool, if applicable. No additional management support is needed unless otherwise documented below in the visit note. 

## 2016-05-26 NOTE — Assessment & Plan Note (Signed)
Continue neuro f/u  Will try cymbalta for mood and see if this helps headaches more as well Disc lifestyle habits

## 2016-06-14 ENCOUNTER — Telehealth: Payer: Self-pay

## 2016-06-14 MED ORDER — DULOXETINE HCL 60 MG PO CPEP: 60.0000 mg | ORAL_CAPSULE | Freq: Every day | ORAL | 11 refills | Status: DC

## 2016-06-14 NOTE — Telephone Encounter (Signed)
I sent px for the 60 mg pill to take once daily to [facility]

## 2016-06-14 NOTE — Telephone Encounter (Signed)
Pt left v/m; pt seen 05/25/16; pt increased the cymbalta 30 mg taking 2 caps at hs. This has helped the neck pain and helping pt to sleep better. Pt request new rx with increased dosage. Pt request cb.  Please advise. [facility] [address].

## 2016-06-15 NOTE — Telephone Encounter (Signed)
Pt notified new dose sent to pharmacy

## 2016-08-16 ENCOUNTER — Other Ambulatory Visit: Payer: Self-pay | Admitting: Neurology

## 2016-08-16 NOTE — Telephone Encounter (Signed)
Rx sent 

## 2016-09-01 ENCOUNTER — Other Ambulatory Visit: Payer: Self-pay | Admitting: Family Medicine

## 2016-09-02 NOTE — Telephone Encounter (Signed)
Rx called in as prescribed 

## 2016-09-02 NOTE — Telephone Encounter (Signed)
Last OV was 05/25/16, last filled on 05/11/16 #30 with 3 additional refills (? If to early)

## 2016-09-02 NOTE — Telephone Encounter (Signed)
Pt called to ck on refill for ambien. She is wanting before the weekend.

## 2016-09-02 NOTE — Telephone Encounter (Signed)
Px written for call in   

## 2016-09-20 ENCOUNTER — Other Ambulatory Visit: Payer: Self-pay | Admitting: Neurology

## 2016-09-21 ENCOUNTER — Other Ambulatory Visit: Payer: Self-pay | Admitting: *Deleted

## 2016-09-21 MED ORDER — BACLOFEN 10 MG PO TABS: ORAL_TABLET | ORAL | 0 refills | Status: DC

## 2016-09-21 NOTE — Telephone Encounter (Signed)
Rx sent 

## 2016-10-05 ENCOUNTER — Telehealth: Payer: Self-pay | Admitting: Neurology

## 2016-10-05 ENCOUNTER — Other Ambulatory Visit: Payer: Self-pay | Admitting: *Deleted

## 2016-10-05 MED ORDER — BUTALBITAL-APAP-CAFFEINE 50-325-40 MG PO TABS: ORAL_TABLET | ORAL | 0 refills | Status: DC

## 2016-10-05 NOTE — Telephone Encounter (Signed)
Rx called in since printer is not working.

## 2016-10-05 NOTE — Telephone Encounter (Signed)
Laurie [name] Jan 15, 2066. She needs a refill called in for Fioricet to [facility]. Her # is [phone]. Thank you

## 2016-11-21 ENCOUNTER — Telehealth: Payer: Self-pay | Admitting: Family Medicine

## 2016-11-21 NOTE — Telephone Encounter (Signed)
That is fine with me if ok with Dr Kuneff 

## 2016-11-21 NOTE — Telephone Encounter (Signed)
Tried to call pt to schedule appt, MB was full and unable to leave message.

## 2016-11-21 NOTE — Telephone Encounter (Signed)
Pt asking to transfer care from Dr. Glori Bickers to Dr. Raoul Pitch. Pt states that she lives and works closer to the [employer]. Please advise ok to schedule.

## 2016-11-21 NOTE — Telephone Encounter (Signed)
It is fine with me

## 2016-12-13 ENCOUNTER — Encounter: Payer: Self-pay | Admitting: Family Medicine

## 2016-12-14 ENCOUNTER — Other Ambulatory Visit: Payer: Self-pay | Admitting: Family Medicine

## 2016-12-14 DIAGNOSIS — Z1231 Encounter for screening mammogram for malignant neoplasm of breast: Secondary | ICD-10-CM

## 2016-12-16 NOTE — Telephone Encounter (Signed)
Spoke with pt and she was not close to the office so she requested that I fax the orders to the fax # listed in prev mychart message. Done and pt aware

## 2016-12-29 ENCOUNTER — Ambulatory Visit
Admission: RE | Admit: 2016-12-29 | Discharge: 2016-12-29 | Disposition: A | Discharge status: Home / Self Care | Payer: Commercial Managed Care - PPO | Source: Ambulatory Visit | Attending: Family Medicine | Admitting: Family Medicine

## 2016-12-29 DIAGNOSIS — Z1231 Encounter for screening mammogram for malignant neoplasm of breast: Secondary | ICD-10-CM | POA: Diagnosis not present

## 2016-12-30 ENCOUNTER — Other Ambulatory Visit: Payer: Self-pay | Admitting: Family Medicine

## 2016-12-30 NOTE — Telephone Encounter (Signed)
Dr. Glori Bickers out of the office, pt had OV on 05/25/16, last filled on 09/02/16 #30 tabs with 3 additional refills

## 2016-12-30 NOTE — Telephone Encounter (Signed)
Please call in.  Thanks.   

## 2016-12-30 NOTE — Telephone Encounter (Signed)
Left refill on voice mail at pharmacy  

## 2017-01-02 DIAGNOSIS — R079 Chest pain, unspecified: Secondary | ICD-10-CM | POA: Insufficient documentation

## 2017-01-02 HISTORY — DX: Chest pain, unspecified: R07.9

## 2017-01-06 DIAGNOSIS — R079 Chest pain, unspecified: Secondary | ICD-10-CM | POA: Diagnosis not present

## 2017-01-06 DIAGNOSIS — E7800 Pure hypercholesterolemia, unspecified: Secondary | ICD-10-CM | POA: Diagnosis not present

## 2017-01-06 DIAGNOSIS — M79602 Pain in left arm: Secondary | ICD-10-CM | POA: Diagnosis not present

## 2017-01-11 DIAGNOSIS — R000 Tachycardia, unspecified: Secondary | ICD-10-CM | POA: Diagnosis not present

## 2017-01-16 DIAGNOSIS — E7800 Pure hypercholesterolemia, unspecified: Secondary | ICD-10-CM | POA: Diagnosis not present

## 2017-01-16 DIAGNOSIS — M79602 Pain in left arm: Secondary | ICD-10-CM | POA: Diagnosis not present

## 2017-01-16 DIAGNOSIS — R079 Chest pain, unspecified: Secondary | ICD-10-CM | POA: Diagnosis not present

## 2017-01-22 IMAGING — CR DG LUMBAR SPINE COMPLETE 4+V
5 series · 5 of 5 positions shown · non-contrast
Comparison: None.

CLINICAL DATA: Right-sided low back pain without sciatic symptoms,
no known injury but patient nodes swelling and along the right
aspect of the back.

EXAM:
LUMBAR SPINE - COMPLETE 4+ VIEW

[view not recorded (1 of 5)]
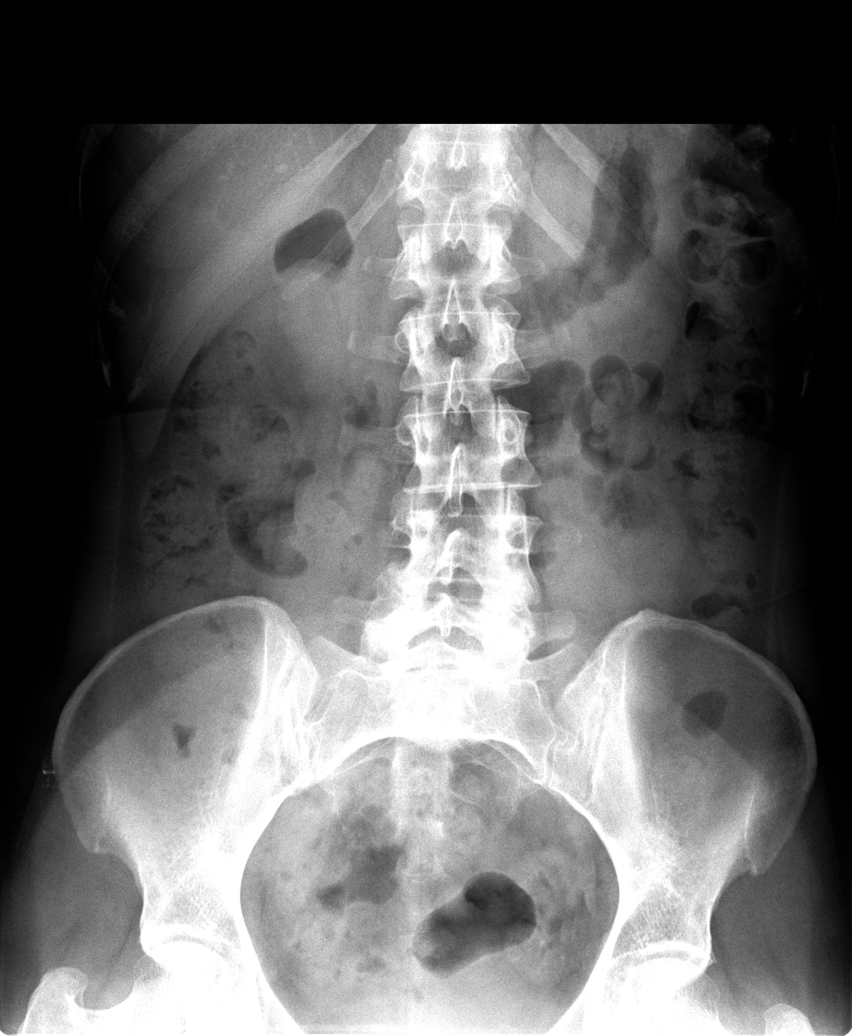

[view not recorded (2 of 5)]
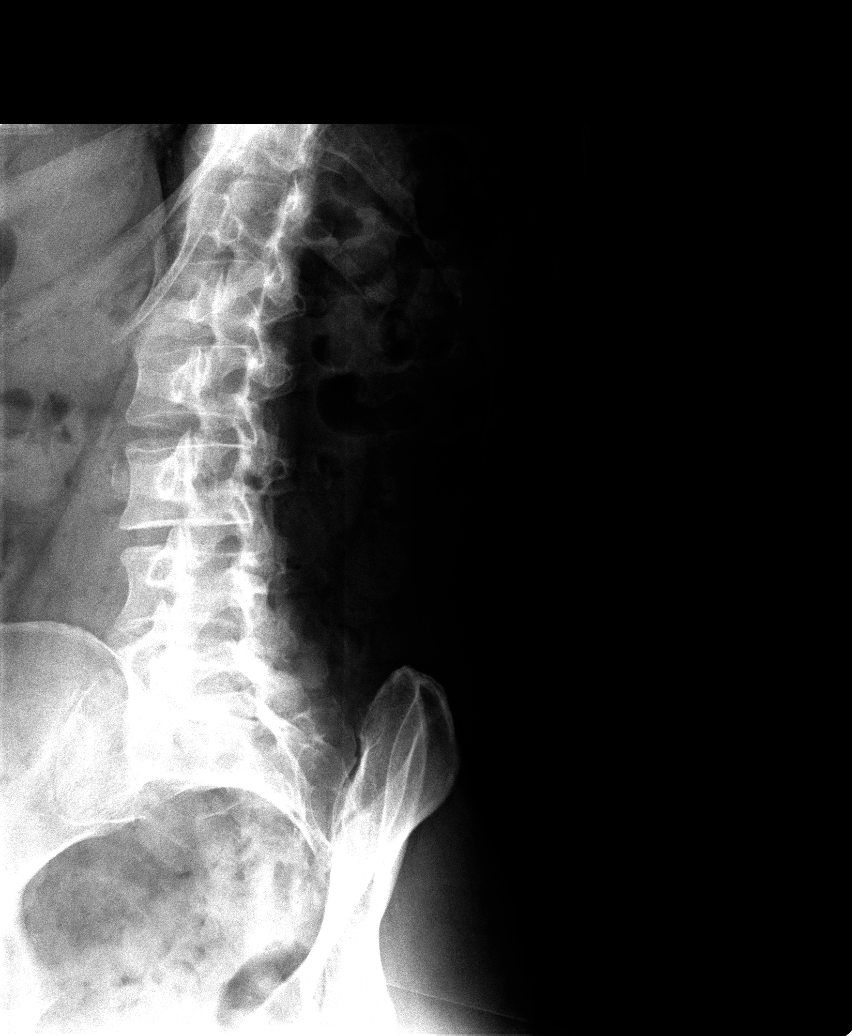

[view not recorded (3 of 5)]
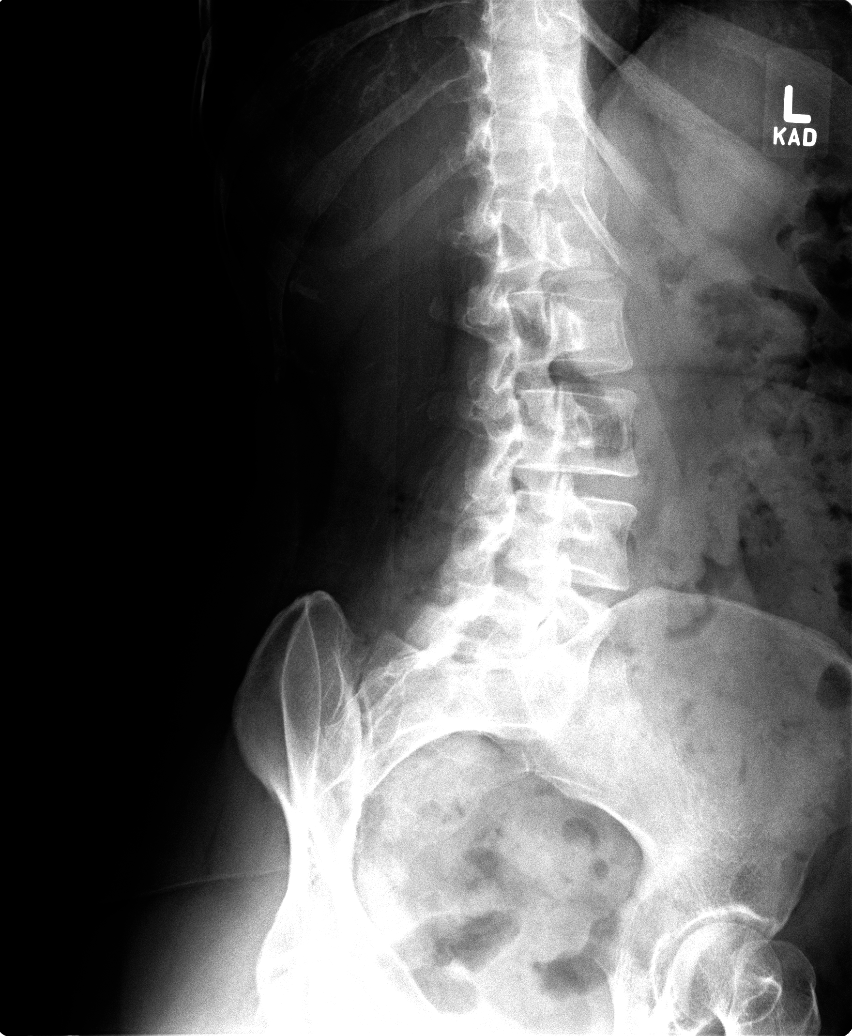

[view not recorded (4 of 5)]
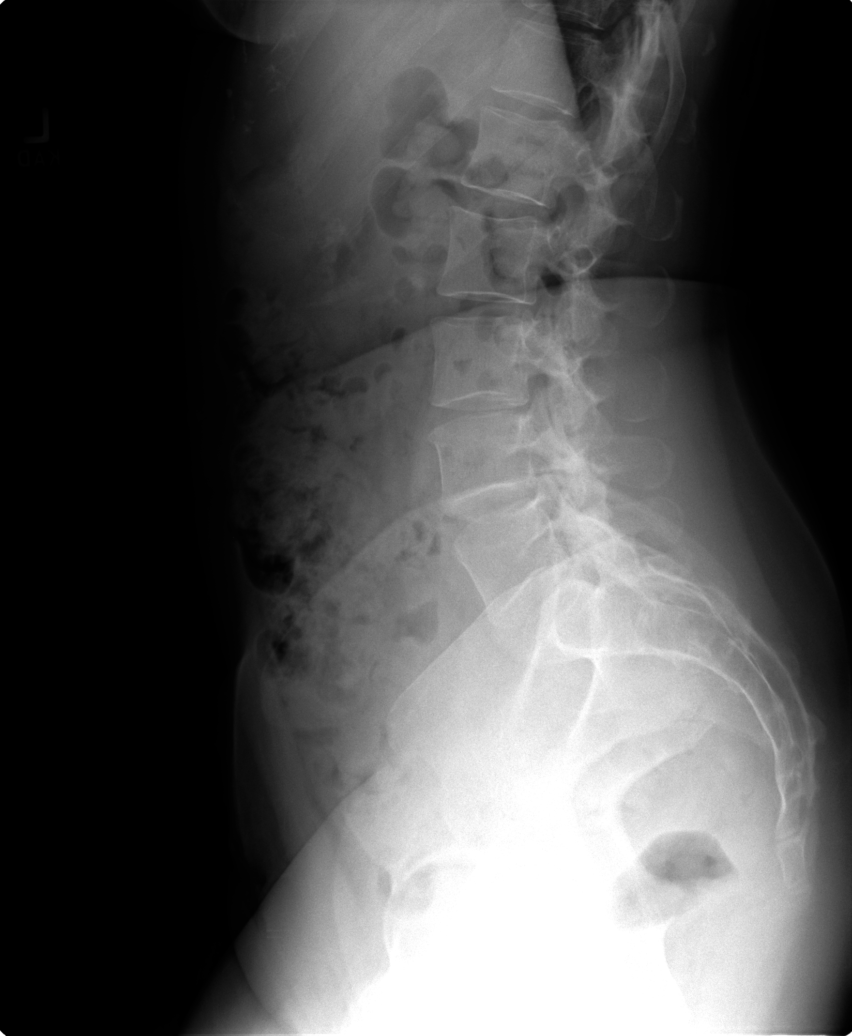

[view not recorded (5 of 5)]
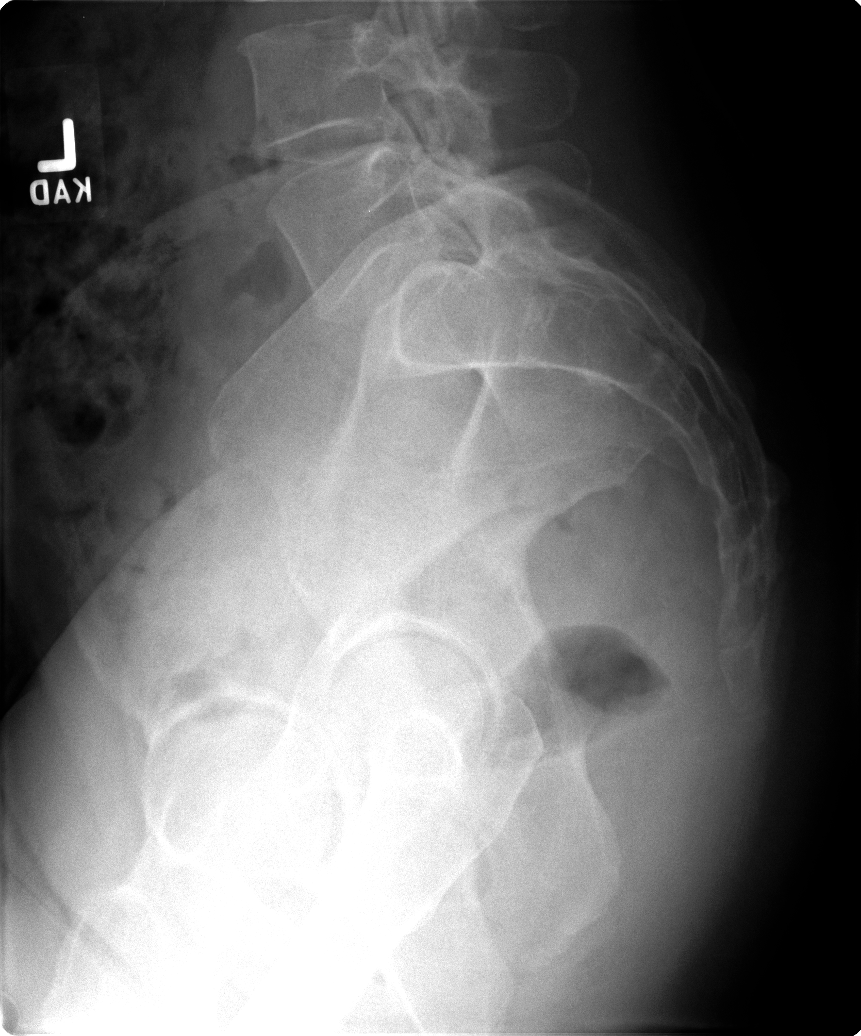

[5 of 5 positions shown; findings below may reference images not displayed]

FINDINGS: The lumbar vertebral bodies are preserved in height. The pedicles
and transverse processes are intact. The disc space heights are
reasonably well-maintained. There is no spondylolisthesis. There is
facet joint hypertrophy at L4-5 and at L5-S1. The observed portions
of the sacrum are normal.
IMPRESSION: There is mild degenerative facet joint change in the lower lumbar
spine. There is no compression fracture nor other acute bony
abnormality.

## 2017-01-25 DIAGNOSIS — E7800 Pure hypercholesterolemia, unspecified: Secondary | ICD-10-CM | POA: Diagnosis not present

## 2017-01-25 DIAGNOSIS — R079 Chest pain, unspecified: Secondary | ICD-10-CM | POA: Diagnosis not present

## 2017-01-25 DIAGNOSIS — M79602 Pain in left arm: Secondary | ICD-10-CM | POA: Diagnosis not present

## 2017-01-30 ENCOUNTER — Other Ambulatory Visit: Payer: Self-pay | Admitting: *Deleted

## 2017-01-30 ENCOUNTER — Telehealth: Payer: Self-pay | Admitting: Neurology

## 2017-01-30 ENCOUNTER — Other Ambulatory Visit: Payer: Self-pay | Admitting: Family Medicine

## 2017-01-30 MED ORDER — RIZATRIPTAN BENZOATE 5 MG PO TABS: 5.0000 mg | ORAL_TABLET | ORAL | 0 refills | Status: DC | PRN

## 2017-01-30 NOTE — Telephone Encounter (Signed)
Received refill electronically Last refill 12/30/16 #30 Last office visit 05/25/16

## 2017-01-30 NOTE — Telephone Encounter (Signed)
Px written for call in   

## 2017-01-30 NOTE — Telephone Encounter (Signed)
Rx sent with no refills and note stating that she needs an appointment.

## 2017-01-30 NOTE — Telephone Encounter (Signed)
Rx called to pharmacy. Please sign refill.

## 2017-01-30 NOTE — Telephone Encounter (Signed)
Received a call from PT regarding medication: Maxalt.  Patient needs a refill of medication. Yes  Patient having side effects from medication. No  Patient calling to update [place] on medication. No

## 2017-03-20 DIAGNOSIS — M79644 Pain in right finger(s): Secondary | ICD-10-CM | POA: Diagnosis not present

## 2017-03-23 ENCOUNTER — Other Ambulatory Visit: Payer: Self-pay | Admitting: Neurology

## 2017-04-10 ENCOUNTER — Encounter: Payer: Self-pay | Admitting: Family Medicine

## 2017-04-10 ENCOUNTER — Other Ambulatory Visit: Payer: Self-pay | Admitting: Neurology

## 2017-04-14 ENCOUNTER — Other Ambulatory Visit: Payer: Self-pay

## 2017-04-14 MED ORDER — RIZATRIPTAN BENZOATE 5 MG PO TABS: 5.0000 mg | ORAL_TABLET | ORAL | 0 refills | Status: DC | PRN

## 2017-04-14 MED ORDER — ONDANSETRON HCL 4 MG PO TABS: ORAL_TABLET | ORAL | 0 refills | Status: DC

## 2017-04-14 NOTE — Telephone Encounter (Signed)
It looks like neuro wouldn't refill until she made a follow up appt. Will give temporary supply. She needs to make a follow up appt with PCP or neuro for additional refills.

## 2017-04-14 NOTE — Telephone Encounter (Signed)
Pt left voice mail requesting refills. It looks like someone else from a different office has been refilling those meds. Please advise.

## 2017-04-14 NOTE — Telephone Encounter (Signed)
Unable to leave vm. It looks that she's been notified since April.

## 2017-05-17 ENCOUNTER — Other Ambulatory Visit: Payer: Self-pay

## 2017-05-17 MED ORDER — RIZATRIPTAN BENZOATE 5 MG PO TABS: 5.0000 mg | ORAL_TABLET | ORAL | 1 refills | Status: DC | PRN

## 2017-05-17 MED ORDER — ZOLPIDEM TARTRATE 10 MG PO TABS: 10.0000 mg | ORAL_TABLET | Freq: Every evening | ORAL | 3 refills | Status: DC | PRN

## 2017-05-17 NOTE — Telephone Encounter (Signed)
Pt left v/m requesting refill maxalt (last refilled # 10 on 04/14/17) and ambien (last refilled # 30 x 3 on 01/30/17). Pt last seen 05/25/16 and no future appt scheduled.[facility].

## 2017-05-17 NOTE — Telephone Encounter (Signed)
Px written for call in   Please schedule a sept f/u Thanks

## 2017-05-18 NOTE — Telephone Encounter (Signed)
appt scheduled and Rxs called in as prescribed

## 2017-05-30 ENCOUNTER — Ambulatory Visit: Payer: Commercial Managed Care - PPO | Admitting: Family Medicine

## 2017-05-30 DIAGNOSIS — Z0289 Encounter for other administrative examinations: Secondary | ICD-10-CM

## 2017-05-31 ENCOUNTER — Telehealth: Payer: Self-pay | Admitting: Family Medicine

## 2017-05-31 NOTE — Telephone Encounter (Signed)
Patient did not come in for their appointment on 08/14 for med follow up Please let me know if patient needs to be contacted immediately for follow up or no follow up needed. Do you want to charge the NSF?

## 2017-05-31 NOTE — Telephone Encounter (Signed)
Do charge the fee  No urgency for f/u -she can schedule when she is ready

## 2017-08-25 ENCOUNTER — Other Ambulatory Visit: Payer: Self-pay | Admitting: Neurology

## 2017-09-04 ENCOUNTER — Encounter: Payer: Self-pay | Admitting: Internal Medicine

## 2017-09-04 ENCOUNTER — Ambulatory Visit (INDEPENDENT_AMBULATORY_CARE_PROVIDER_SITE_OTHER): Payer: BLUE CROSS/BLUE SHIELD | Admitting: Internal Medicine

## 2017-09-04 DIAGNOSIS — K580 Irritable bowel syndrome with diarrhea: Secondary | ICD-10-CM

## 2017-09-04 DIAGNOSIS — F419 Anxiety disorder, unspecified: Secondary | ICD-10-CM | POA: Diagnosis not present

## 2017-09-04 DIAGNOSIS — M542 Cervicalgia: Secondary | ICD-10-CM

## 2017-09-04 DIAGNOSIS — G43C1 Periodic headache syndromes in child or adult, intractable: Secondary | ICD-10-CM | POA: Diagnosis not present

## 2017-09-04 DIAGNOSIS — G4700 Insomnia, unspecified: Secondary | ICD-10-CM

## 2017-09-04 DIAGNOSIS — K589 Irritable bowel syndrome without diarrhea: Secondary | ICD-10-CM | POA: Insufficient documentation

## 2017-09-04 MED ORDER — ZOLPIDEM TARTRATE 10 MG PO TABS: 10.0000 mg | ORAL_TABLET | Freq: Every evening | ORAL | 0 refills | Status: DC | PRN

## 2017-09-04 MED ORDER — BACLOFEN 10 MG PO TABS: ORAL_TABLET | ORAL | 0 refills | Status: DC

## 2017-09-04 MED ORDER — DOXEPIN HCL 25 MG PO CAPS: 25.0000 mg | ORAL_CAPSULE | Freq: Every day | ORAL | 0 refills | Status: DC

## 2017-09-04 MED ORDER — ONDANSETRON HCL 4 MG PO TABS: ORAL_TABLET | ORAL | 0 refills | Status: DC

## 2017-09-04 MED ORDER — DULOXETINE HCL 60 MG PO CPEP: 60.0000 mg | ORAL_CAPSULE | Freq: Every day | ORAL | 0 refills | Status: DC

## 2017-09-04 NOTE — Assessment & Plan Note (Signed)
Mainly diarrhea Improved with Cymbalta Discussed FODMAP diet Will monitor

## 2017-09-04 NOTE — Assessment & Plan Note (Signed)
Secondary to spinal stenosis Controlled with Cymbalta, Baclofen and Flurbiprofen Meds refilled today

## 2017-09-04 NOTE — Patient Instructions (Signed)

## 2017-09-04 NOTE — Assessment & Plan Note (Signed)
Secondary to chronic neck pain Controlled with Cymbalta, Baclofen, Flurbifprofen, Maxalt, Fioricet and Zofran Meds refilled today

## 2017-09-04 NOTE — Assessment & Plan Note (Signed)
Currently not an issue Will monitor 

## 2017-09-04 NOTE — Assessment & Plan Note (Signed)
Ambien not as effective, now having to take Doxepin as well Discussed the importance of a sleep routine Meds refilled today

## 2017-09-04 NOTE — Progress Notes (Signed)
Subjective:    Patient ID: Laurie [name], female    DOB: [birth year], 51 y.o.   MRN: [id number]  HPI  Pt presents to the clinic today to follow up chronic conditions and for med refill. She is a patient of Dr. Marliss Coots.   Anxiety: She reports this was situational and has resolved. She is not currently taking anything for anxiety at this time.  Chronic Neck Pain: Secondary to spinal stenosis. She has had multiple injections. She takes Flurbiprofen, Cymbalta and Baclofen as prescribed with decent relief. She would like a refill of Cymbalta, and Baclofen today.   IBS: Mainly diarrhea. She reports this improved significantly with the Cymbalta. She would like a refill of Cymbalta today.  Migraines: These occur several times per week.They are triggered by her chronic neck pain. She takes Fioricet, Maxalt and Zofran as needed with good relief.  She would like a refill of Zofran today.  Insomnia: She has trouble falling asleep and staying. She takes Ambien nightly with good relief. She reports her MD at work also started her Doxepin because the Ambien did not seem to be as effective as it once was. She would like a refill of the Ambien and Doxepin today as well.   Review of Systems      Past Medical History:  Diagnosis Date  . Anxiety    situational  . Chronic neck pain    from disc dz  . IBS (irritable bowel syndrome)    diarrhea predominant  . Insomnia    organic sleep disorder, failed multiple meds  . Migraine     Current Outpatient Medications  Medication Sig Dispense Refill  . baclofen (LIORESAL) 10 MG tablet TAKE ONE TABLET BY MOUTH AT BEDTIME AS NEEDED FOR MUSCLE SPASMS 20 tablet 0  . butalbital-acetaminophen-caffeine (FIORICET, ESGIC) 50-325-40 MG tablet TAKE ONE TABLET BY MOUTH TWICE DAILY AS NEEDED FOR HEADACHE OR  MIGRAINE 20 tablet 0  . DULoxetine (CYMBALTA) 60 MG capsule Take 1 capsule (60 mg total) by mouth daily. 30 capsule 11  . flurbiprofen (ANSAID) 50 MG tablet Take  1 tablet (50 mg total) by mouth 2 (two) times daily as needed. 20 tablet 3  . ondansetron (ZOFRAN) 4 MG tablet TAKE ONE TABLET BY MOUTH EVERY 8 HOURS AS NEEDED FOR NAUSEA OR  VOMITING 20 tablet 0  . rizatriptan (MAXALT) 5 MG tablet Take 1 tablet (5 mg total) by mouth as needed for migraine. May repeat in 2 hours if needed 10 tablet 1  . zolpidem (AMBIEN) 10 MG tablet Take 1 tablet (10 mg total) by mouth at bedtime as needed. 30 tablet 3   No current facility-administered medications for this visit.     Allergies  Allergen Reactions  . Codeine     REACTION: vomiting  . Codeine Phosphate     REACTION: vomiting  . Penicillins     REACTION: urticaria (hives)    Family History  Problem Relation Age of Onset  . Heart disease Other        CAD  . Heart disease Other        massive MI  . COPD Father        Deceased, 7  . Heart disease Father        CAD  . Migraines Father   . Migraines Brother   . Thyroid disease Mother        Living, 43  . Migraines Daughter     Social History   Socioeconomic History  .  Marital status: Divorced    Spouse name: Not on file  . Number of children: Not on file  . Years of education: Not on file  . Highest education level: Not on file  Social Needs  . Financial resource strain: Not on file  . Food insecurity - worry: Not on file  . Food insecurity - inability: Not on file  . Transportation needs - medical: Not on file  . Transportation needs - non-medical: Not on file  Occupational History  . Not on file  Tobacco Use  . Smoking status: Never Smoker  . Smokeless tobacco: Never Used  Substance and Sexual Activity  . Alcohol use: Yes    Alcohol/week: 0.0 oz    Comment: occ/rare  . Drug use: No  . Sexual activity: Not on file  Other Topics Concern  . 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   She lives alone.     Highest level of education:  Some college     Constitutional: Pt  reports frequent headaches. Denies fever, malaise, fatigue, or abrupt weight changes.  HEENT: Denies eye pain, eye redness, ear pain, ringing in the ears, wax buildup, runny nose, nasal congestion, bloody nose, or sore throat. Respiratory: Denies difficulty breathing, shortness of breath, cough or sputum production.   Cardiovascular: Denies chest pain, chest tightness, palpitations or swelling in the hands or feet.  Gastrointestinal: Denies abdominal pain, bloating, constipation, diarrhea or blood in the stool.  GU: Denies urgency, frequency, pain with urination, burning sensation, blood in urine, odor or discharge. Musculoskeletal: Pt reports chronic neck pain. Denies decrease in range of motion, difficulty with gait, or joint swelling.  Skin: Denies redness, rashes, lesions or ulcercations.  Neurological: Pt reports insomnia. Denies dizziness, difficulty with memory, difficulty with speech or problems with balance and coordination.  Psych: Denies anxiety, depression, SI/HI.  No other specific complaints in a complete review of systems (except as listed in HPI above).  Objective:   Physical Exam  BP 122/82   Pulse 83   Temp 98.3 F (36.8 C) (Oral)   Wt 148 lb (67.1 kg)   SpO2 98%   BMI 26.22 kg/m  Wt Readings from Last 3 Encounters:  09/04/17 148 lb (67.1 kg)  05/25/16 124 lb 12 oz (56.6 kg)  01/13/16 126 lb 7 oz (57.4 kg)    General: Appears her stated age, well developed, well nourished in NAD. Abdomen: Soft and nontender. Normal bowel sounds.  Musculoskeletal: Normal extension and rotation of the cervical spine. Bony tenderness noted over the cervical spine. Tension noted of the paracervical muscles.   Neurological: Alert and oriented.  Psychiatric: Mood and affect normal. Behavior is normal. Judgment and thought content normal.     BMET    Component Value Date/Time   NA 140 10/26/2015 1515   K 3.3 (L) 10/26/2015 1515   CL 102 10/26/2015 1515   CO2 32 10/26/2015 1515     GLUCOSE 97 10/26/2015 1515   BUN 14 10/26/2015 1515   CREATININE 0.69 10/26/2015 1515   CALCIUM 9.2 10/26/2015 1515    Lipid Panel  No results found for: CHOL, TRIG, HDL, CHOLHDL, VLDL, LDLCALC  CBC    Component Value Date/Time   WBC 5.7 10/26/2015 1515   RBC 4.49 10/26/2015 1515   HGB 13.5 10/26/2015 1515   HCT 40.4 10/26/2015 1515   PLT 281.0 10/26/2015 1515   MCV 90.2 10/26/2015 1515   MCHC 33.4  10/26/2015 1515   RDW 12.5 10/26/2015 1515   LYMPHSABS 2.2 10/26/2015 1515   MONOABS 0.4 10/26/2015 1515   EOSABS 0.1 10/26/2015 1515   BASOSABS 0.0 10/26/2015 1515    Hgb A1C No results found for: HGBA1C          Assessment & Plan:

## 2017-10-03 ENCOUNTER — Encounter: Payer: Self-pay | Admitting: Family Medicine

## 2017-10-03 ENCOUNTER — Other Ambulatory Visit: Payer: Self-pay | Admitting: Family Medicine

## 2017-10-03 MED ORDER — DOXEPIN HCL 25 MG PO CAPS: 25.0000 mg | ORAL_CAPSULE | Freq: Every day | ORAL | 5 refills | Status: DC

## 2017-10-03 MED ORDER — ZOLPIDEM TARTRATE 10 MG PO TABS: 10.0000 mg | ORAL_TABLET | Freq: Every evening | ORAL | 1 refills | Status: DC | PRN

## 2017-10-03 MED ORDER — DULOXETINE HCL 60 MG PO CPEP: 60.0000 mg | ORAL_CAPSULE | Freq: Every day | ORAL | 5 refills | Status: DC

## 2017-10-03 NOTE — Telephone Encounter (Signed)
Pt requesting refill ambien,cymbalta and Sinequan each med refilled # 30 on 09/04/17. Pt last seen 09/04/17. [facility]. Pt requesting refill today.

## 2017-10-03 NOTE — Telephone Encounter (Signed)
Pt requesting refill of Ambien(last filled on 09/04/17), Cymbalta and Sinequan. Last OV on 09/04/17.

## 2017-10-03 NOTE — Telephone Encounter (Signed)
Px written for call in   

## 2017-10-03 NOTE — Telephone Encounter (Signed)
Copied from [place] [id number]. Topic: Inquiry >> Oct 03, 2017  2:34 PM Pricilla Handler wrote: Reason for CRM: Patient called requesting refills for the following three medications: Zolpidem (AMBIEN) 10 MG tablet, DULoxetine (CYMBALTA) 60 MG capsule, and Doxepin (SINEQUAN) 25 MG capsule. Patient has requested that these medications be refilled today. Patient wants the refills to be sent to [facility], [address] [place], [place] [phone].       Thank You!!!

## 2017-10-03 NOTE — Telephone Encounter (Signed)
Medication phoned to pharmacy.  

## 2017-11-27 ENCOUNTER — Telehealth: Payer: Self-pay | Admitting: Family Medicine

## 2017-11-27 NOTE — Telephone Encounter (Signed)
Spoke to pt. She is asking for a  refill of baclofen. She has been working out. Also asking for a refill of fioricet. Call in to [facility]. [place] [employer].

## 2017-11-27 NOTE — Telephone Encounter (Signed)
It looks like this is too early to request  Let me know if I'm wrong

## 2017-11-27 NOTE — Telephone Encounter (Signed)
Electronic refill request Lat refill 10/03/17 #30/1 Last office visit 09/04/17/acute

## 2017-11-27 NOTE — Telephone Encounter (Signed)
I agree. Rx says last filled 10-31-17. Please advise earliest fill date

## 2017-11-27 NOTE — Telephone Encounter (Signed)
I will write do not fill before 2/13 -please let her know I and call in

## 2017-11-28 ENCOUNTER — Telehealth: Payer: Self-pay

## 2017-11-28 MED ORDER — BACLOFEN 10 MG PO TABS: ORAL_TABLET | ORAL | 1 refills | Status: DC

## 2017-11-28 MED ORDER — BUTALBITAL-APAP-CAFFEINE 50-325-40 MG PO TABS: ORAL_TABLET | ORAL | 0 refills | Status: DC

## 2017-11-28 NOTE — Telephone Encounter (Signed)
I sent them  I assume that means she is no longer seeing neurology for her headaches

## 2017-11-28 NOTE — Telephone Encounter (Signed)
Laurie [name] at [facility] has Laurie [name] on phone and Laurie [name] is requesting refill for baclofen ( last refilled # 20 on 09/21/16 by Narda Amber DO) and fioricet (last refilled # 20 on 10/05/16 by Narda Amber DO.) Laurie [name] last saw Dr Glori Bickers 05/25/16 but did see Avie Echevaria NP on 09/04/17 for med refills. Laurie [name] has advised Laurie [name] she would need to schedule appt but Laurie [name] wants refills requests sent to Dr Glori Bickers; Laurie [name] does not think she would need to be seen. No future appts scheduled. [facility] [address] [place].

## 2017-11-28 NOTE — Telephone Encounter (Signed)
I think both of those medicines were from neurology Dr Posey Pronto- is she still seeing?  Please clarify

## 2017-11-28 NOTE — Telephone Encounter (Signed)
Pt said you had prescribed it at 1st then you sent her to Dr. Posey Pronto who got her on the correct dose and strength. After that pt had been seeing a provider that was at the [place] office that was a part of her old job and they were prescribing med but pt doesn't work there anymore and go back to see Dr. 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 Glori Bickers to take over filling these meds

## 2017-11-29 ENCOUNTER — Other Ambulatory Visit: Payer: Self-pay | Admitting: Family Medicine

## 2017-11-29 NOTE — Telephone Encounter (Signed)
No problem I think I refilled when she needed

## 2017-12-27 ENCOUNTER — Other Ambulatory Visit: Payer: Self-pay | Admitting: Family Medicine

## 2017-12-27 NOTE — Telephone Encounter (Signed)
Copied from [place] [id number]. Topic: Quick Communication - Rx Refill/Question >> Dec 27, 2017  2:40 PM Percell Belt A wrote: Medication: zolpidem (AMBIEN) 10 MG tablet [[id number]]    Has the patient contacted their pharmacy? No    (Agent: If no, request that the patient contact the pharmacy for the refill.)   Preferred Pharmacy (with phone number or street name): [facility] [address] [facility] [place]: Please be advised that RX refills may take up to 3 business days. We ask that you follow-up with your pharmacy.

## 2017-12-28 MED ORDER — ZOLPIDEM TARTRATE 10 MG PO TABS: 10.0000 mg | ORAL_TABLET | Freq: Every evening | ORAL | 0 refills | Status: DC | PRN

## 2017-12-28 NOTE — Telephone Encounter (Signed)
Refill request for Laurie [name]  Last refill 11/27/17  Provider  Loura Pardon, MD  [place]  09/04/17  Pharmacy:  [facility] [address]  in [facility]

## 2017-12-28 NOTE — Telephone Encounter (Signed)
Addressed in another refill request

## 2017-12-28 NOTE — Telephone Encounter (Signed)
I refilled it  Please schedule a spring f/u or PE

## 2017-12-29 NOTE — Telephone Encounter (Signed)
Called pt and she said she called in Nov for a med refill appt and Dr. Glori Bickers wasn't available so they schedule her an appt with Webb Silversmith, NP but it was a med refill appt. Pt ? If she needs to still see Dr. Glori Bickers so soon since that's what the Nov appt was for, please advise

## 2017-12-29 NOTE — Telephone Encounter (Signed)
Unless she wants a physical (health mt appt) that is fine (make an appt in the fall) Looks like she had not had labs in a while/unsure if she sees gyn etc

## 2018-01-02 NOTE — Telephone Encounter (Signed)
Pt will schedule f/u with Dr. Glori Bickers in Nov, pt declined to schedule an appt now due to not knowing her schedule that far ahead but she will call back and schedule an appt closer to Nov

## 2018-01-25 ENCOUNTER — Other Ambulatory Visit: Payer: Self-pay | Admitting: Family Medicine

## 2018-01-25 NOTE — Telephone Encounter (Signed)
Med refill appt was done on 09/04/17 with Webb Silversmith, NP (Dr. Glori Bickers was out), last filled on 12/28/17 #30 tabs with 0 refills

## 2018-02-07 ENCOUNTER — Other Ambulatory Visit: Payer: Self-pay | Admitting: Family Medicine

## 2018-02-07 MED ORDER — BUTALBITAL-APAP-CAFFEINE 50-325-40 MG PO TABS: ORAL_TABLET | ORAL | 0 refills | Status: DC

## 2018-02-07 NOTE — Telephone Encounter (Signed)
Will refill electronically  

## 2018-02-07 NOTE — Telephone Encounter (Signed)
Copied from [place] [id number]. Topic: Quick Communication - See Telephone Encounter >> Feb 07, 2018 10:48 AM Ether Griffins B wrote: CRM for notification. See Telephone encounter for: 02/07/18.  Pt is requesting Fiorcet be called in for her to the [place], [place] [address]. She has been having bad tension headaches caused by stress at work and she cant get rid of the headaches and is hoping this can be called in for her.

## 2018-02-19 ENCOUNTER — Encounter: Payer: Self-pay | Admitting: Internal Medicine

## 2018-02-19 ENCOUNTER — Other Ambulatory Visit: Payer: Self-pay | Admitting: Internal Medicine

## 2018-02-19 MED ORDER — ONDANSETRON HCL 4 MG PO TABS: ORAL_TABLET | ORAL | 1 refills | Status: DC

## 2018-02-23 ENCOUNTER — Other Ambulatory Visit: Payer: Self-pay | Admitting: Family Medicine

## 2018-02-23 MED ORDER — ZOLPIDEM TARTRATE 10 MG PO TABS: 10.0000 mg | ORAL_TABLET | Freq: Every evening | ORAL | 3 refills | Status: DC | PRN

## 2018-02-23 MED ORDER — BUTALBITAL-APAP-CAFFEINE 50-325-40 MG PO TABS: ORAL_TABLET | ORAL | 0 refills | Status: DC

## 2018-02-23 NOTE — Telephone Encounter (Signed)
Copied from [place] [id number]. Topic: Quick Communication - Rx Refill/Question >> Feb 23, 2018 10:35 AM Aurelio Brash B wrote: Medication: zolpidem (AMBIEN) 10 MG tablet flurbiprofen (ANSAID) 50 MG tablet  Has the patient contacted their pharmacy?no  (Agent: If no, request that the patient contact the pharmacy for the refill.)  Preferred Pharmacy (with phone number or street name): [id number] - [place], [place] AT [address] [phone] (Phone) [phone] (Fax)       Agent: Please be advised that RX refills may take up to 3 business days. We ask that you follow-up with your pharmacy.

## 2018-02-23 NOTE — Telephone Encounter (Signed)
Last OV was 09/04/17, fioricet last filled on 02/07/18 #20 tabs with 0 refills, ambien last filled on 01/25/18 #30 tabs with 0 refill

## 2018-02-23 NOTE — Telephone Encounter (Signed)
Refill request for Ambien, last filled on 01/25/18 #30 and Flurbiprofen(Ansaid) 50 mg last filled on 01/13/16 by a different provider.   LOV: 09/04/17 with Jacquelynn Cree PCP: Dr. Charline Bills  [address]    [place]

## 2018-02-23 NOTE — Telephone Encounter (Signed)
Addressed through separate refill request  

## 2018-03-06 ENCOUNTER — Ambulatory Visit: Payer: BLUE CROSS/BLUE SHIELD | Admitting: Family Medicine

## 2018-03-06 ENCOUNTER — Encounter: Payer: Self-pay | Admitting: Family Medicine

## 2018-03-06 VITALS — BP 130/80 | HR 81 | Temp 98.2°F | Ht 63.0 in | Wt 155.0 lb

## 2018-03-06 DIAGNOSIS — G43C1 Periodic headache syndromes in child or adult, intractable: Secondary | ICD-10-CM

## 2018-03-06 DIAGNOSIS — F419 Anxiety disorder, unspecified: Secondary | ICD-10-CM | POA: Diagnosis not present

## 2018-03-06 DIAGNOSIS — R41840 Attention and concentration deficit: Secondary | ICD-10-CM

## 2018-03-06 HISTORY — DX: Attention and concentration deficit: R41.840

## 2018-03-06 MED ORDER — BUSPIRONE HCL 15 MG PO TABS: 7.5000 mg | ORAL_TABLET | Freq: Two times a day (BID) | ORAL | 5 refills | Status: DC

## 2018-03-06 MED ORDER — RIZATRIPTAN BENZOATE 5 MG PO TABS: 5.0000 mg | ORAL_TABLET | ORAL | 5 refills | Status: DC | PRN

## 2018-03-06 NOTE — Progress Notes (Signed)
Subjective:    Patient ID: Laurie [name], female    DOB: [birth year], 52 y.o.   MRN: [id number]  HPI Here for f/u of chronic medical problems   Also wt loss   Also migraine   Wt Readings from Last 3 Encounters:  03/06/18 155 lb (70.3 kg)  09/04/17 148 lb (67.1 kg)  05/25/16 124 lb 12 oz (56.6 kg)   27.46 kg/m  Gained 40 lb in the past 2 y    Headaches are worsened by chronic neck pain =past tx with  cymbalta Baclofen flurifprofen maxalt fioricet zofran  Has seen neuro in the past   Has "some kind of headache" every day  Takes maxalt - 0-4 per week depends on the week  fioricet - perhaps 4 per week  Baclofen- if it comes from her neck  nsaid-"tears up her stomach" and makes ibs worse since stopping her anti anx meds    Started a new job in [employer]  Now a Freight forwarder  More responsibility = a less structured and organized position  Thinks she has had ADD her whole life - never treated/ she gets overwhelmed easily with inability to concentrate  Has to work at home 2 days per week- very hard to sit down and do her job (gets up and goes to the kitchen to eat)  Self medicating with   Poor grades in school - with a lot of work  Brother had ADD and were treated    Cut the cymbalta and doxepin - ? Causing wt gain or focus problems  cymbalta was hard to come off of  Did not help   Now she is going to the gym/putting limits on her eating and taking exercise classes    Patient Active Problem List   Diagnosis Date Noted  . Poor concentration 03/06/2018  . IBS (irritable bowel syndrome) 09/04/2017  . Adjustment disorder with mixed anxiety and depressed mood 05/25/2016  . Breast lump in female 03/04/2015  . Pelvic pain in female 03/04/2015  . Frequent urination 03/04/2015  . Low back pain 03/04/2015  . Vitamin D deficiency 03/04/2015  . Chronic daily headache 02/19/2014  . Insomnia 06/06/2007  . [place] DISEASE 06/05/2007  . Anxious mood 04/04/2007  .  Migraine headache 04/04/2007  . NECK PAIN, CHRONIC 04/04/2007   Past Medical History:  Diagnosis Date  . Anxiety    situational  . Chronic neck pain    from disc dz  . IBS (irritable bowel syndrome)    diarrhea predominant  . Insomnia    organic sleep disorder, failed multiple meds  . Migraine    Past Surgical History:  Procedure Laterality Date  . ABDOMINAL HYSTERECTOMY    . BREAST CYST ASPIRATION Right   . Deg C-S disk/spinal stenosis  06/2007  . epidural injections    . KNEE SURGERY     Social History   Tobacco Use  . Smoking status: Never Smoker  . Smokeless tobacco: Never Used  Substance Use Topics  . Alcohol use: Yes    Alcohol/week: 0.0 oz    Comment: occ/rare  . Drug use: No   Family History  Problem Relation Age of Onset  . Heart disease Other        CAD  . Heart disease Other        massive MI  . COPD Father        Deceased, 75  . Heart disease Father        CAD  .  Migraines Father   . Migraines Brother   . Thyroid disease Mother        Living, 58  . Migraines Daughter    Allergies  Allergen Reactions  . Codeine     REACTION: vomiting  . Codeine Phosphate     REACTION: vomiting  . Penicillins     REACTION: urticaria (hives)   Current Outpatient Medications on File Prior to Visit  Medication Sig Dispense Refill  . baclofen (LIORESAL) 10 MG tablet TAKE ONE TABLET BY MOUTH AT BEDTIME AS NEEDED FOR MUSCLE SPASMS 20 tablet 1  . butalbital-acetaminophen-caffeine (FIORICET, ESGIC) 50-325-40 MG tablet TAKE ONE TABLET BY MOUTH TWICE DAILY AS NEEDED FOR HEADACHE OR  MIGRAINE 20 tablet 0  . ondansetron (ZOFRAN) 4 MG tablet TAKE ONE TABLET BY MOUTH EVERY 8 HOURS AS NEEDED FOR NAUSEA OR  VOMITING 20 tablet 1  . zolpidem (AMBIEN) 10 MG tablet Take 1 tablet (10 mg total) by mouth at bedtime as needed. 30 tablet 3   No current facility-administered medications on file prior to visit.     Review of Systems  Constitutional: Positive for fatigue. Negative  for activity change, appetite change, fever and unexpected weight change.  HENT: Negative for congestion, ear pain, rhinorrhea, sinus pressure and sore throat.   Eyes: Negative for pain, redness and visual disturbance.  Respiratory: Negative for cough, shortness of breath and wheezing.   Cardiovascular: Negative for chest pain and palpitations.  Gastrointestinal: Negative for abdominal pain, blood in stool, constipation and diarrhea.  Endocrine: Negative for polydipsia and polyuria.  Genitourinary: Negative for dysuria, frequency and urgency.  Musculoskeletal: Negative for arthralgias, back pain and myalgias.  Skin: Negative for pallor and rash.  Allergic/Immunologic: Negative for environmental allergies.  Neurological: Positive for headaches. Negative for dizziness, syncope, speech difficulty, light-headedness and numbness.  Hematological: Negative for adenopathy. Does not bruise/bleed easily.  Psychiatric/Behavioral: Positive for decreased concentration and dysphoric mood. Negative for suicidal ideas. The patient is nervous/anxious.        Objective:   Physical Exam  Constitutional: She is oriented to person, place, and time. She appears well-developed and well-nourished. No distress.  HENT:  Head: Normocephalic and atraumatic.  Right Ear: External ear normal.  Left Ear: External ear normal.  Nose: Nose normal.  Mouth/Throat: Oropharynx is clear and moist. No oropharyngeal exudate.  No sinus tenderness No temporal tenderness  No TMJ tenderness  Eyes: Pupils are equal, round, and reactive to light. Conjunctivae and EOM are normal. Right eye exhibits no discharge. Left eye exhibits no discharge. No scleral icterus.  No nystagmus  Neck: Normal range of motion and full passive range of motion without pain. Neck supple. No JVD present. Carotid bruit is not present. No tracheal deviation present. No thyromegaly present.  Cardiovascular: Normal rate, regular rhythm and normal heart sounds.   No murmur heard. Pulmonary/Chest: Effort normal and breath sounds normal. No respiratory distress. She has no wheezes. She has no rales.  Abdominal: Soft. Bowel sounds are normal. She exhibits no distension and no mass. There is no tenderness.  Musculoskeletal: She exhibits no edema or tenderness.  Lymphadenopathy:    She has no cervical adenopathy.  Neurological: She is alert and oriented to person, place, and time. She has normal strength and normal reflexes. She displays no atrophy and no tremor. No cranial nerve deficit or sensory deficit. She exhibits normal muscle tone. She displays a negative Romberg sign. Coordination and gait normal.  No focal cerebellar signs   Skin: Skin is warm  and dry. No rash noted. No pallor.  Psychiatric: Her speech is normal and behavior is normal. Thought content normal. Her mood appears anxious. Her affect is not blunt, not labile and not inappropriate. She exhibits a depressed mood.  Speaks candidly about stressors           Assessment & Plan:   Problem List Items Addressed This Visit      Cardiovascular and Mediastinum   Migraine headache    Worse lately with stressors/anxiety and inability to concentrate Plans to get tested for ADD Also off her cymbalta and doxepin - will try buspar for anxiety  Refill needed rescue meds Disc self care/headache lifestyle habits  F/u after testing       Relevant Medications   rizatriptan (MAXALT) 5 MG tablet     Other   Anxious mood    Worse lately-work stressors and symptoms of ADD Will ref to counseling for ADD testing  Trial of buspar 7.5 bid for anxiety  Discussed expectations of SSRI medication including time to effectiveness and mechanism of action, also poss of side effects (early and late)- including mental fuzziness, weight or appetite change, nausea and poss of worse dep or anxiety (even suicidal thoughts)  Pt voiced understanding and will stop med and update if this occurs   >25 minutes spent  in face to face time with patient, >50% spent in counselling or coordination of care Including disc of organization for concentration and exercise  F/u planned  Self care discussed       Relevant Medications   busPIRone (BUSPAR) 15 MG tablet   Other Relevant Orders   Ambulatory referral to Psychology   Poor concentration - Primary    Suspect ADD= from childhood  Diff concentrating and completing tasks  Also some anxiety  Ref for ADD testing- interested in stimulant tx if pos  Also tx anx with buspar       Relevant Orders   Ambulatory referral to Psychology

## 2018-03-06 NOTE — Patient Instructions (Signed)
Take care of yourself   Try the buspar for anxiety   We will refer you for ADD testing   Drink lots of water  Try to eat healthy foods

## 2018-03-07 NOTE — Assessment & Plan Note (Signed)
Worse lately with stressors/anxiety and inability to concentrate Plans to get tested for ADD Also off her cymbalta and doxepin - will try buspar for anxiety  Refill needed rescue meds Disc self care/headache lifestyle habits  F/u after testing

## 2018-03-07 NOTE — Assessment & Plan Note (Signed)
Suspect ADD= from childhood  Diff concentrating and completing tasks  Also some anxiety  Ref for ADD testing- interested in stimulant tx if pos  Also tx anx with buspar

## 2018-03-07 NOTE — Assessment & Plan Note (Signed)
Worse lately-work stressors and symptoms of ADD Will ref to counseling for ADD testing  Trial of buspar 7.5 bid for anxiety  Discussed expectations of SSRI medication including time to effectiveness and mechanism of action, also poss of side effects (early and late)- including mental fuzziness, weight or appetite change, nausea and poss of worse dep or anxiety (even suicidal thoughts)  Pt voiced understanding and will stop med and update if this occurs   >25 minutes spent in face to face time with patient, >50% spent in counselling or coordination of care Including disc of organization for concentration and exercise  F/u planned  Self care discussed

## 2018-03-08 ENCOUNTER — Encounter: Payer: Self-pay | Admitting: Family Medicine

## 2018-03-13 ENCOUNTER — Other Ambulatory Visit: Payer: Self-pay | Admitting: Family Medicine

## 2018-03-13 MED ORDER — BUTALBITAL-APAP-CAFFEINE 50-325-40 MG PO TABS: ORAL_TABLET | ORAL | 1 refills | Status: DC

## 2018-03-13 NOTE — Telephone Encounter (Signed)
Last filled on 02/23/18 #20 tabs with 0 refills

## 2018-03-13 NOTE — Telephone Encounter (Signed)
Laurie [name]- since the wait is so long to get her in for ADD testing with Laurie [name]- is there anywhere else she could go that could see her sooner?  Outside Laurie [name] is ok with me Let me know, thanks

## 2018-03-16 ENCOUNTER — Ambulatory Visit: Payer: BLUE CROSS/BLUE SHIELD | Admitting: Psychology

## 2018-03-16 DIAGNOSIS — F5081 Binge eating disorder: Secondary | ICD-10-CM | POA: Diagnosis not present

## 2018-03-16 DIAGNOSIS — F902 Attention-deficit hyperactivity disorder, combined type: Secondary | ICD-10-CM

## 2018-03-23 ENCOUNTER — Ambulatory Visit: Payer: BLUE CROSS/BLUE SHIELD | Admitting: Family Medicine

## 2018-03-26 ENCOUNTER — Ambulatory Visit: Payer: BLUE CROSS/BLUE SHIELD | Admitting: Family Medicine

## 2018-04-09 ENCOUNTER — Ambulatory Visit (INDEPENDENT_AMBULATORY_CARE_PROVIDER_SITE_OTHER): Payer: BLUE CROSS/BLUE SHIELD | Admitting: Psychology

## 2018-04-09 DIAGNOSIS — F5081 Binge eating disorder: Secondary | ICD-10-CM | POA: Diagnosis not present

## 2018-04-09 DIAGNOSIS — F325 Major depressive disorder, single episode, in full remission: Secondary | ICD-10-CM

## 2018-04-09 DIAGNOSIS — F902 Attention-deficit hyperactivity disorder, combined type: Secondary | ICD-10-CM

## 2018-04-13 ENCOUNTER — Ambulatory Visit: Payer: BLUE CROSS/BLUE SHIELD | Admitting: Psychology

## 2018-04-17 ENCOUNTER — Ambulatory Visit: Payer: BLUE CROSS/BLUE SHIELD | Admitting: Family Medicine

## 2018-04-17 ENCOUNTER — Encounter: Payer: Self-pay | Admitting: Family Medicine

## 2018-04-17 VITALS — BP 122/74 | HR 78 | Temp 98.3°F | Ht 63.0 in | Wt 156.0 lb

## 2018-04-17 DIAGNOSIS — N951 Menopausal and female climacteric states: Secondary | ICD-10-CM

## 2018-04-17 DIAGNOSIS — F419 Anxiety disorder, unspecified: Secondary | ICD-10-CM

## 2018-04-17 DIAGNOSIS — M7710 Lateral epicondylitis, unspecified elbow: Secondary | ICD-10-CM

## 2018-04-17 DIAGNOSIS — F902 Attention-deficit hyperactivity disorder, combined type: Secondary | ICD-10-CM

## 2018-04-17 DIAGNOSIS — M7711 Lateral epicondylitis, right elbow: Secondary | ICD-10-CM | POA: Diagnosis not present

## 2018-04-17 HISTORY — DX: Lateral epicondylitis, unspecified elbow: M77.10

## 2018-04-17 HISTORY — DX: Menopausal and female climacteric states: N95.1

## 2018-04-17 HISTORY — DX: Attention-deficit hyperactivity disorder, combined type: F90.2

## 2018-04-17 MED ORDER — MELOXICAM 7.5 MG PO TABS: 7.5000 mg | ORAL_TABLET | Freq: Every day | ORAL | 3 refills | Status: DC | PRN

## 2018-04-17 MED ORDER — LISDEXAMFETAMINE DIMESYLATE 30 MG PO CAPS: 30.0000 mg | ORAL_CAPSULE | Freq: Every day | ORAL | 0 refills | Status: DC

## 2018-04-17 NOTE — Assessment & Plan Note (Signed)
Rev report from Irven Shelling  - testing pos for combined type Disc non pharm interventions recommended  Disc pharm options- pt wants to try vyvanse  Disc poss side eff incl habit/ sleep issues/headache/ gi upset and appetite change  Disc UDS / controlled med contract and protocol for this Given px for the 30 mg pill -one daily  Alert if side eff Update in 3 weeks re: if she needs higher dose or change  Continue to follow Continue also to work on anxiety and menopause symptoms  >25 minutes spent in face to face time with patient, >50% spent in counselling or coordination of care

## 2018-04-17 NOTE — Progress Notes (Signed)
Subjective:    Patient ID: Laurie [name], female    DOB: [birth year], 52 y.o.   MRN: [id number]  HPI Here to discuss testing positive for ADHD  Feeling fair  Is tired- came off the cymbalta (hurts all over)  It did sedate her and inc her appetite when she was on it and it did not help her headaches and it made her feel depressed  Her stomach - IBS is worse as well   May be going through menopause also  Has hot flashes / very hot    Had testing with Irven Shelling on 6/19 for attention/concentration problems  She met the criteria for ADHD with combined presentation-moderate in severity  Recommendation for f/u to discuss medication was made  Resource info given   Strategies for tx incl  Medication CBT mindulness Delaying distractability/ to do lists and time management/ schedules as well as visual reminders/alerts and minimization of distractions  Exercise/good self care and sleep also enc   Trying more exercise Boxing and kick boxing    Did hurt her elbow  She has held off this activity since then    Also buspar px for anx at last visit  Has tried it a few times when she felt overwhelmed- knocked the edge off of it   Has never tried a stimulant before  Wants to try vyvanse   Patient Active Problem List   Diagnosis Date Noted  . ADHD (attention deficit hyperactivity disorder), combined type 04/17/2018  . Lateral epicondylitis 04/17/2018  . Menopause syndrome 04/17/2018  . Poor concentration 03/06/2018  . IBS (irritable bowel syndrome) 09/04/2017  . Adjustment disorder with mixed anxiety and depressed mood 05/25/2016  . Breast lump in female 03/04/2015  . Pelvic pain in female 03/04/2015  . Frequent urination 03/04/2015  . Low back pain 03/04/2015  . Vitamin D deficiency 03/04/2015  . Chronic daily headache 02/19/2014  . Insomnia 06/06/2007  . [place] DISEASE 06/05/2007  . Anxious mood 04/04/2007  . Migraine headache 04/04/2007  . NECK PAIN, CHRONIC  04/04/2007   Past Medical History:  Diagnosis Date  . Anxiety    situational  . Chronic neck pain    from disc dz  . IBS (irritable bowel syndrome)    diarrhea predominant  . Insomnia    organic sleep disorder, failed multiple meds  . Migraine    Past Surgical History:  Procedure Laterality Date  . ABDOMINAL HYSTERECTOMY    . BREAST CYST ASPIRATION Right   . Deg C-S disk/spinal stenosis  06/2007  . epidural injections    . KNEE SURGERY     Social History   Tobacco Use  . Smoking status: Never Smoker  . Smokeless tobacco: Never Used  Substance Use Topics  . Alcohol use: Yes    Alcohol/week: 0.0 oz    Comment: occ/rare  . Drug use: No   Family History  Problem Relation Age of Onset  . Heart disease Other        CAD  . Heart disease Other        massive MI  . COPD Father        Deceased, 31  . Heart disease Father        CAD  . Migraines Father   . Migraines Brother   . Thyroid disease Mother        Living, 41  . Migraines Daughter    Allergies  Allergen Reactions  . Codeine     REACTION: vomiting  .  Codeine Phosphate     REACTION: vomiting  . Penicillins     REACTION: urticaria (hives)   Current Outpatient Medications on File Prior to Visit  Medication Sig Dispense Refill  . baclofen (LIORESAL) 10 MG tablet TAKE ONE TABLET BY MOUTH AT BEDTIME AS NEEDED FOR MUSCLE SPASMS 20 tablet 1  . busPIRone (BUSPAR) 15 MG tablet Take 0.5 tablets (7.5 mg total) by mouth 2 (two) times daily. 30 tablet 5  . butalbital-acetaminophen-caffeine (FIORICET, ESGIC) 50-325-40 MG tablet TAKE ONE TABLET BY MOUTH TWICE DAILY AS NEEDED FOR HEADACHE OR  MIGRAINE 30 tablet 1  . ondansetron (ZOFRAN) 4 MG tablet TAKE ONE TABLET BY MOUTH EVERY 8 HOURS AS NEEDED FOR NAUSEA OR  VOMITING 20 tablet 1  . rizatriptan (MAXALT) 5 MG tablet Take 1 tablet (5 mg total) by mouth as needed for migraine. May repeat in 2 hours if needed 10 tablet 5  . zolpidem (AMBIEN) 10 MG tablet Take 1 tablet (10  mg total) by mouth at bedtime as needed. 30 tablet 3   No current facility-administered medications on file prior to visit.      Review of Systems  Constitutional: Negative for activity change, appetite change, fatigue, fever and unexpected weight change.  HENT: Negative for congestion, ear pain, rhinorrhea, sinus pressure and sore throat.   Eyes: Negative for pain, redness and visual disturbance.  Respiratory: Negative for cough, shortness of breath and wheezing.   Cardiovascular: Negative for chest pain and palpitations.  Gastrointestinal: Negative for abdominal pain, blood in stool, constipation and diarrhea.  Endocrine: Negative for polydipsia and polyuria.  Genitourinary: Negative for dysuria, frequency and urgency.  Musculoskeletal: Positive for arthralgias, back pain and myalgias.       R elbow  Skin: Negative for pallor and rash.  Allergic/Immunologic: Negative for environmental allergies.  Neurological: Negative for dizziness, syncope and headaches.  Hematological: Negative for adenopathy. Does not bruise/bleed easily.  Psychiatric/Behavioral: Positive for decreased concentration. Negative for dysphoric mood. The patient is nervous/anxious.        Objective:   Physical Exam  Constitutional: She appears well-developed and well-nourished. No distress.  Well appearing  Anxious   HENT:  Head: Normocephalic and atraumatic.  Mouth/Throat: Oropharynx is clear and moist.  Eyes: Pupils are equal, round, and reactive to light. Conjunctivae and EOM are normal.  Neck: Normal range of motion. Neck supple. No JVD present. Carotid bruit is not present. No thyromegaly present.  Cardiovascular: Normal rate, regular rhythm, normal heart sounds and intact distal pulses. Exam reveals no gallop.  Pulmonary/Chest: Effort normal and breath sounds normal. No respiratory distress. She has no wheezes. She has no rales.  No crackles  Abdominal: Soft. Bowel sounds are normal. She exhibits no  distension, no abdominal bruit and no mass. There is no tenderness.  Musculoskeletal: She exhibits no edema.  R elbow- Lateral epicondyle tenderness  No swelling or change in sens or strength Pain on resisted elbow flexion and wrist flexion  Lymphadenopathy:    She has no cervical adenopathy.  Neurological: She is alert. She has normal reflexes. She displays normal reflexes. No cranial nerve deficit or sensory deficit. She exhibits normal muscle tone. Coordination normal.  No tremor  Skin: Skin is warm and dry. No rash noted. No pallor.  Psychiatric: Her speech is normal and behavior is normal. Thought content normal. Her mood appears anxious. Her affect is not blunt and not labile. She does not exhibit a depressed mood. She is inattentive.  Assessment & Plan:   Problem List Items Addressed This Visit      Musculoskeletal and Integument   Lateral epicondylitis    From reped movement at exercise class Disc ice/relative rest/ forearm band  Px low dose meloxicam daily with food Update if not starting to improve in a week or if worsening         Relevant Medications   meloxicam (MOBIC) 7.5 MG tablet     Other   ADHD (attention deficit hyperactivity disorder), combined type - Primary    Rev report from Irven Shelling  - testing pos for combined type Disc non pharm interventions recommended  Disc pharm options- pt wants to try vyvanse  Disc poss side eff incl habit/ sleep issues/headache/ gi upset and appetite change  Disc UDS / controlled med contract and protocol for this Given px for the 30 mg pill -one daily  Alert if side eff Update in 3 weeks re: if she needs higher dose or change  Continue to follow Continue also to work on anxiety and menopause symptoms  >25 minutes spent in face to face time with patient, >50% spent in counselling or coordination of care       Anxious mood    Pt is using buspar 7.5 prn  Suggested trial of 15 mg as needed- will try  She  does not think she needs it scheduled bid but may change her mind Disc imp of ADHD control Also the implications of menopause on mood       Menopause syndrome    Aches/pains/ anxiety and sleep changes  Disc what to expect  Will continue to try buspar prn  follow

## 2018-04-17 NOTE — Assessment & Plan Note (Signed)
Aches/pains/ anxiety and sleep changes  Disc what to expect  Will continue to try buspar prn  follow

## 2018-04-17 NOTE — Assessment & Plan Note (Signed)
From reped movement at exercise class Disc ice/relative rest/ forearm band  Px low dose meloxicam daily with food Update if not starting to improve in a week or if worsening

## 2018-04-17 NOTE — Patient Instructions (Addendum)
The next time you think you need the buspar try the 15 mg (1 pill) to see if it works better   Yoga and meditation would help some of your symptoms  Some meditation apps include Head space, Calm, Smiling mind, 10% happier   Start vyvanse and alert me if you have any side effects  In 3-4 weeks let me know how you are doing so I know if we need to increase the dose   For elbow- ice 10 minutes any time you can  Anti inflammatories help - like mobic  Hold off on classes until this improves

## 2018-04-17 NOTE — Assessment & Plan Note (Signed)
Pt is using buspar 7.5 prn  Suggested trial of 15 mg as needed- will try  She does not think she needs it scheduled bid but may change her mind Disc imp of ADHD control Also the implications of menopause on mood

## 2018-04-19 ENCOUNTER — Other Ambulatory Visit: Payer: Self-pay | Admitting: Family Medicine

## 2018-04-20 NOTE — Telephone Encounter (Signed)
Please clarify with her she is still on it  If so please refill for a year

## 2018-04-20 NOTE — Telephone Encounter (Signed)
Pt said she had stopped taking it for a little bit but she did restart it and pt said Dr. Glori Bickers is aware because they talked about it at her last OV. Med filled

## 2018-04-20 NOTE — Telephone Encounter (Signed)
Pt just had a f/u on 04/17/18, this Rx ins't on med list, please advise

## 2018-04-23 ENCOUNTER — Encounter: Payer: Self-pay | Admitting: Family Medicine

## 2018-04-23 ENCOUNTER — Other Ambulatory Visit: Payer: Self-pay | Admitting: Family Medicine

## 2018-04-23 DIAGNOSIS — G43C1 Periodic headache syndromes in child or adult, intractable: Secondary | ICD-10-CM

## 2018-04-23 NOTE — Telephone Encounter (Signed)
Name of Medication: fioricet Name of Pharmacy: [facility] on file Last Fill or Written Date and Quantity: 03/13/18 #30 with 1 refills Last Office Visit and Type: 04/17/18 (discuss ADHD med) Next Office Visit and Type: none scheduled  Last Controlled Substance Agreement Date: never done Last UDS: never done

## 2018-04-26 NOTE — Telephone Encounter (Signed)
Referral done Will route to [facility] 

## 2018-04-27 ENCOUNTER — Encounter (INDEPENDENT_AMBULATORY_CARE_PROVIDER_SITE_OTHER): Payer: Self-pay

## 2018-04-29 MED ORDER — LISDEXAMFETAMINE DIMESYLATE 40 MG PO CAPS: 40.0000 mg | ORAL_CAPSULE | ORAL | 0 refills | Status: DC

## 2018-05-01 ENCOUNTER — Ambulatory Visit: Payer: BLUE CROSS/BLUE SHIELD | Admitting: Neurology

## 2018-05-01 ENCOUNTER — Encounter: Payer: Self-pay | Admitting: Neurology

## 2018-05-01 VITALS — BP 148/84 | HR 75 | Ht 63.0 in | Wt 153.5 lb

## 2018-05-01 DIAGNOSIS — G43009 Migraine without aura, not intractable, without status migrainosus: Secondary | ICD-10-CM

## 2018-05-01 DIAGNOSIS — R519 Headache, unspecified: Secondary | ICD-10-CM

## 2018-05-01 DIAGNOSIS — R51 Headache: Secondary | ICD-10-CM | POA: Diagnosis not present

## 2018-05-01 MED ORDER — ERENUMAB-AOOE 70 MG/ML ~~LOC~~ SOAJ: 70.0000 mL | Freq: Once | SUBCUTANEOUS | 0 refills | Status: AC

## 2018-05-01 MED ORDER — RIZATRIPTAN BENZOATE 10 MG PO TABS: 10.0000 mg | ORAL_TABLET | ORAL | 5 refills | Status: DC | PRN

## 2018-05-01 MED ORDER — ERENUMAB-AOOE 70 MG/ML ~~LOC~~ SOAJ: 1.0000 [IU] | SUBCUTANEOUS | 11 refills | Status: DC

## 2018-05-01 NOTE — Progress Notes (Signed)
Follow-up Visit   Date: 05/01/18    Laurie [name] MRN: [id number] DOB: 04/28/1966   Interim History: Laurie [name] is a 52 y.o. right-handed Caucasian female with migraines, anxiety, IBS, and insomnia returning to the clinic for follow-up of chronic daily headaches.  The patient was accompanied to the clinic by self.  History of present illness: Headaches started in her early 63s after blunt head injury from weight-boarding. There was no loss of consciousness. MRI of the cerviacl spine showed mild disc degeneration. Since then, headaches periodically worsened so received steroid injections to her neck which helped. She also tried hormone replacement that helped her pain. Usually, she would get a headache once every three months, lasting 1-2 hours if she was able to take toradol and maxalt. Pain is located over the left temporal region, described as throbbing, and incapacitating. She endorses significant photosensitivity, nausea, vomiting, and phonophobia. She usually has associated neck tightness. Rest improves headaches and is worsened by stress and exertion. She tried neck physical therapy which exacerbated symptoms.    Since early 2015, her headaches have become more frequent and more intense. Headaches are now occuring almost daily, with only 1-2 headache-free days per week since January 2015. Pain is ranked as 8-9/10 when severe, and 5-6/10 daily. She saw her Marshall Cork, NP in April at which time CT head was normal and she was given toradol and depomedrol injection which did not help, so saw Dr. Glori Bickers in early May who started her on elavil.  Headaches were improved on Elavil, but she stopped this due to cognitive side effects. She was then off the topiramate which reduced the intensity of headaches, however discontinued this due to GI side effects.  She was then transitioned to nortriptyline which improved headaches, IBS, and mood, Due to sedation this was discontinued. In  2017, she was offered Cymbalta which she remained on until early 2019.  UPDATE 05/01/2018:  She is here for follow-up visit.  She was taking Cymbalta and stopped it due to concern of weight gain, but has worsening headaches.  She is having headaches about 4-5 times per week, lasting several hours to days.  She has been taking Fioricet almost daily, which is prescribed by her primary care provider, she is aware that I do not favor this medication due to high risk of rebound headaches. She would like to discuss alternative options should has Botox.  Medications:  Current Outpatient Medications on File Prior to Visit  Medication Sig Dispense Refill  . atorvastatin (LIPITOR) 80 MG tablet TAKE 1 TABLET BY MOUTH DAILY    . baclofen (LIORESAL) 10 MG tablet TAKE ONE TABLET BY MOUTH AT BEDTIME AS NEEDED FOR MUSCLE SPASMS 20 tablet 1  . busPIRone (BUSPAR) 15 MG tablet Take 0.5 tablets (7.5 mg total) by mouth 2 (two) times daily. 30 tablet 5  . butalbital-acetaminophen-caffeine (FIORICET, ESGIC) 50-325-40 MG tablet TAKE 1 TABLET BY MOUTH TWICE DAILY AS NEEDED FOR HEADACHE OR MIGRAINE 30 tablet 0  . doxepin (SINEQUAN) 25 MG capsule TAKE 1 CAPSULE(25 MG) BY MOUTH AT BEDTIME 30 capsule 11  . ibuprofen (ADVIL,MOTRIN) 800 MG tablet Take by mouth.    . lisdexamfetamine (VYVANSE) 40 MG capsule Take 1 capsule (40 mg total) by mouth every morning. 30 capsule 0  . meloxicam (MOBIC) 7.5 MG tablet Take 1 tablet (7.5 mg total) by mouth daily as needed for pain. With a full meal 30 tablet 3  . ondansetron (ZOFRAN) 4 MG tablet TAKE ONE TABLET BY MOUTH  EVERY 8 HOURS AS NEEDED FOR NAUSEA OR  VOMITING 20 tablet 1  . promethazine (PHENERGAN) 25 MG tablet Take by mouth.    . zolpidem (AMBIEN) 10 MG tablet Take 1 tablet (10 mg total) by mouth at bedtime as needed. 30 tablet 3   No current facility-administered medications on file prior to visit.     Allergies:  Allergies  Allergen Reactions  . Codeine     REACTION:  vomiting  . Codeine Phosphate     REACTION: vomiting  . Penicillins     REACTION: urticaria (hives)    Review of Systems:  CONSTITUTIONAL: No fevers, chills, night sweats, or weight loss.  EYES: No visual changes or eye pain ENT: No hearing changes.  No history of nose bleeds.   RESPIRATORY: No cough, wheezing and shortness of breath.   CARDIOVASCULAR: Negative for chest pain, and palpitations.   GI: +for abdominal discomfort, blood in stools or black stools.  No recent change in bowel habits.   GU:  No history of incontinence.   MUSCLOSKELETAL: No history of joint pain or swelling.  No myalgias.   SKIN: Negative for lesions, rash, and itching.   ENDOCRINE: Negative for cold or heat intolerance, polydipsia or goiter.   PSYCH:  No depression +anxiety symptoms.   NEURO: As Above.   Vital Signs:  BP (!) 148/84   Pulse 75   Ht 5\' 3"  (1.6 m)   Wt 153 lb 8 oz (69.6 kg)   LMP  (LMP Unknown)   SpO2 93%   BMI 27.19 kg/m   General Medical Exam:   General:  Well appearing, comfortable  Eyes/ENT: see cranial nerve examination.   Neck: No masses appreciated.  Full range of motion without tenderness.  No carotid bruits. Respiratory:  Clear to auscultation, good air entry bilaterally.   Cardiac:  Regular rate and rhythm, no murmur.   Ext:  No edema  Neurological Exam: MENTAL STATUS including orientation to time, place, person, recent and remote memory, attention span and concentration, language, and fund of knowledge is normal.  Speech is not dysarthric.  CRANIAL NERVES: No visual field defects. Pupils equal round and reactive to light.  Normal conjugate, extra-ocular eye movements in all directions of gaze.  No ptosis. Normal facial sensation.  Face is symmetric. Palate elevates symmetrically.  Tongue is midline.  MOTOR:  Motor strength is 5/5 in all extremities  No atrophy, fasciculations or abnormal movements.  No pronator drift.  Tone is normal.    MSRs:  Reflexes are 2+/4  throughout.  SENSORY:  Intact to vibration and light touch throughout.  COORDINATION/GAIT:  Normal finger-to- nose-finger and heel-to-shin.  Intact rapid alternating movements bilaterally.  Gait narrow based and stable.     Data: CT head 02/10/2014:  Negative   IMPRESSION: Chronic daily headaches, worsening since stopping Cymbalta.  She has previously tried:  Amitriptyline 20mg  (improved headaches, but stopped due to cognitive side effects), TPM (GI upset), Lyrica, nortriptyline (effective, sedation), Cymbalta (effective, weight gain).  Long discussion regarding previously tried medications and whether she would like to try any again, as they were limited by side effects, not effectiveness.  I also discussed alternative options including new CGRP antagonist medications as well as Botox . After reviewing risks and benefits and outcomes of each option, it was mutually decided to start [place].  She is aware this medication does not have any added benefit to her anxiety/depression or IBS, which has been helped by oral headache medications.  First dose  was administered in the office today.  We will start PA for Aimovig and send prescription for 70mg  monthly injection, which patient will either come to the office for administration or have someone assist.   I have asked patient to attempt to reduce the frequency of Fioricet due to risk of rebound headaches.  Once headaches are better controlled, I would favor her to stop this medication altogether. Increase Maxalt to 10mg  for acute headache treatment.  Return to clinic in 4 months  Greater than 50% of this 40 minute visit was spent in counseling, explanation of diagnosis, planning of further management, and coordination of care.    Thank you for allowing me to participate in patient's care.  If I can answer any additional questions, I would be pleased to do so.    Sincerely,    Jaymere Alen K. Posey Pronto, DO   Cc: Claiborne Billings, NP

## 2018-05-01 NOTE — Patient Instructions (Signed)
Start monthly injection of Aimovig  Return to clinic in 4 months

## 2018-05-19 ENCOUNTER — Other Ambulatory Visit: Payer: Self-pay | Admitting: Family Medicine

## 2018-05-21 ENCOUNTER — Telehealth: Payer: Self-pay

## 2018-05-21 NOTE — Telephone Encounter (Signed)
PLEASE NOTE: All timestamps contained within this report are represented as [place] Standard Time. CONFIDENTIALTY NOTICE: This fax transmission is intended only for the addressee. It contains information that is legally privileged, confidential or otherwise protected from use or disclosure. If you are not the intended recipient, you are strictly prohibited from reviewing, disclosing, copying using or disseminating any of this information or taking any action in reliance on or regarding this information. If you have received this fax in error, please notify us immediately by telephone so that we can arrange for its return to [place]. Phone: [phone], Toll-Free: [phone], Fax: [phone] Page: 1 of 4 Call Id: [id number] [place] Patient Name: Laurie [name] Gender: Female DOB: 08-31-1966 Age: 52 Y 58 M 8 D Return Phone Number: [phone] (Primary) Address: City/State/Zip: [place]  [address] Client [facility] Night - Client Client Site [facility]ician Tower, Roque Lias - MD Contact Type Call Who Is Calling Patient / Member / Family / Caregiver Call Type Triage / Clinical Relationship To Patient Self Return Phone Number [phone] (Primary) Chief Complaint Mouth Symptoms Reason for Call Symptomatic / Request for [place] states that she is having mouth swelling and ear pain Translation No Nurse Assessment Nurse: Laqueta Due, RN, Metallurgist (Eastern Time): 05/20/2018 12:04:36 PM Confirm and document reason for call. If symptomatic, describe symptoms. ---Caller states that has swelling behind back tooth and having ear pain. can touch jaw and doesn't hurt but stick finger on gum where wisdom tooth would be feels swollen. takes mobic (nerve pain in neck-aggravated with some exercise), didn't help.  not severe pain. can't eat on that side/chew. no fever. Does the patient have any new or worsening symptoms? ---Yes Will a triage be completed? ---Yes Related visit to physician within the last 2 weeks? ---No Does the PT have any chronic conditions? (i.e. diabetes, asthma, etc.) ---Yes List chronic conditions. ---nerve neck problems/pain and migraines (amavic injections for migraines) Is the patient pregnant or possibly pregnant? (Ask all females between the ages of 43-55) ---No Is this a behavioral health or substance abuse call? ---No Nurse: Laqueta Due, RN, Metallurgist (Eastern Time): 05/20/2018 12:21:17 PM Please select the assessment type ---Standing order Other current medications? ---Yes List current medications. ---Mobic, ambien Medication allergies? ---Yes List medication allergies. ---PCN Pharmacy name and phone number. ---[facility] [phone] PLEASE NOTE: All timestamps contained within this report are represented as [place] Standard Time. CONFIDENTIALTY NOTICE: This fax transmission is intended only for the addressee. It contains information that is legally privileged, confidential or otherwise protected from use or disclosure. If you are not the intended recipient, you are strictly prohibited from reviewing, disclosing, copying using or disseminating any of this information or taking any action in reliance on or regarding this information. If you have received this fax in error, please notify us immediately by telephone so that we can arrange for its return to [place]. Phone: [phone], Toll-Free: [phone], Fax: [phone] Page: 2 of 4 Call Id: [id number] Guidelines Guideline Title Affirmed Question Affirmed Notes Nurse Date/Time Eilene Ghazi Time) Mouth Pain [1] Mouth pain AND [2] present < 3 days Laqueta Due, RN, [employer] 05/20/2018 12:12:31 PM Earache Earache (Exceptions: brief ear pain of < 60 minutes duration, earache occurring during air travel [place],  Therapist, sports, [employer] 05/20/2018 12:18:54 PM Disp. Time Eilene Ghazi Time) Disposition Final User 05/20/2018 12:18:42 PM Call PCP within 24 Hours [place],  RN, Museum/gallery conservator 05/20/2018 12:31:08 PM Pharmacy Call Laqueta Due, RN, Amber Reason: [phone] 05/20/2018 12:21:12 PM See PCP within 24 Hours Yes Laqueta Due, Therapist, sports, Museum/gallery conservator Disposition Overriden: Home Care Override Reason: Patient's symptoms need a higher level of care Caller Disagree/Comply Disagree Caller Understands Yes PreDisposition Did not know what to do Care Advice Given Per Guideline CALL PCP WITHIN 24 HOURS: MAINTAIN GOOD ORAL HYGEINE: * Brush teeth with a soft bristle toothbrush after every meal and at bed time. * Floss your teeth daily (unless platelet count is known to be very low) SALT AND SODA MOUTHWASH: * You can make a good mouthwash yourself. Place 1/2 tsp of salt plus 1/2 tsp of baking soda in a cup (240 ml) of warm water. * Rinse your mouth every 2-3 hours while awake. AVOID: * Spicy foods (e.g., chili, hot peppers) * Acidic foods (e.g., citrus, oranges, lemons) * Alcohol * Mouthwashes containing alcohol * Tobacco CALL BACK IF: * Difficulty with swallowing occurs (e.g., can't swallow or drooling) * Difficulty with breathing occurs * You become worse. CARE ADVICE given per Mouth Pain (Adult) guideline. SEE PCP WITHIN 24 HOURS: * IF OFFICE WILL BE OPEN: You need to be seen within the next 24 hours. Call your doctor (or NP/PA) when the office opens and make an appointment. PAIN MEDICINES: * For pain relief, take acetaminophen, ibuprofen, or naproxen. * Use the lowest amount that makes your pain feel better. LOCAL COLD: * Apply a cold pack or a cold wet washcloth to outer ear for 20 min. to reduce pain while medicine takes effect (Note: some adults prefer local heat for 20 minutes) * CAUTION: hot or cold pack applied too long could cause burn or frostbite. EAR DISCHARGE: Wipe the discharge away as it appears. Avoid plugging with cotton.  (Reason: retained pus can cause infection of the lining of the ear canal) CALL BACK IF: * Severe pain persists over 2 hours after pain medicine * You become worse. CARE ADVICE given per Earache (Adult) guideline. Standing Orders Preparation Additional Instructions Route Frequency Duration Nurse Comments User Name Ciprodex 4 drops Ear Twice Daily 7 Days no refills [place], Therapist, sports, Teacher, adult education: Letta Moynahan, RN Date/Time (Eastern Time): 05/20/2018 12:29:21 PM caller states that isn't able to go anywhere tomorrow due to work, advised that could utilize the telehealth option tomorrow (doesn't require as much time as would to get to and wait at an UC or doctor office). verbalized understanding. PLEASE NOTE: All timestamps contained within this report are represented as [place] Standard Time. CONFIDENTIALTY NOTICE: This fax transmission is intended only for the addressee. It contains information that is legally privileged, confidential or otherwise protected from use or disclosure. If you are not the intended recipient, you are strictly prohibited from reviewing, disclosing, copying using or disseminating any of this information or taking any action in reliance on or regarding this information. If you have received this fax in error, please notify us immediately by telephone so that we can arrange for its return to [place]. Phone: [phone], Toll-Free: [phone], Fax: [phone] Page: 3 of 4 Call Id: [id number] Referrals REFERRED TO PCP OFFICE PLEASE NOTE: All timestamps contained within this report are represented as [place] Standard Time. CONFIDENTIALTY NOTICE: This fax transmission is intended only for the addressee. It contains information that is legally privileged, confidential or otherwise protected from use or disclosure. If you are not the intended recipient, you are strictly prohibited from reviewing, disclosing, copying using or disseminating any of this  information or taking  any action in reliance on or regarding this information. If you have received this fax in error, please notify us immediately by telephone so that we can arrange for its return to [place]. Phone: [phone], Toll-Free: [phone], Fax: [phone] Page: 4 of 4 Call Id: [id number] [facility] [address], [place] [address] [phone] Fax: [phone] [place] - Night Date: 05/20/2018 From: QI Department To: Tower, Roque Lias - MD This is an approved standing order given by our call center nurse on your behalf. Thank you. Date Eilene Ghazi Time): 05/20/2018 11:03:07 AM Triage RN: Letta Moynahan, RN NAME: Calton Golds PHONE NUMBER: [phone] (Primary) BIRTHDATE: 06/02/1966 [place] [address] CALLER: Self NAME: Rx Given Preparation Additional Instructions Route Frequency Duration Nurse Comments User Name Ciprodex 4 drops Ear Twice Daily 7 Days no refills [place], RN, Amber No signature is required on standing orders.

## 2018-05-22 ENCOUNTER — Ambulatory Visit: Payer: BLUE CROSS/BLUE SHIELD | Admitting: Psychology

## 2018-05-24 ENCOUNTER — Encounter: Payer: Self-pay | Admitting: *Deleted

## 2018-05-24 ENCOUNTER — Other Ambulatory Visit: Payer: Self-pay | Admitting: *Deleted

## 2018-05-24 ENCOUNTER — Encounter: Payer: Self-pay | Admitting: Family Medicine

## 2018-05-24 ENCOUNTER — Other Ambulatory Visit: Payer: Self-pay | Admitting: Family Medicine

## 2018-05-24 DIAGNOSIS — R519 Headache, unspecified: Secondary | ICD-10-CM

## 2018-05-24 DIAGNOSIS — R51 Headache: Principal | ICD-10-CM

## 2018-05-24 DIAGNOSIS — G43009 Migraine without aura, not intractable, without status migrainosus: Secondary | ICD-10-CM

## 2018-05-24 DIAGNOSIS — Z1231 Encounter for screening mammogram for malignant neoplasm of breast: Secondary | ICD-10-CM

## 2018-05-24 MED ORDER — ERENUMAB-AOOE 70 MG/ML ~~LOC~~ SOAJ
1.0000 [IU] | SUBCUTANEOUS | 11 refills | Status: DC
Start: 2018-05-24 — End: 2018-10-12

## 2018-05-25 ENCOUNTER — Other Ambulatory Visit: Payer: Self-pay | Admitting: Family Medicine

## 2018-05-25 MED ORDER — BUTALBITAL-APAP-CAFFEINE 50-325-40 MG PO TABS: ORAL_TABLET | ORAL | 0 refills | Status: DC

## 2018-05-25 MED ORDER — LISDEXAMFETAMINE DIMESYLATE 50 MG PO CAPS: 50.0000 mg | ORAL_CAPSULE | Freq: Every day | ORAL | 0 refills | Status: DC

## 2018-05-25 NOTE — Telephone Encounter (Signed)
Titrate vyvanse up to 50

## 2018-05-25 NOTE — Telephone Encounter (Signed)
Please let her know I filled this  Thanks

## 2018-05-25 NOTE — Telephone Encounter (Signed)
Sent mychart message letting her know it was filled

## 2018-05-25 NOTE — Telephone Encounter (Signed)
Name of Medication: fioricet Name of Pharmacy: [facility] Last Fill or Written Date and Quantity: 04/23/18 #30 with 0 refills Last Office Visit and Type: ADHD f/u 04/17/18 Next Office Visit and Type: none Last Controlled Substance Agreement Date: no UDS Last UDS: no UDS

## 2018-06-05 ENCOUNTER — Ambulatory Visit: Payer: Self-pay

## 2018-06-05 NOTE — Telephone Encounter (Signed)
Pt. Reports she had stopped her Cymbalta 2-3 months ago and had been doing well, but recently has felt like she needs to be on "some kind of antidepressant." "Things are bothering me more off the antidepressant - like arguments with my boyfriend, things at work." Does not want anything that will cause weight gain because she has been trying to lose weight and be healthy. Buspar makes her feel "weird". Vyvanse "is great, working well - has more energy and focus." Can't miss any work this week, other than today. No availability with Dr. Glori Bickers today. Pt. Asking if she can work her in today or call something in for her to try (antidepressant.). Please advise pt. Contact number [phone].  Answer Assessment - Initial Assessment Questions 1. CONCERN: "What happened that made you call today?"     Having some anxiety 2. ANXIETY SYMPTOM SCREENING: "Can you describe how you have been feeling?"  (e.g., tense, restless, panicky, anxious, keyed up, trouble sleeping, trouble concentrating)     Things are bothering more than when I was on Cymbalta 3. ONSET: "How long have you been feeling this way?"     For about 1 month 4. RECURRENT: "Have you felt this way before?"  If yes: "What happened that time?" "What helped these feelings go away in the past?"      Yes - but I have been off my antidepressants 5. RISK OF HARM - SUICIDAL IDEATION:  "Do you ever have thoughts of hurting or killing yourself?"  (e.g., yes, no, no but preoccupation with thoughts about death)   - INTENT:  "Do you have thoughts of hurting or killing yourself right NOW?" (e.g., yes, no, N/A)   - PLAN: "Do you have a specific plan for how you would do this?" (e.g., gun, knife, overdose, no plan, N/A)     No 6. RISK OF HARM - HOMICIDAL IDEATION:  "Do you ever have thoughts of hurting or killing someone else?"  (e.g., yes, no, no but preoccupation with thoughts about death)   - INTENT:  "Do you have thoughts of hurting or killing someone right NOW?"  (e.g., yes, no, N/A)   - PLAN: "Do you have a specific plan for how you would do this?" (e.g., gun, knife, no plan, N/A)      No 7. FUNCTIONAL IMPAIRMENT: "How have things been going for you overall in your life? Have you had any more difficulties than usual doing your normal daily activities?"  (e.g., better, same, worse; self-care, school, work, interactions)     No 8. SUPPORT: "Who is with you now?" "Who do you live with?" "Do you have family or friends nearby who you can talk to?"      Has family and her boyfriend 50. THERAPIST: "Do you have a counselor or therapist? Name?"     No 10. STRESSORS: "Has there been any new stress or recent changes in your life?"       No 11. CAFFEINE ABUSE: "Do you drink caffeinated beverages, and how much each day?" (e.g., coffee, tea, colas)       No 12. SUBSTANCE ABUSE: "Do you use any illegal drugs or alcohol?"       No 13. OTHER SYMPTOMS: "Do you have any other physical symptoms right now?" (e.g., chest pain, palpitations, difficulty breathing, fever)       No 14. PREGNANCY: "Is there any chance you are pregnant?" "When was your last menstrual period?"       No  Protocols used: ANXIETY AND  PANIC ATTACK-A-AH

## 2018-06-05 NOTE — Telephone Encounter (Signed)
I need to see her for that but am full today.   Whenever she can come that I have availability- I can write her a work excuse note for being gone for the appt

## 2018-06-05 NOTE — Telephone Encounter (Signed)
Pt notified of Dr. Tower's comments and appt scheduled  

## 2018-06-07 ENCOUNTER — Encounter: Payer: Self-pay | Admitting: Family Medicine

## 2018-06-07 ENCOUNTER — Ambulatory Visit: Payer: BLUE CROSS/BLUE SHIELD | Admitting: Family Medicine

## 2018-06-07 VITALS — BP 140/98 | HR 89 | Temp 98.0°F | Ht 63.0 in | Wt 152.8 lb

## 2018-06-07 DIAGNOSIS — F4323 Adjustment disorder with mixed anxiety and depressed mood: Secondary | ICD-10-CM | POA: Diagnosis not present

## 2018-06-07 DIAGNOSIS — G43009 Migraine without aura, not intractable, without status migrainosus: Secondary | ICD-10-CM

## 2018-06-07 DIAGNOSIS — E7800 Pure hypercholesterolemia, unspecified: Secondary | ICD-10-CM

## 2018-06-07 DIAGNOSIS — E785 Hyperlipidemia, unspecified: Secondary | ICD-10-CM | POA: Insufficient documentation

## 2018-06-07 DIAGNOSIS — F902 Attention-deficit hyperactivity disorder, combined type: Secondary | ICD-10-CM | POA: Diagnosis not present

## 2018-06-07 DIAGNOSIS — F430 Acute stress reaction: Secondary | ICD-10-CM

## 2018-06-07 DIAGNOSIS — R41840 Attention and concentration deficit: Secondary | ICD-10-CM

## 2018-06-07 DIAGNOSIS — F419 Anxiety disorder, unspecified: Secondary | ICD-10-CM | POA: Diagnosis not present

## 2018-06-07 LAB — LIPID PANEL
CHOLESTEROL: 262 mg/dL — AB (ref 0–200)
HDL: 49.5 mg/dL (ref >39.00)
LDL CALC: 182 mg/dL — AB (ref 0–99)
NonHDL: 212.67
TRIGLYCERIDES: 155 mg/dL — AB (ref 0.0–149.0)
Total CHOL/HDL Ratio: 5
VLDL: 31 mg/dL (ref 0.0–40.0)

## 2018-06-07 LAB — COMPREHENSIVE METABOLIC PANEL
ALBUMIN: 4.3 g/dL (ref 3.5–5.2)
ALT: 13 U/L (ref 0–35)
AST: 15 U/L (ref 0–37)
Alkaline Phosphatase: 74 U/L (ref 39–117)
BILIRUBIN TOTAL: 0.5 mg/dL (ref 0.2–1.2)
BUN: 20 mg/dL (ref 6–23)
CALCIUM: 9.4 mg/dL (ref 8.4–10.5)
CHLORIDE: 106 meq/L (ref 96–112)
CO2: 30 meq/L (ref 19–32)
Creatinine, Ser: 0.81 mg/dL (ref 0.40–1.20)
GFR: 78.74 mL/min (ref >60.00)
Glucose, Bld: 103 mg/dL — ABNORMAL HIGH (ref 70–99)
Potassium: 3.9 mEq/L (ref 3.5–5.1)
Sodium: 142 mEq/L (ref 135–145)
Total Protein: 6.8 g/dL (ref 6.0–8.3)

## 2018-06-07 LAB — TSH: TSH: 1.05 u[IU]/mL (ref 0.35–4.50)

## 2018-06-07 MED ORDER — FLUOXETINE HCL 20 MG PO TABS: 20.0000 mg | ORAL_TABLET | Freq: Every day | ORAL | 11 refills | Status: DC

## 2018-06-07 NOTE — Progress Notes (Signed)
Subjective:    Patient ID: Laurie [name], female    DOB: 1966-02-27, 52 y.o.   MRN: [id number]  HPI Here for mood problems   Also thinks lipitor is causing aches and pains  Is on 80 mg  A lot of heart dz in family  LDL was very high- done by on site doctor  2 years ago - has not had cholesterol checked since  No longer sees that doctor   Diet is much better   Now holding it (a lot better)  Off of it 5 months     Wt Readings from Last 3 Encounters:  06/07/18 152 lb 12.8 oz (69.3 kg)  05/01/18 153 lb 8 oz (69.6 kg)  04/17/18 156 lb (70.8 kg)   27.07 kg/m   bp is up today= was in a hurry and distressed coming here  BP Readings from Last 3 Encounters:  06/07/18 (!) 140/98  05/01/18 (!) 148/84  04/17/18 122/74   Takes buspar 7.5 mg bid  Doxepin 25 mg daily -- (took for sleep) - but it made her tired/ weight gain/ blah  vyvanse for ADD (50 mg)- is really helping (ADD and also helps her with binge eating)   Takes baclofen when she has a headache -not very helpful   buspar 7.5 bid -not helping anxiety at all , 15 mg bid makes her feel weird but not better   fiorcet works for some headaches (the ones that start in her neck)    Lorrin Mais does work for sleep for her   amovig- just got approval for  Painful shot  Was achey for a week  2nd shot- 2 d ago- not nearly as bad Starting to help   Dr Posey Pronto is her neurologist  mobic prn  maxalt prn   Very stressful job Difficulty with employees  Used to deal with these things better  More impulsive however now -irritability  More tearfulness in general   Took a co worker's klonopin -and it helped  It also helps for sleep   Is going to the gym  Afraid of gaining weight   She is open to a trial of ssri - watching for weight   Patient Active Problem List   Diagnosis Date Noted  . Hyperlipidemia 06/07/2018  . ADHD (attention deficit hyperactivity disorder), combined type 04/17/2018  . Lateral epicondylitis 04/17/2018   . Menopause syndrome 04/17/2018  . Poor concentration 03/06/2018  . IBS (irritable bowel syndrome) 09/04/2017  . Adjustment disorder with mixed anxiety and depressed mood 05/25/2016  . Breast lump in female 03/04/2015  . Pelvic pain in female 03/04/2015  . Frequent urination 03/04/2015  . Low back pain 03/04/2015  . Vitamin D deficiency 03/04/2015  . Chronic daily headache 02/19/2014  . Insomnia 06/06/2007  . [place] DISEASE 06/05/2007  . Anxious mood 04/04/2007  . Migraine headache 04/04/2007  . NECK PAIN, CHRONIC 04/04/2007   Past Medical History:  Diagnosis Date  . Anxiety    situational  . Chronic neck pain    from disc dz  . IBS (irritable bowel syndrome)    diarrhea predominant  . Insomnia    organic sleep disorder, failed multiple meds  . Migraine    Past Surgical History:  Procedure Laterality Date  . ABDOMINAL HYSTERECTOMY    . BREAST CYST ASPIRATION Right   . Deg C-S disk/spinal stenosis  06/2007  . epidural injections    . KNEE SURGERY     Social History   Tobacco Use  .  Smoking status: Never Smoker  . Smokeless tobacco: Never Used  Substance Use Topics  . Alcohol use: Yes    Alcohol/week: 0.0 standard drinks    Comment: occ/rare  . Drug use: No   Family History  Problem Relation Age of Onset  . Heart disease Other        CAD  . Heart disease Other        massive MI  . COPD Father        Deceased, 24  . Heart disease Father        CAD  . Migraines Father   . Migraines Brother   . Thyroid disease Mother        Living, 64  . Migraines Daughter    Allergies  Allergen Reactions  . Codeine     REACTION: vomiting  . Codeine Phosphate     REACTION: vomiting  . Penicillins     REACTION: urticaria (hives)   Current Outpatient Medications on File Prior to Visit  Medication Sig Dispense Refill  . baclofen (LIORESAL) 10 MG tablet TAKE 1 TABLET BY MOUTH AT BEDTIME AS NEEDED FOR MUSCLE SPASMS 20 tablet 1  .  butalbital-acetaminophen-caffeine (FIORICET, ESGIC) 50-325-40 MG tablet TAKE 1 TABLET BY MOUTH TWICE DAILY AS NEEDED FOR HEADACHE OR MIGRAINE 30 tablet 0  . Erenumab-aooe (AIMOVIG) 70 MG/ML SOAJ Inject 1 Units into the skin every 30 (thirty) days. 1 pen 11  . lisdexamfetamine (VYVANSE) 50 MG capsule Take 1 capsule (50 mg total) by mouth daily. 30 capsule 0  . meloxicam (MOBIC) 7.5 MG tablet Take 1 tablet (7.5 mg total) by mouth daily as needed for pain. With a full meal 30 tablet 3  . ondansetron (ZOFRAN) 4 MG tablet TAKE ONE TABLET BY MOUTH EVERY 8 HOURS AS NEEDED FOR NAUSEA OR  VOMITING 20 tablet 1  . rizatriptan (MAXALT) 10 MG tablet Take 1 tablet (10 mg total) by mouth as needed for migraine. May repeat in 2 hours if needed 10 tablet 5  . zolpidem (AMBIEN) 10 MG tablet TAKE 1 TABLET BY MOUTH AT BEDTIME AS NEEDED 30 tablet 0   No current facility-administered medications on file prior to visit.      Review of Systems  Constitutional: Positive for fatigue. Negative for activity change, appetite change, fever and unexpected weight change.  HENT: Negative for congestion, ear pain, rhinorrhea, sinus pressure and sore throat.   Eyes: Negative for pain, redness and visual disturbance.  Respiratory: Negative for cough, shortness of breath and wheezing.   Cardiovascular: Negative for chest pain and palpitations.  Gastrointestinal: Negative for abdominal pain, blood in stool, constipation and diarrhea.  Endocrine: Negative for polydipsia and polyuria.  Genitourinary: Negative for dysuria, frequency and urgency.  Musculoskeletal: Negative for arthralgias, back pain and myalgias.  Skin: Negative for pallor and rash.  Allergic/Immunologic: Negative for environmental allergies.  Neurological: Positive for headaches. Negative for dizziness, tremors, seizures, syncope, facial asymmetry, speech difficulty, weakness, light-headedness and numbness.  Hematological: Negative for adenopathy. Does not  bruise/bleed easily.  Psychiatric/Behavioral: Positive for decreased concentration, dysphoric mood and sleep disturbance. Negative for confusion and suicidal ideas. The patient is nervous/anxious.        Objective:   Physical Exam  Constitutional: She appears well-developed and well-nourished. She appears distressed.  Distressed mood today-arriving late  This improves as visit progresses   HENT:  Head: Normocephalic and atraumatic.  Right Ear: External ear normal.  Left Ear: External ear normal.  Mouth/Throat: Oropharynx is clear and moist.  Eyes: Pupils are equal, round, and reactive to light. Conjunctivae and EOM are normal. Right eye exhibits no discharge. Left eye exhibits no discharge. No scleral icterus.  Neck: Normal range of motion. Neck supple. No thyromegaly present.  Cardiovascular: Normal rate, regular rhythm and normal heart sounds.  No murmur heard. Pulmonary/Chest: Effort normal and breath sounds normal. No stridor. No respiratory distress. She has no wheezes.  Musculoskeletal: She exhibits no edema or deformity.  Lymphadenopathy:    She has no cervical adenopathy.  Neurological: She is alert. She displays normal reflexes. No cranial nerve deficit or sensory deficit. She exhibits normal muscle tone. Coordination normal.  No tremor   Skin: Skin is warm and dry. No rash noted. No erythema. No pallor.  Psychiatric: Her mood appears anxious. Her affect is not blunt, not labile and not inappropriate. Her speech is rapid and/or pressured. She is agitated. She is not slowed, not withdrawn and not actively hallucinating. Thought content is not paranoid. Cognition and memory are normal. She exhibits a depressed mood. She expresses no homicidal and no suicidal ideation.  Attentive Somewhat agitated and anxious - initially distressed and this improved   Almost tearful at times when discussing stressors and coping skills           Assessment & Plan:   Problem List Items  Addressed This Visit      Cardiovascular and Mediastinum   Migraine headache    Just started amovig - tolerated 2nd shot better  fiorcet prn  Working on better sleep habits Continues to see Dr Posey Pronto Also meloxicam and maxalt prn       Relevant Medications   FLUoxetine (PROZAC) 20 MG tablet     Other   ADHD (attention deficit hyperactivity disorder), combined type    Doing well with vyvanse 50 mg daily (though she thinks it wears off too soon)        Adjustment disorder with mixed anxiety and depressed mood - Primary    Worse with work stressors  Does not want to return to cymbalta (was difficult to get off of )  She is extremely hesitant to take anything that may cause wt gain  Tearful /difficulty handling things buspar did not help  I do not want to start benzodiazepine (already on controlled meds)  wellbutrin would likely worsen anxiety/irritability  Disc meditation  Ref for counseling  Trial of prozac 10 mg -titrate to 20 mg as tol  Discussed expectations of SSRI medication including time to effectiveness and mechanism of action, also poss of side effects (early and late)- including mental fuzziness, weight or appetite change, nausea and poss of worse dep or anxiety (even suicidal thoughts)  Pt voiced understanding and will stop med and update if this occurs   >25 minutes spent in face to face time with patient, >50% spent in counselling or coordination of care -incl disc of imp of self care  F/u approx 1 mo or earlier if needed       Relevant Orders   Ambulatory referral to Psychology   Hyperlipidemia    Pt states she has high LDL -was prev tx with 80 mg of atorvastatin by another doctor -and never had follow up labs  She watches diet  Was intol of the statin and stopped it  Unsure if she would tolerate smaller dose  Lab today for baseline lipids  Disc low sat/trans fat diet       Relevant Orders   Lipid panel (Completed)   Comprehensive metabolic  panel  (Completed)   Poor concentration    Continues vyvanse 50 mg- helpful for attentiveness Also curbs compulsive eating       Stress reaction    Ongoing along with depression  buspar was not helpful-she stopped it  Does not think vyvanse worsens it  Significant work stressors  Ref for counseling made       Relevant Medications   FLUoxetine (PROZAC) 20 MG tablet

## 2018-06-07 NOTE — Patient Instructions (Addendum)
Lab today Cholesterol /cmet/ tsh   Let's refer you to counseling   Start prozac 1/2 pill daily for 1-2 weeks then go up to 1 pill daily  If side effects or if you feel worse- stop it   Keep up good self care   Follow up in 1 months

## 2018-06-07 NOTE — Assessment & Plan Note (Signed)
Pt states she has high LDL -was prev tx with 80 mg of atorvastatin by another doctor -and never had follow up labs  She watches diet  Was intol of the statin and stopped it  Unsure if she would tolerate smaller dose  Lab today for baseline lipids  Disc low sat/trans fat diet

## 2018-06-07 NOTE — Assessment & Plan Note (Signed)
Continues vyvanse 50 mg- helpful for attentiveness Also curbs compulsive eating

## 2018-06-07 NOTE — Assessment & Plan Note (Signed)
Just started amovig - tolerated 2nd shot better  fiorcet prn  Working on better sleep habits Continues to see Dr Posey Pronto Also meloxicam and maxalt prn

## 2018-06-07 NOTE — Assessment & Plan Note (Signed)
Ongoing along with depression  buspar was not helpful-she stopped it  Does not think vyvanse worsens it  Significant work stressors  Ref for counseling made

## 2018-06-07 NOTE — Assessment & Plan Note (Signed)
Worse with work stressors  Does not want to return to cymbalta (was difficult to get off of )  She is extremely hesitant to take anything that may cause wt gain  Tearful /difficulty handling things buspar did not help  I do not want to start benzodiazepine (already on controlled meds)  wellbutrin would likely worsen anxiety/irritability  Disc meditation  Ref for counseling  Trial of prozac 10 mg -titrate to 20 mg as tol  Discussed expectations of SSRI medication including time to effectiveness and mechanism of action, also poss of side effects (early and late)- including mental fuzziness, weight or appetite change, nausea and poss of worse dep or anxiety (even suicidal thoughts)  Pt voiced understanding and will stop med and update if this occurs   >25 minutes spent in face to face time with patient, >50% spent in counselling or coordination of care -incl disc of imp of self care  F/u approx 1 mo or earlier if needed

## 2018-06-07 NOTE — Assessment & Plan Note (Signed)
Doing well with vyvanse 50 mg daily (though she thinks it wears off too soon)

## 2018-06-08 ENCOUNTER — Encounter: Payer: Self-pay | Admitting: Family Medicine

## 2018-06-10 ENCOUNTER — Telehealth: Payer: Self-pay | Admitting: Family Medicine

## 2018-06-10 DIAGNOSIS — R7309 Other abnormal glucose: Secondary | ICD-10-CM

## 2018-06-10 DIAGNOSIS — E7800 Pure hypercholesterolemia, unspecified: Secondary | ICD-10-CM

## 2018-06-10 MED ORDER — ROSUVASTATIN CALCIUM 10 MG PO TABS: 10.0000 mg | ORAL_TABLET | Freq: Every day | ORAL | 11 refills | Status: DC

## 2018-06-10 NOTE — Telephone Encounter (Signed)
Orders crestor

## 2018-06-16 ENCOUNTER — Other Ambulatory Visit: Payer: Self-pay | Admitting: Family Medicine

## 2018-06-19 ENCOUNTER — Other Ambulatory Visit: Payer: Self-pay | Admitting: Family Medicine

## 2018-06-19 MED ORDER — BUTALBITAL-APAP-CAFFEINE 50-325-40 MG PO TABS: ORAL_TABLET | ORAL | 0 refills | Status: DC

## 2018-06-19 NOTE — Telephone Encounter (Signed)
Name of Medication: Fioricet Name of Pharmacy: [facility] on file Last Fill or Written Date and Quantity: 05/25/18 #30 tabs with 0 refill Last Office Visit and Type: 06/07/18 Discuss anti-depressants Next Office Visit and Type: none scheduled  Last Controlled Substance Agreement Date: none on file Last MCR:FVOHK done

## 2018-06-19 NOTE — Telephone Encounter (Signed)
Name of Medication: Ambien Name of Pharmacy: [facility] on file Last Fill or Written Date and Quantity: 05/19/18 #30 tabs with 0 refill Last Office Visit and Type: 06/07/18 Discuss anti-depressants Next Office Visit and Type: none scheduled  Last Controlled Substance Agreement Date: none on file Last WCH:JSCBI done

## 2018-06-20 ENCOUNTER — Ambulatory Visit
Admission: RE | Admit: 2018-06-20 | Discharge: 2018-06-20 | Disposition: A | Discharge status: Home / Self Care | Payer: BLUE CROSS/BLUE SHIELD | Source: Ambulatory Visit | Attending: Family Medicine | Admitting: Family Medicine

## 2018-06-20 DIAGNOSIS — Z1231 Encounter for screening mammogram for malignant neoplasm of breast: Secondary | ICD-10-CM

## 2018-06-21 ENCOUNTER — Other Ambulatory Visit: Payer: Self-pay | Admitting: Family Medicine

## 2018-06-22 MED ORDER — LISDEXAMFETAMINE DIMESYLATE 50 MG PO CAPS: 50.0000 mg | ORAL_CAPSULE | Freq: Every day | ORAL | 0 refills | Status: DC

## 2018-06-22 NOTE — Telephone Encounter (Signed)
Name of [place] Name of Pharmacy:[facility] or Written Date and Quantity:05/25/18 #30 tabs with 0 refill Last Office Visit and Type:06/07/18 Discuss anti-depressants Next Office Visit and Type:none scheduled Last Controlled Substance Agreement Date:none on file Last YBN:LWHKN done

## 2018-07-13 ENCOUNTER — Other Ambulatory Visit: Payer: Self-pay | Admitting: Family Medicine

## 2018-07-13 MED ORDER — BUTALBITAL-APAP-CAFFEINE 50-325-40 MG PO TABS: ORAL_TABLET | ORAL | 0 refills | Status: DC

## 2018-07-13 NOTE — Telephone Encounter (Signed)
Last OV 05/2018. Last Rx 06/19/2018 #30

## 2018-07-16 ENCOUNTER — Other Ambulatory Visit: Payer: Self-pay | Admitting: Family Medicine

## 2018-07-23 ENCOUNTER — Other Ambulatory Visit: Payer: Self-pay | Admitting: Family Medicine

## 2018-07-23 MED ORDER — LISDEXAMFETAMINE DIMESYLATE 50 MG PO CAPS: 50.0000 mg | ORAL_CAPSULE | Freq: Every day | ORAL | 0 refills | Status: DC

## 2018-07-23 NOTE — Telephone Encounter (Signed)
Name of Medication: Vyvanse 50 mg Name of Pharmacy: [facility] or Written Date and Quantity: # 30 on 06/22/18 Last Office Visit and Type: 06/07/18 medication OV Next Office Visit and Type: none Last Controlled Substance Agreement Date: none Last TLZ:BCAF

## 2018-07-23 NOTE — Telephone Encounter (Signed)
Name of [place] Name of Pharmacy:[facility] or Written Date and Quantity:06/22/18 #30 tabs with 0 refill Last Office Visit and Type:06/07/18 Discuss anti-depressants Next Office Visit and Type:none scheduled Last Controlled Substance Agreement Date:none on file Last ELM:RAJHH done

## 2018-07-30 ENCOUNTER — Other Ambulatory Visit: Payer: Self-pay | Admitting: Family Medicine

## 2018-07-31 ENCOUNTER — Encounter: Payer: Self-pay | Admitting: *Deleted

## 2018-07-31 ENCOUNTER — Telehealth: Payer: Self-pay | Admitting: Neurology

## 2018-07-31 MED ORDER — BUTALBITAL-APAP-CAFFEINE 50-325-40 MG PO TABS: ORAL_TABLET | ORAL | 0 refills | Status: DC

## 2018-07-31 NOTE — Telephone Encounter (Signed)
Please find out if patient is taking Aimovig injections.  If she is, let's increase the dose to 140mg  since she is having persistent headaches.  Matthews Franks K. Posey Pronto, DO

## 2018-07-31 NOTE — Telephone Encounter (Signed)
Rx called in as prescribed. Called pt but no answer and pt's VM box was full (no message left)

## 2018-07-31 NOTE — Telephone Encounter (Signed)
Patient calling to check status of refill. States that she has a really bad headache. Would like this to be called in today. Also wants Dr Glori Bickers to be aware that she has a follow up with Dr Posey Pronto (neurologist) scheduled.   [id number] - [place], [place] - [address] AT [address]

## 2018-07-31 NOTE — Telephone Encounter (Signed)
Written for call in  If not able to call in please print and I will sign  My new phone has to be set up for px   I rev last note with Dr Posey Pronto also- did recommend that she not overuse this medication due to potential for rebound headaches  I recommend she call the neuro office to let them know that her headaches are still quite bad  I can also cc this note   Thanks

## 2018-07-31 NOTE — Telephone Encounter (Signed)
Name of Medication: Fioricet Name of Pharmacy: [facility] or Written Date and Quantity: 07/13/18 #30 tabs with 0 reiflls Last Office Visit and Type: 06/07/18 medication OV Next Office Visit and Type: none Last Controlled Substance Agreement Date: none Last NRW:CHJS

## 2018-08-02 ENCOUNTER — Encounter: Payer: Self-pay | Admitting: *Deleted

## 2018-08-03 ENCOUNTER — Other Ambulatory Visit: Payer: Self-pay | Admitting: *Deleted

## 2018-08-03 MED ORDER — ERENUMAB-AOOE 140 MG/ML ~~LOC~~ SOAJ: 140.0000 mg | SUBCUTANEOUS | 5 refills | Status: DC

## 2018-08-03 NOTE — Telephone Encounter (Signed)
Pt's neurologist reached out to her and she has f/u with them regarding treatment plan going forward

## 2018-08-03 NOTE — Telephone Encounter (Signed)
Patient sent a note back to me stating that she is taking the low dose of Aimovig.  Dr. Posey Pronto informed.

## 2018-08-23 ENCOUNTER — Telehealth: Payer: Self-pay | Admitting: *Deleted

## 2018-08-23 NOTE — Telephone Encounter (Signed)
Aimovig approved 07/03/18 until 09/30/18.

## 2018-08-26 ENCOUNTER — Other Ambulatory Visit: Payer: Self-pay | Admitting: Family Medicine

## 2018-08-26 NOTE — Telephone Encounter (Signed)
Last office visit 06/07/18. Last refilled 07/23/18 for #30 with no refills.  No future appointments  Ok to refill?

## 2018-08-27 MED ORDER — LISDEXAMFETAMINE DIMESYLATE 50 MG PO CAPS: 50.0000 mg | ORAL_CAPSULE | Freq: Every day | ORAL | 0 refills | Status: DC

## 2018-09-12 ENCOUNTER — Ambulatory Visit: Payer: Self-pay | Admitting: Neurology

## 2018-09-19 NOTE — Progress Notes (Signed)
PA initiated via [email or web address] for pt's  Aimovig 70MG /ML auto-injectors

## 2018-09-20 NOTE — Progress Notes (Signed)
Received notice that pt's Aimovig has been approved  Effective from 09/19/2018 through 09/18/2019.

## 2018-09-21 ENCOUNTER — Telehealth: Payer: Self-pay | Admitting: *Deleted

## 2018-09-21 NOTE — Telephone Encounter (Signed)
Reference number: [id number] 09-19-18 to 09-18-19

## 2018-10-01 ENCOUNTER — Other Ambulatory Visit: Payer: Self-pay | Admitting: *Deleted

## 2018-10-01 MED ORDER — LISDEXAMFETAMINE DIMESYLATE 50 MG PO CAPS: 50.0000 mg | ORAL_CAPSULE | Freq: Every day | ORAL | 0 refills | Status: DC

## 2018-10-01 NOTE — Telephone Encounter (Signed)
Name of Medication: vyvanse  Name of Pharmacy: [facility] or Written Date and Quantity: 08/27/18 #30 caps with 0 refills Last Office Visit and Type: 06/07/18 Discuss meds Next Office Visit and Type: non scheduled  Last Controlled Substance Agreement Date: never done Last XAJ:LUNGB done

## 2018-10-02 ENCOUNTER — Other Ambulatory Visit: Payer: Self-pay | Admitting: Family Medicine

## 2018-10-02 NOTE — Telephone Encounter (Signed)
Pt called to see what pharmacy vyvanse was sent to; advised pt vyvanse was sent to [facility] in [place]. Pt voiced understanding.

## 2018-10-03 ENCOUNTER — Encounter: Payer: Self-pay | Admitting: Family Medicine

## 2018-10-03 ENCOUNTER — Other Ambulatory Visit: Payer: Self-pay

## 2018-10-03 MED ORDER — LISDEXAMFETAMINE DIMESYLATE 50 MG PO CAPS: 50.0000 mg | ORAL_CAPSULE | Freq: Every day | ORAL | 0 refills | Status: DC

## 2018-10-03 MED ORDER — BUTALBITAL-APAP-CAFFEINE 50-325-40 MG PO TABS: ORAL_TABLET | ORAL | 1 refills | Status: DC

## 2018-10-03 NOTE — Telephone Encounter (Signed)
Last filled 07-31-18 #30 Last OV 06-07-18 Next OV 10-12-18 [employer]

## 2018-10-03 NOTE — Telephone Encounter (Signed)
Heather at [facility] said their pharmacy lost power on 10/01/18 and did not get electronic submissions on 10/01/18. Request refill on vyvanse sent again.   Name of Medication: vyvanse  Name of Pharmacy: [facility] or Written Date and Quantity: 08/27/18 #30 caps with 0 refills Last Office Visit and Type: 06/07/18 Discuss meds Next Office Visit and Type: non scheduled  Last Controlled Substance Agreement Date: never done Last JSE:GBTDV done

## 2018-10-03 NOTE — Telephone Encounter (Addendum)
Pt called to ck on status of vyvanse refill; advised pt done and sent to [facility]. Pt appreciative and voiced understanding. Pt called back and somehow in pts chart pharmacy was changed to [facility] [place]. Pt said she would pick up med at [facility] [place].

## 2018-10-03 NOTE — Telephone Encounter (Signed)
Pt said she got the Fioricet. Just needs the Vyvanse.

## 2018-10-11 NOTE — Progress Notes (Signed)
Follow-up Visit   Date: 10/12/18    Laurie [name] MRN: [id number] DOB: 05/06/1966   Interim History: Laurie [name] is a 52 y.o. right-handed Caucasian female with migraines, anxiety, IBS, and insomnia returning to the clinic for follow-up of chronic daily headaches.  The patient was accompanied to the clinic by self.  History of present illness: Headaches started in her early 57s after blunt head injury from weight-boarding. There was no loss of consciousness. MRI of the cerviacl spine showed mild disc degeneration. Since then, headaches periodically worsened so received steroid injections to her neck which helped. She also tried hormone replacement that helped her pain. Usually, she would get a headache once every three months, lasting 1-2 hours if she was able to take toradol and maxalt. Pain is located over the left temporal region, described as throbbing, and incapacitating. She endorses significant photosensitivity, nausea, vomiting, and phonophobia. She usually has associated neck tightness. Rest improves headaches and is worsened by stress and exertion. She tried neck physical therapy which exacerbated symptoms.    Since early 2015, her headaches have become more frequent and more intense. Headaches are now occuring almost daily, with only 1-2 headache-free days per week since January 2015. Pain is ranked as 8-9/10 when severe, and 5-6/10 daily. She saw her Marshall Cork, NP in April at which time CT head was normal and she was given toradol and depomedrol injection which did not help, so saw Dr. Glori Bickers in early May who started her on elavil.  Headaches were improved on Elavil, but she stopped this due to cognitive side effects. She was then off the topiramate which reduced the intensity of headaches, however discontinued this due to GI side effects.  She was then transitioned to nortriptyline which improved headaches, IBS, and mood, Due to sedation this was discontinued. In  2017, she was offered Cymbalta which she remained on until early 2019.  UPDATE 05/01/2018:  She was taking Cymbalta and stopped it due to concern of weight gain, but has worsening headaches.  She is having headaches about 4-5 times per week, lasting several hours to days.  She has been taking Fioricet almost daily, which is prescribed by her primary care provider, she is aware that I do not favor this medication due to high risk of rebound headaches. She would like to discuss alternative options should has Botox.  UPDATE 10/11/2018:  She is here for follow-up visit with new complaints of numbness/tingling of the hands and right foot.  Starting around 2018, she began having numbness over the right thumb and sharp sensation over the right great toe.  She also complains of numbness and tingling of the arm, especially when holding her phone.  She denies weakness.  She had history of neck pain and last MRI is from 2008 which shows disc herniation worse at at C4-5, C5-6 and C6-7 (mild), which does not cause cord compression or nerve impingement.  She also complains of generalized stiffness of the arm and legs, especially when getting up in the morning.   Due to minimial benefit with Aiomovig 70mg  every month, the dose was increased to 140mg  monthly.  She has taken the 2nd dose of the higher dose.  She has noticed improved intensity of headaches and has not missed work.   She gets headaches about 10 times per month and takes maxalt about 10 times per month.  Medications:  Current Outpatient Medications on File Prior to Visit  Medication Sig Dispense Refill  . baclofen (LIORESAL) 10  MG tablet TAKE 1 TABLET BY MOUTH AT BEDTIME AS NEEDED FOR MUSCLE SPASMS 20 tablet 1  . butalbital-acetaminophen-caffeine (FIORICET, ESGIC) 50-325-40 MG tablet TAKE 1 TABLET BY MOUTH TWICE DAILY AS NEEDED FOR HEADACHE OR MIGRAINE 30 tablet 1  . Erenumab-aooe (AIMOVIG) 140 MG/ML SOAJ Inject 140 mg into the skin every 30 (thirty) days.  1 pen 5  . FLUoxetine (PROZAC) 20 MG tablet Take 1 tablet (20 mg total) by mouth daily. 30 tablet 11  . lisdexamfetamine (VYVANSE) 50 MG capsule Take 1 capsule (50 mg total) by mouth daily. 30 capsule 0  . meloxicam (MOBIC) 7.5 MG tablet Take 1 tablet (7.5 mg total) by mouth daily as needed for pain. With a full meal 30 tablet 3  . ondansetron (ZOFRAN) 4 MG tablet TAKE ONE TABLET BY MOUTH EVERY 8 HOURS AS NEEDED FOR NAUSEA OR  VOMITING 20 tablet 1  . rizatriptan (MAXALT) 10 MG tablet Take 1 tablet (10 mg total) by mouth as needed for migraine. May repeat in 2 hours if needed 10 tablet 5  . rosuvastatin (CRESTOR) 10 MG tablet Take 1 tablet (10 mg total) by mouth daily. 30 tablet 11  . zolpidem (AMBIEN) 10 MG tablet TAKE 1 TABLET(10 MG) BY MOUTH AT BEDTIME AS NEEDED 30 tablet 3   No current facility-administered medications on file prior to visit.     Allergies:  Allergies  Allergen Reactions  . Codeine     REACTION: vomiting  . Codeine Phosphate     REACTION: vomiting  . Penicillins     REACTION: urticaria (hives)    Review of Systems:  CONSTITUTIONAL: No fevers, chills, night sweats, or weight loss.  EYES: No visual changes or eye pain ENT: No hearing changes.  No history of nose bleeds.   RESPIRATORY: No cough, wheezing and shortness of breath.   CARDIOVASCULAR: Negative for chest pain, and palpitations.   GI: +for abdominal discomfort, blood in stools or black stools.  No recent change in bowel habits.   GU:  No history of incontinence.   MUSCLOSKELETAL: No history of joint pain or swelling.  No myalgias.   SKIN: Negative for lesions, rash, and itching.   ENDOCRINE: Negative for cold or heat intolerance, polydipsia or goiter.   PSYCH:  No depression +anxiety symptoms.   NEURO: As Above.   Vital Signs:  BP 110/80   Resp 16   Ht 5\' 3"  (1.6 m)   Wt 150 lb 4 oz (68.2 kg)   LMP  (LMP Unknown)   BMI 26.62 kg/m   General Medical Exam:   General:  Well appearing, comfortable   Eyes/ENT: see cranial nerve examination.   Neck: No masses appreciated.  Full range of motion without tenderness.  No carotid bruits. Respiratory:  Clear to auscultation, good air entry bilaterally.   Cardiac:  Regular rate and rhythm, no murmur.   Ext:  No edema   Neurological Exam: MENTAL STATUS including orientation to time, place, person, recent and remote memory, attention span and concentration, language, and fund of knowledge is normal.  Speech is not dysarthric.  CRANIAL NERVES:  Pupils equal round and reactive to light.  Normal conjugate, extra-ocular eye movements in all directions of gaze.  No ptosis. Face is symmetric. Palate elevates symmetrically.  Tongue is midline.  MOTOR:  Motor strength is 5/5 in all extremities  No atrophy, fasciculations or abnormal movements.  No pronator drift.  Tone is normal.    MSRs:  Reflexes are 2+/4 throughout  SENSORY:  Intact to vibration and light touch throughout.  Tinel's sign is negative at the wrist bilaterally  COORDINATION/GAIT:  Normal finger-to- nose-finger and heel-to-shin.  Intact rapid alternating movements bilaterally.  Gait narrow based and stable.     Data: CT head 02/10/2014:  Negative MRI cervical spine wo contrast 02/05/2007: 1. C4-5, shallow broad base disc herniation which effaces the ventral subarachnoid space but does not compress the cord or show foraminal extension. No progressive change since 2005.   2. C5-6, spondylosis and protruding disc material, narrowing the ventral subarachnoid space. Foraminal encroachment on the right because of osteophytes. The right C6 nerve root could be effected. No progressive change since the previous exam.   3. C6-7, shallow disc protrusion without apparent neural compression. No progressive change.  IMPRESSION: 1.  Bilateral hand paresthesias, possibly due to entrapment neuropathy such as CTS.  Recommend NCS/EMG of the arms to better localize symptoms.  She does not want to proceed  with this and instead would prefer to try using wrist braces to see if this helps. She will call my office to schedule NCS, if symptoms get worse.  2.  Chronic daily headaches, improved intensity on Aimovig 140mg  monthly injections.  She continues to have frequent headaches and takes prn medications > 3 times per week, making her high risk for medication overuse headaches.   Continue maxalt 10mg  for severe migraine only She has previously tried:  Amitriptyline 20mg  (improved headaches, but stopped due to cognitive side effects), TPM (GI upset), Lyrica, nortriptyline (effective, sedation), Cymbalta (effective, weight gain).  3.  Right foot pain, doubt this is due to primary nerve disorder, as symptoms do not fit a nerve distribution.  Continue to follow.   4.  Morning stiffness is most suggestive of osteoarthritis.  Return to clinic in 6 months   Thank you for allowing me to participate in patient's care.  If I can answer any additional questions, I would be pleased to do so.    Sincerely,     K. Posey Pronto, DO   Cc: Claiborne Billings, NP

## 2018-10-12 ENCOUNTER — Ambulatory Visit: Payer: BLUE CROSS/BLUE SHIELD | Admitting: Neurology

## 2018-10-12 ENCOUNTER — Encounter: Payer: Self-pay | Admitting: Neurology

## 2018-10-12 VITALS — BP 110/80 | Resp 16 | Ht 63.0 in | Wt 150.2 lb

## 2018-10-12 DIAGNOSIS — R51 Headache: Secondary | ICD-10-CM | POA: Diagnosis not present

## 2018-10-12 DIAGNOSIS — R202 Paresthesia of skin: Secondary | ICD-10-CM

## 2018-10-12 DIAGNOSIS — R519 Headache, unspecified: Secondary | ICD-10-CM

## 2018-10-12 NOTE — Patient Instructions (Signed)
You can start using a wrist brace and see if this helps your hand numbness  If no improvement, please call my office to schedule nerve testing of the hands   ELECTROMYOGRAM AND NERVE CONDUCTION STUDIES (EMG/NCS) INSTRUCTIONS  How to Prepare The neurologist conducting the EMG will need to know if you have certain medical conditions. Tell the neurologist and other EMG lab personnel if you: . Have a pacemaker or any other electrical medical device . Take blood-thinning medications . Have hemophilia, a blood-clotting disorder that causes prolonged bleeding Bathing Take a shower or bath shortly before your exam in order to remove oils from your skin. Don't apply lotions or creams before the exam.  What to Expect You'll likely be asked to change into a hospital gown for the procedure and lie down on an examination table. The following explanations can help you understand what will happen during the exam.  . Electrodes. The neurologist or a technician places surface electrodes at various locations on your skin depending on where you're experiencing symptoms. Or the neurologist may insert needle electrodes at different sites depending on your symptoms.  . Sensations. The electrodes will at times transmit a tiny electrical current that you may feel as a twinge or spasm. The needle electrode may cause discomfort or pain that usually ends shortly after the needle is removed. If you are concerned about discomfort or pain, you may want to talk to the neurologist about taking a short break during the exam.  . Instructions. During the needle EMG, the neurologist will assess whether there is any spontaneous electrical activity when the muscle is at rest - activity that isn't present in healthy muscle tissue - and the degree of activity when you slightly contract the muscle.  He or she will give you instructions on resting and contracting a muscle at appropriate times. Depending on what muscles and nerves the  neurologist is examining, he or she may ask you to change positions during the exam.  After your EMG You may experience some temporary, minor bruising where the needle electrode was inserted into your muscle. This bruising should fade within several days. If it persists, contact your primary care doctor.

## 2018-10-31 ENCOUNTER — Other Ambulatory Visit: Payer: Self-pay | Admitting: Family Medicine

## 2018-10-31 ENCOUNTER — Encounter: Payer: Self-pay | Admitting: Family Medicine

## 2018-10-31 NOTE — Telephone Encounter (Signed)
Name of Medication: Ambien Name of Pharmacy: [facility] on file Last Fill or Written Date and Quantity: 06/19/18 #30 tabs with 3 refill Last Office Visit and Type: 06/07/18 Discuss anti-depressants Next Office Visit and Type: none scheduled  Last Controlled Substance Agreement Date: none on file Last YYT:KPTWS done

## 2018-11-05 ENCOUNTER — Encounter: Payer: Self-pay | Admitting: Family Medicine

## 2018-11-05 DIAGNOSIS — R102 Pelvic and perineal pain: Secondary | ICD-10-CM

## 2018-11-05 NOTE — Telephone Encounter (Signed)
Pt left v/m requesting appt soon for groin pain.

## 2018-11-05 NOTE — Telephone Encounter (Signed)
Ref to gyn for pelvic pain  Will route to [facility]

## 2018-11-05 NOTE — Telephone Encounter (Signed)
Message previously forwarded to Dr Glori Bickers for review and advice

## 2018-11-07 ENCOUNTER — Encounter: Payer: Self-pay | Admitting: Obstetrics and Gynecology

## 2018-11-07 ENCOUNTER — Ambulatory Visit: Payer: BLUE CROSS/BLUE SHIELD | Admitting: Obstetrics and Gynecology

## 2018-11-07 ENCOUNTER — Other Ambulatory Visit: Payer: Self-pay

## 2018-11-07 VITALS — BP 142/90 | HR 80 | Ht 63.0 in | Wt 146.2 lb

## 2018-11-07 DIAGNOSIS — R102 Pelvic and perineal pain: Secondary | ICD-10-CM | POA: Diagnosis not present

## 2018-11-07 DIAGNOSIS — Z113 Encounter for screening for infections with a predominantly sexual mode of transmission: Secondary | ICD-10-CM | POA: Diagnosis not present

## 2018-11-07 DIAGNOSIS — R109 Unspecified abdominal pain: Secondary | ICD-10-CM | POA: Diagnosis not present

## 2018-11-07 DIAGNOSIS — N393 Stress incontinence (female) (male): Secondary | ICD-10-CM | POA: Diagnosis not present

## 2018-11-07 LAB — POCT URINALYSIS DIPSTICK
Bilirubin, UA: NEGATIVE
Blood, UA: POSITIVE
GLUCOSE UA: NEGATIVE
KETONES UA: NEGATIVE
LEUKOCYTES UA: NEGATIVE
Nitrite, UA: NEGATIVE
Protein, UA: NEGATIVE
SPEC GRAV UA: 1.01 (ref 1.010–1.025)
Urobilinogen, UA: 0.2 E.U./dL
pH, UA: 5 (ref 5.0–8.0)

## 2018-11-07 MED ORDER — OXYCODONE-ACETAMINOPHEN 5-325 MG PO TABS: 1.0000 | ORAL_TABLET | ORAL | 0 refills | Status: DC | PRN

## 2018-11-07 NOTE — Patient Instructions (Signed)
Kegel Exercises  Kegel exercises help strengthen the muscles that support the rectum, vagina, small intestine, bladder, and uterus. Doing Kegel exercises can help:   Improve bladder and bowel control.   Improve sexual response.   Reduce problems and discomfort during pregnancy.  Kegel exercises involve squeezing your pelvic floor muscles, which are the same muscles you squeeze when you try to stop the flow of urine. The exercises can be done while sitting, standing, or lying down, but it is best to vary your position.  Exercises  1. Squeeze your pelvic floor muscles tight. You should feel a tight lift in your rectal area. If you are a female, you should also feel a tightness in your vaginal area. Keep your stomach, buttocks, and legs relaxed.  2. Hold the muscles tight for up to 10 seconds.  3. Relax your muscles.  Repeat this exercise 50 times a day or as many times as told by your health care provider. Continue to do this exercise for at least 4-6 weeks or for as long as told by your health care provider.  This information is not intended to replace advice given to you by your health care provider. Make sure you discuss any questions you have with your health care provider.  Document Released: 09/19/2012 Document Revised: 02/13/2017 Document Reviewed: 08/23/2015  Elsevier Interactive Patient Education  2019 [employer]

## 2018-11-07 NOTE — Progress Notes (Signed)
53 y.o. [id number] Significant Other White or Caucasian Not Hispanic or Latino female here for a consultation from Dr Glori Bickers for pelvic pain. Pain is intermittently on the right side. Pain occurred over the weekend. Pain feels "Like a pressure that is going to burst." Pain has been occurring for 2 years. She had a TAH ~20 years ago for AUB and endometriosis, she had bad scaring at the time of her hysterectomy. She has had vasomotor symptoms off and on for years, not currently as bad as they used to be. She used to be on ERT and testosterone, not for years. Some vaginal dryness, helped with lubrication. She has had MRI's and CT scans in the past for the pain. Last CT was in 1/17, normal ovaries were noted.  Her pain started ~3 years ago. When it started she noticed a bulge in the right groin (can't see it now since she has gained weight). She always has a baseline pressure/aching in the RLQ/pelvis, can radiate to her back. The aching changes in severity. In the last 2-3 weeks the pain is getting worse, hard to get through he work day. The pain ranges from a 3-8/10 in severity. Pain is worsened by walking, sitting for a long time, movements. "Feels like a tennis ball will explode". She has intermittent sharp pains in her back, stops her in her tracks. The pain hurts into her buttock, not down her leg. The pain has been particularly bad in the last several days. She has tried ibuprofen, not really helping and hurting her stomach. She requests stronger pain medication.  Sexually active, no pain, some dryness helped with lubricant. She has always had IBS with diarrhea. When her right pelvis feels swollen, her bowel don't always evacuate as well. She is having BM 1 x a day most of the time. No urinary c/o. Some GSI, worse with a URI, she wears a small pad, leaks a small amount daily. Some urgency to void, feels like she is going to leak, but hasn't yet.  Her prior partner cheated and she would like STD testing.      No LMP recorded (lmp unknown). Patient has had a hysterectomy.          Sexually active: Yes.    The current method of family planning is status post hysterectomy.    Exercising: No.  The patient does not participate in regular exercise at present. Smoker:  no  Health Maintenance: Pap:  2014 WNL History of abnormal Pap:  no MMG:  06/20/2018 Birads 1 negative BMD:   Never Colonoscopy: 11/18/2015 WNL TDaP: UTD per patient  Gardasil: N/A   reports that she has never smoked. She has never used smokeless tobacco. She reports current alcohol use. She reports that she does not use drugs. Rare ETOH. 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 She has 3 daughters and six grand children   Past Medical History:  Diagnosis Date  . Abnormal uterine bleeding   . Anemia   . Anxiety    situational  . Chronic neck pain    from disc dz  . Dysmenorrhea   . Endometriosis   . Fibroid   . IBS (irritable bowel syndrome)    diarrhea predominant  . Insomnia    organic sleep disorder, failed multiple meds  . Migraine   . Urinary incontinence     Past Surgical History:  Procedure Laterality Date  . ABDOMINAL HYSTERECTOMY    . ABDOMINAL HYSTERECTOMY    . BREAST BIOPSY  Right   . BREAST CYST ASPIRATION Right   . Deg C-S disk/spinal stenosis  06/2007  . DILATION AND CURETTAGE OF UTERUS    . ENDOMETRIAL ABLATION    . epidural injections    . KNEE SURGERY    . PELVIC LAPAROSCOPY    . TUBAL LIGATION      Current Outpatient Medications  Medication Sig Dispense Refill  . baclofen (LIORESAL) 10 MG tablet TAKE 1 TABLET BY MOUTH AT BEDTIME AS NEEDED FOR MUSCLE SPASMS 20 tablet 1  . butalbital-acetaminophen-caffeine (FIORICET, ESGIC) 50-325-40 MG tablet TAKE 1 TABLET BY MOUTH TWICE DAILY AS NEEDED FOR HEADACHE OR MIGRAINE 30 tablet 1  . Erenumab-aooe (AIMOVIG) 140 MG/ML SOAJ Inject 140 mg into the skin every 30 (thirty) days. 1 pen 5  . lisdexamfetamine (VYVANSE) 50 MG capsule Take 1 capsule  (50 mg total) by mouth daily. 30 capsule 0  . ondansetron (ZOFRAN) 4 MG tablet TAKE ONE TABLET BY MOUTH EVERY 8 HOURS AS NEEDED FOR NAUSEA OR  VOMITING 20 tablet 1  . rizatriptan (MAXALT) 10 MG tablet Take 1 tablet (10 mg total) by mouth as needed for migraine. May repeat in 2 hours if needed 10 tablet 5  . rosuvastatin (CRESTOR) 10 MG tablet Take 1 tablet (10 mg total) by mouth daily. 30 tablet 11  . zolpidem (AMBIEN) 10 MG tablet TAKE 1 TABLET(10 MG) BY MOUTH AT BEDTIME AS NEEDED 30 tablet 3  . FLUoxetine (PROZAC) 20 MG tablet Take 1 tablet (20 mg total) by mouth daily. (Patient not taking: Reported on 11/07/2018) 30 tablet 11  . meloxicam (MOBIC) 7.5 MG tablet Take 1 tablet (7.5 mg total) by mouth daily as needed for pain. With a full meal (Patient not taking: Reported on 11/07/2018) 30 tablet 3   No current facility-administered medications for this visit.     Family History  Problem Relation Age of Onset  . Heart disease Other        CAD  . Heart disease Other        massive MI  . COPD Father        Deceased, 75  . Heart disease Father        CAD  . Migraines Father   . Heart attack Father   . Migraines Brother   . Thyroid disease Mother        Living, 38  . Migraines Daughter   . Breast cancer Maternal Grandmother   . Heart attack Maternal Grandfather   . Heart disease Paternal Grandmother     Review of Systems  Constitutional: Negative.   HENT: Negative.   Eyes: Negative.   Respiratory: Negative.   Cardiovascular: Negative.   Gastrointestinal: Positive for nausea.       Change in bowel habits  Endocrine: Negative.   Genitourinary: Positive for pelvic pain.  Musculoskeletal: Negative.   Skin: Negative.   Allergic/Immunologic: Negative.   Neurological: Negative.   Hematological: Negative.   Psychiatric/Behavioral: Negative.   The nausea occurs with the severe pain  Exam:   BP (!) 142/90 (BP Location: Right Arm, Patient Position: Sitting, Cuff Size: Normal)    Pulse 80   Ht 5\' 3"  (1.6 m)   Wt 146 lb 3.2 oz (66.3 kg)   LMP  (LMP Unknown)   BMI 25.90 kg/m   Weight change: @WEIGHTCHANGE @ Height:   Height: 5\' 3"  (160 cm)  Ht Readings from Last 3 Encounters:  11/07/18 5\' 3"  (1.6 m)  10/12/18 5\' 3"  (1.6 m)  06/07/18  5\' 3"  (1.6 m)    General appearance: alert, cooperative and appears stated age Head: Normocephalic, without obvious abnormality, atraumatic Neck: no adenopathy, supple, symmetrical, trachea midline and thyroid normal to inspection and palpation Lungs: clear to auscultation bilaterally Cardiovascular: regular rate and rhythm Abdomen: soft, tender in the lower and lateral RLQ, also tender over the mons on the right. No rebound, no guarding non distended,  no masses,  no organomegaly Extremities: extremities normal, atraumatic, no cyanosis or edema Skin: Skin color, texture, turgor normal. No rashes or lesions Lymph nodes: Cervical, supraclavicular, and axillary nodes normal. No abnormal inguinal nodes palpated Neurologic: Grossly normal Lower back: not tender to palpation   Pelvic: External genitalia:  no lesions              Urethra:  normal appearing urethra with no masses, tenderness or lesions              Bartholins and Skenes: normal                 Vagina: normal appearing vagina with normal color and discharge, no lesions              Cervix: absent               Bimanual Exam:  Uterus:  uterus absent              Adnexa: no masses, tender in the right adnexal region.               Rectovaginal: Confirms               Anus:  normal sphincter tone, no lesions  Pelvic floor: not tender  Bladder: not tender    Chaperone was present for exam.  A:  3 year h/o RLQ abdominal/pelvic/right groin/mons pain. The pain has gotten more severe over the last few days. Normal CT 3 years ago. No pelvic floor tenderness, no masses on exam. The etiology of the pain is unclear.   S/P hysterectomy, suspect she is menopausal  Reports a h/o  endometriosis, if menopausal endometriosis should not be causing new pain (could have scar tissue)  IBS  Concerns over prior partner cheating, desires STD testing  GSI, some urinary urgency  P:   [place]  STD testing  Urine for ua, c&s  Return for gyn ultrasound, will also look in the right groin region  Percocet for pain (no recent narcotics in the [facility] database)  CC: Dr Loura Pardon Note sent

## 2018-11-08 ENCOUNTER — Ambulatory Visit (INDEPENDENT_AMBULATORY_CARE_PROVIDER_SITE_OTHER): Payer: BLUE CROSS/BLUE SHIELD

## 2018-11-08 ENCOUNTER — Encounter: Payer: Self-pay | Admitting: Obstetrics and Gynecology

## 2018-11-08 ENCOUNTER — Ambulatory Visit: Payer: BLUE CROSS/BLUE SHIELD | Admitting: Obstetrics and Gynecology

## 2018-11-08 VITALS — BP 130/74 | HR 76 | Ht 63.0 in | Wt 146.0 lb

## 2018-11-08 DIAGNOSIS — R102 Pelvic and perineal pain: Secondary | ICD-10-CM | POA: Diagnosis not present

## 2018-11-08 DIAGNOSIS — R109 Unspecified abdominal pain: Secondary | ICD-10-CM | POA: Diagnosis not present

## 2018-11-08 DIAGNOSIS — Z780 Asymptomatic menopausal state: Secondary | ICD-10-CM | POA: Diagnosis not present

## 2018-11-08 LAB — FOLLICLE STIMULATING HORMONE: FSH: 169.4 m[IU]/mL

## 2018-11-08 LAB — HEP, RPR, HIV PANEL
HEP B S AG: NEGATIVE
HIV Screen 4th Generation wRfx: NONREACTIVE
RPR: NONREACTIVE

## 2018-11-08 LAB — URINALYSIS, MICROSCOPIC ONLY
CASTS: NONE SEEN /LPF
RBC, UA: NONE SEEN /hpf (ref 0–2)

## 2018-11-08 LAB — URINE CULTURE

## 2018-11-08 NOTE — Progress Notes (Signed)
GYNECOLOGY  VISIT   HPI: 53 y.o.   Significant Other White or Caucasian Not Hispanic or Latino  female   [phone] with No LMP recorded (lmp unknown). Patient has had a hysterectomy.   here for f/u of pelvic/abdominal/mons pain.    GYNECOLOGIC HISTORY: No LMP recorded (lmp unknown). Patient has had a hysterectomy. Contraception:Hysterectomy  Menopausal hormone therapy: none        OB History    Gravida  4   Para  3   Term  3   Preterm      AB  1   Living  3     SAB  1   TAB      Ectopic      Multiple      Live Births  3              Patient Active Problem List   Diagnosis Date Noted  . Elevated glucose 06/10/2018  . Hyperlipidemia 06/07/2018  . ADHD (attention deficit hyperactivity disorder), combined type 04/17/2018  . Lateral epicondylitis 04/17/2018  . Menopause syndrome 04/17/2018  . Poor concentration 03/06/2018  . IBS (irritable bowel syndrome) 09/04/2017  . Adjustment disorder with mixed anxiety and depressed mood 05/25/2016  . Breast lump in female 03/04/2015  . Pelvic pain in female 03/04/2015  . Frequent urination 03/04/2015  . Low back pain 03/04/2015  . Vitamin D deficiency 03/04/2015  . Chronic daily headache 02/19/2014  . Insomnia 06/06/2007  . [place] DISEASE 06/05/2007  . Stress reaction 04/04/2007  . Migraine headache 04/04/2007  . NECK PAIN, CHRONIC 04/04/2007    Past Medical History:  Diagnosis Date  . Abnormal uterine bleeding   . Anemia   . Anxiety    situational  . Chronic neck pain    from disc dz  . Dysmenorrhea   . Endometriosis   . Fibroid   . IBS (irritable bowel syndrome)    diarrhea predominant  . Insomnia    organic sleep disorder, failed multiple meds  . Migraine   . Urinary incontinence     Past Surgical History:  Procedure Laterality Date  . ABDOMINAL HYSTERECTOMY    . ABDOMINAL HYSTERECTOMY    . BREAST BIOPSY Right   . BREAST CYST ASPIRATION Right   . Deg C-S disk/spinal stenosis   06/2007  . DILATION AND CURETTAGE OF UTERUS    . ENDOMETRIAL ABLATION    . epidural injections    . KNEE SURGERY    . PELVIC LAPAROSCOPY    . TUBAL LIGATION      Current Outpatient Medications  Medication Sig Dispense Refill  . baclofen (LIORESAL) 10 MG tablet TAKE 1 TABLET BY MOUTH AT BEDTIME AS NEEDED FOR MUSCLE SPASMS 20 tablet 1  . butalbital-acetaminophen-caffeine (FIORICET, ESGIC) 50-325-40 MG tablet TAKE 1 TABLET BY MOUTH TWICE DAILY AS NEEDED FOR HEADACHE OR MIGRAINE 30 tablet 1  . Erenumab-aooe (AIMOVIG) 140 MG/ML SOAJ Inject 140 mg into the skin every 30 (thirty) days. 1 pen 5  . FLUoxetine (PROZAC) 20 MG tablet Take 1 tablet (20 mg total) by mouth daily. (Patient not taking: Reported on 11/07/2018) 30 tablet 11  . lisdexamfetamine (VYVANSE) 50 MG capsule Take 1 capsule (50 mg total) by mouth daily. 30 capsule 0  . meloxicam (MOBIC) 7.5 MG tablet Take 1 tablet (7.5 mg total) by mouth daily as needed for pain. With a full meal (Patient not taking: Reported on 11/07/2018) 30 tablet 3  . ondansetron (ZOFRAN) 4 MG tablet TAKE ONE  TABLET BY MOUTH EVERY 8 HOURS AS NEEDED FOR NAUSEA OR  VOMITING 20 tablet 1  . oxyCODONE-acetaminophen (PERCOCET) 5-325 MG tablet Take 1 tablet by mouth every 4 (four) hours as needed for severe pain. 20 tablet 0  . rizatriptan (MAXALT) 10 MG tablet Take 1 tablet (10 mg total) by mouth as needed for migraine. May repeat in 2 hours if needed 10 tablet 5  . rosuvastatin (CRESTOR) 10 MG tablet Take 1 tablet (10 mg total) by mouth daily. 30 tablet 11  . zolpidem (AMBIEN) 10 MG tablet TAKE 1 TABLET(10 MG) BY MOUTH AT BEDTIME AS NEEDED 30 tablet 3   No current facility-administered medications for this visit.      ALLERGIES: Codeine; Codeine phosphate; and Penicillins  Family History  Problem Relation Age of Onset  . Heart disease Other        CAD  . Heart disease Other        massive MI  . COPD Father        Deceased, 30  . Heart disease Father        CAD   . Migraines Father   . Heart attack Father   . Migraines Brother   . Thyroid disease Mother        Living, 24  . Migraines Daughter   . Breast cancer Maternal Grandmother   . Heart attack Maternal Grandfather   . Heart disease Paternal Grandmother     Social History   Socioeconomic History  . Marital status: Significant Other    Spouse name: Not on file  . Number of children: 3  . Years of education: 44  . Highest education level: Some college, no degree  Occupational History  . Occupation: Investment banker, corporate: Health visitor  Social Needs  . Financial resource strain: Not on file  . Food insecurity:    Worry: Not on file    Inability: Not on file  . Transportation needs:    Medical: Not on file    Non-medical: Not on file  Tobacco Use  . Smoking status: Never Smoker  . Smokeless tobacco: Never Used  Substance and Sexual Activity  . Alcohol use: Yes    Alcohol/week: 0.0 standard drinks    Comment: occ/rare  . Drug use: No  . Sexual activity: Yes    Birth control/protection: Surgical  Lifestyle  . Physical activity:    Days per week: Not on file    Minutes per session: Not on file  . Stress: Not on file  Relationships  . Social connections:    Talks on phone: Not on file    Gets together: Not on file    Attends religious service: Not on file    Active member of club or organization: Not on file    Attends meetings of clubs or organizations: Not on file    Relationship status: Not on file  . Intimate partner violence:    Fear of current or ex partner: Not on file    Emotionally abused: Not on file    Physically abused: Not on file    Forced sexual activity: Not on file  Other Topics Concern  . 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   She lives alone on the 3rd floor.     Highest level of education:  Some college.  Has 3 daughters.    Review of Systems  Constitutional:  Weight gain   Craving   Gastrointestinal: Positive for abdominal pain.       Bloating Change in stool   Genitourinary: Positive for hematuria and urgency.       Loss of urin when cough or sneeze     PHYSICAL EXAMINATION:    BP 130/74   Pulse 76   Ht 5\' 3"  (1.6 m)   Wt 146 lb (66.2 kg)   LMP  (LMP Unknown)   BMI 25.86 kg/m     General appearance: alert, cooperative and appears stated age  Reviewed ultrasound images  ASSESSMENT 3 year h/o RLQ abdominal/pelvic/mons pain. She has had a hysterectomy, PMP FSH, atrophic ovaries on ultrasound. No signs of hernia on exam or ultrasound. Negative microscopic ua, culture pending. Doubt scar tissue is causing her pain (surgery was many years ago). No signs of muscular pain. Pain is worsened with movement, ? Nerve pain    PLAN Will have her f/u with her primary for further evaluation    An After Visit Summary was printed and given to the patient.  CC: Loura Pardon, MD

## 2018-11-09 LAB — CHLAMYDIA/GONOCOCCUS/TRICHOMONAS, NAA
CHLAMYDIA BY NAA: NEGATIVE
Gonococcus by NAA: NEGATIVE
TRICH VAG BY NAA: NEGATIVE

## 2018-11-26 ENCOUNTER — Encounter: Payer: Self-pay | Admitting: Obstetrics and Gynecology

## 2018-11-26 ENCOUNTER — Telehealth: Payer: Self-pay | Admitting: Obstetrics and Gynecology

## 2018-11-26 NOTE — Telephone Encounter (Signed)
Patient sent the following correspondence through [facility]. Routing to triage to assist patient with request.  Hello! I just had a couple of quick questions regarding my barrage of testing we did. Did you do a Pap smear as part of my exam. Not sure how that works after Hysterectomy. If so, results? Also, did tests (STD) include checking to r/o Herpes. Im sure I dont have it but thought I would ask since we checked everything else. I guess my next step will be to get appt with orthopedic doc to rule out something there. Im still having a lot of pain, especially with sleeping and trying to get through my work day. I want to thank you again for your compassion and your understanding of my fears with ex and the testing. 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 Hopefully, Ill get some answers soon for this pain and discomfort. Thank you Dr. Talbert Nan.

## 2018-11-27 NOTE — Telephone Encounter (Signed)
Please let the patient know that I didn't do a pap smear, she shouldn't need them any more. I didn't check for HSV, it is not part of the standard testing, partly because of false + HSV 2 testing. If she would like HSV tested we can do HSV IgG serology for type 1 and 2.  I checked RPR, HIV, Hep B, GC/CT/Trich.

## 2018-11-27 NOTE — Telephone Encounter (Signed)
Routing message to Dr. Talbert Nan for her review.

## 2018-11-28 NOTE — Telephone Encounter (Signed)
Spoke with patient, advised as seen below per Dr. Talbert Nan. Patient denies any STD concerns, declines HSV testing at this time. Patient verbalizes understanding and is thankful for return call.   Routing to provider for final review. Patient is agreeable to disposition. Will close encounter.

## 2018-11-29 ENCOUNTER — Other Ambulatory Visit: Payer: Self-pay | Admitting: Family Medicine

## 2018-11-30 MED ORDER — LISDEXAMFETAMINE DIMESYLATE 50 MG PO CAPS: 50.0000 mg | ORAL_CAPSULE | Freq: Every day | ORAL | 0 refills | Status: DC

## 2018-11-30 NOTE — Telephone Encounter (Signed)
Name of Medication: Vyvanse 50 mg Name of Pharmacy: [facility] Last Fill or Written Date and Quantity: # 30 on 10/03/18 Last Office Visit and Type: 06/07/18 mood problems Next Office Visit and Type: none scheduled

## 2018-12-10 ENCOUNTER — Other Ambulatory Visit: Payer: Self-pay | Admitting: Family Medicine

## 2018-12-11 NOTE — Telephone Encounter (Signed)
Before I refill this please check in with her to see how her headaches are doing - I know that she sees neurology  On average how often is she taking the medicine (I do not want her to get rebound headaches) Thanks

## 2018-12-11 NOTE — Telephone Encounter (Signed)
Last OV was 06/07/18, last filled on 10/03/18 #30 tabs with 1 refill, please advise

## 2018-12-12 NOTE — Telephone Encounter (Signed)
Pt said that she is still seeing neurology and they are giving her injections for her migraines. The injections have helped with decreasing the severity of her migraines but not the frequency. She still get them pretty often. Pt said that she only takes meds at the start of a migraine when she is at work and this med usually helps stop the migraine from getting worse. Pt said she really can't put a number on how often she needs the med because it varies so much depending on her migraines. But she did note that the med was last filled in December 2019 and she just ran out so it does last a pretty long time

## 2018-12-27 ENCOUNTER — Other Ambulatory Visit: Payer: Self-pay | Admitting: Family Medicine

## 2018-12-27 NOTE — Telephone Encounter (Signed)
Name of Medication: Vyvanse 50 mg  Name of Pharmacy: [facility] [address] (??? If that's ok because last month it was [facility] for pharmacy and I thinks they are suppose to keep Rx at one pharmacy)  Last Fill or Written Date and Quantity: # 30 on 11/30/18 Last Office Visit and Type: 06/07/18 mood problems Next Office Visit and Type: none scheduled

## 2018-12-27 NOTE — Telephone Encounter (Signed)
Refill is a few days early anyway-please call her and ask if she is changing pharmacies from now on  Send ref req back when due  Thanks

## 2018-12-27 NOTE — Telephone Encounter (Signed)
[facility] but [facility] is closer to her job, pt said to make it easier on [place] and to not cause any issues she will just [facility] in [place] [facility] been getting it filled at. Pharmacy changed back to [facility]

## 2018-12-28 MED ORDER — LISDEXAMFETAMINE DIMESYLATE 50 MG PO CAPS: 50.0000 mg | ORAL_CAPSULE | Freq: Every day | ORAL | 0 refills | Status: DC

## 2018-12-31 ENCOUNTER — Other Ambulatory Visit: Payer: Self-pay | Admitting: Family Medicine

## 2019-01-01 NOTE — Telephone Encounter (Signed)
Last OV was 06/07/18, zofran last filled on 02/19/18 #20 tabs with 1 refill, baclofen last filled on 05/19/18 #20 tabs with 1 refill, please advise

## 2019-01-04 ENCOUNTER — Other Ambulatory Visit: Payer: Self-pay | Admitting: Family Medicine

## 2019-01-04 MED ORDER — BUTALBITAL-APAP-CAFFEINE 50-325-40 MG PO TABS: 1.0000 | ORAL_TABLET | Freq: Two times a day (BID) | ORAL | 0 refills | Status: DC | PRN

## 2019-01-10 ENCOUNTER — Encounter: Payer: Self-pay | Admitting: Family Medicine

## 2019-01-10 MED ORDER — ROSUVASTATIN CALCIUM 10 MG PO TABS: 10.0000 mg | ORAL_TABLET | Freq: Every day | ORAL | 1 refills | Status: DC

## 2019-01-10 MED ORDER — ZOLPIDEM TARTRATE 10 MG PO TABS: ORAL_TABLET | ORAL | 0 refills | Status: DC

## 2019-01-10 MED ORDER — ONDANSETRON HCL 4 MG PO TABS: ORAL_TABLET | ORAL | 1 refills | Status: DC

## 2019-01-10 MED ORDER — BACLOFEN 10 MG PO TABS: ORAL_TABLET | ORAL | 1 refills | Status: DC

## 2019-01-14 ENCOUNTER — Other Ambulatory Visit: Payer: Self-pay

## 2019-01-14 ENCOUNTER — Ambulatory Visit (INDEPENDENT_AMBULATORY_CARE_PROVIDER_SITE_OTHER): Payer: BLUE CROSS/BLUE SHIELD | Admitting: Family Medicine

## 2019-01-14 DIAGNOSIS — G43009 Migraine without aura, not intractable, without status migrainosus: Secondary | ICD-10-CM

## 2019-01-15 NOTE — Progress Notes (Signed)
Patient cancelled appointment.

## 2019-01-24 ENCOUNTER — Other Ambulatory Visit: Payer: Self-pay | Admitting: Family Medicine

## 2019-01-24 MED ORDER — BUTALBITAL-APAP-CAFFEINE 50-325-40 MG PO TABS: 1.0000 | ORAL_TABLET | Freq: Two times a day (BID) | ORAL | 0 refills | Status: DC | PRN

## 2019-01-24 NOTE — Telephone Encounter (Signed)
error 

## 2019-01-24 NOTE — Telephone Encounter (Signed)
Name of Medication: Fioricet  Name of [place] or Written Date and Quantity:# 30 on 01/04/19  Last Office Visit and Type:03/30 for migraines pt cancelled; 06/07/18 for mood changes  Next Office Visit and Type:none scheduled   Please see my chart note in the phone note; pt has lost her job and will soon have no ins and request 90 day if possible.Please advise.

## 2019-02-01 ENCOUNTER — Other Ambulatory Visit: Payer: Self-pay | Admitting: Family Medicine

## 2019-02-01 DIAGNOSIS — R632 Polyphagia: Secondary | ICD-10-CM | POA: Insufficient documentation

## 2019-02-01 MED ORDER — LISDEXAMFETAMINE DIMESYLATE 70 MG PO CAPS: 70.0000 mg | ORAL_CAPSULE | Freq: Every day | ORAL | 0 refills | Status: DC

## 2019-02-01 NOTE — Telephone Encounter (Signed)
See pt's mychart message:"Dr Tower, I need to get this refilled. I'm still having issues with over-eating when I know I'm not hungry. Especially with losing job and being at home so much more. I saw dosing for binge eating is 50-70. I've been on 53 for awhile now. Can we try to increase to see if it will help. Thank you!"   Name of Pharmacy:[facility] Last Fill or Written Date and Quantity:# 30 with 0 refills on 12/28/18 Last Office Visit and Type:06/07/18 mood problems Next Office Visit and Type:none scheduled

## 2019-02-01 NOTE — Telephone Encounter (Signed)
I sent it  Watch out for side effects at that dose and let us know

## 2019-02-05 ENCOUNTER — Encounter: Payer: Self-pay | Admitting: Obstetrics and Gynecology

## 2019-02-06 ENCOUNTER — Telehealth: Payer: Self-pay | Admitting: Obstetrics and Gynecology

## 2019-02-06 MED ORDER — BREMELANOTIDE ACETATE 1.75 MG/0.3ML ~~LOC~~ SOAJ: 1.7500 mg | SUBCUTANEOUS | 0 refills | Status: DC | PRN

## 2019-02-06 NOTE — Telephone Encounter (Signed)
Spoke with patient. Patient reports decreased libido for the past 3-4 months. Reports hot flashes, weight gain and no energy. Hx of hysterectomy,  has ovaries. Has tried bio identical pellets in the past. Requesting to try vyvessi. Recommended WebEx visit for further discussion with Dr. Talbert Nan, patient declined at this time, states she is out of work and was just seen 11/08/18. Advised I will review with Dr. Talbert Nan and return call with recommendations, patient agreeable.  Dr. Talbert Nan, please advise.

## 2019-02-06 NOTE — Telephone Encounter (Signed)
Script sent. Please review the UTD side effects (printed out for you to review with her). Directions for use are on the script.

## 2019-02-06 NOTE — Telephone Encounter (Signed)
Message   Hello, I hope youre doing well. I feel like I have fell off a cliff with my sex drive (menopausal). I had a hysterectomy years and years ago and everything has been great until last 3-4 months. 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 Is there anything that you could prescribe to help me with this (dont want to gain weight bc Im already fighting this struggle. I was looking at the new injection vyvessi. Is this something I could possibly try? Thank you.

## 2019-02-07 NOTE — Telephone Encounter (Signed)
Left message to call Valyncia Wiens, RN at [facility] [phone].   

## 2019-02-07 NOTE — Telephone Encounter (Signed)
Spoke with patient, advised as seen below per Dr. Talbert Nan. Reviewed side effects per up to date. Patient states she is unsure if she will be able to afford the vyleesi at this time since she is not working and no insurance. Patient asking if compounded testosterone cream would be an option? Can this be used in combination with vyleesi? States she has a hx of low testosterone. Would she need OV or WebEx at this time? Advised OV may be needed for updated testosterone level. Will review with Dr. Talbert Nan and return call.   Vyleesi Rx not sent to pharmacy, will review options before sending.   Dr. Talbert Nan -please advise.

## 2019-02-07 NOTE — Telephone Encounter (Signed)
Left message to call Offie Waide, RN at [facility] [phone].   

## 2019-02-07 NOTE — Telephone Encounter (Signed)
Patient returning Jill's call.  °

## 2019-02-07 NOTE — Telephone Encounter (Signed)
She would need to come in for a free and total testosterone level. Then we could set up a Webex to discuss options if her levels are low.

## 2019-02-08 NOTE — Telephone Encounter (Signed)
Spoke with patient, advised as seen below per Dr. Talbert Nan. Patient request to proceed with [facility] Rx for now, will return call to office after Covid 19 restrictions have been lifted to schedule OV or WebEx if desired. [facility]. Patient verbalizes understanding and is agreeable.   [facility] to [facility].   Routing to provider for final review. Patient is agreeable to disposition. Will close encounter.

## 2019-02-08 NOTE — Telephone Encounter (Signed)
Patient is returning [place] call.

## 2019-02-13 ENCOUNTER — Other Ambulatory Visit: Payer: Self-pay | Admitting: Family Medicine

## 2019-02-16 ENCOUNTER — Other Ambulatory Visit: Payer: Self-pay | Admitting: Neurology

## 2019-02-24 ENCOUNTER — Other Ambulatory Visit: Payer: Self-pay

## 2019-02-24 DIAGNOSIS — F909 Attention-deficit hyperactivity disorder, unspecified type: Secondary | ICD-10-CM | POA: Diagnosis not present

## 2019-02-24 DIAGNOSIS — G43909 Migraine, unspecified, not intractable, without status migrainosus: Secondary | ICD-10-CM | POA: Diagnosis not present

## 2019-02-24 DIAGNOSIS — F332 Major depressive disorder, recurrent severe without psychotic features: Secondary | ICD-10-CM | POA: Insufficient documentation

## 2019-02-24 DIAGNOSIS — F329 Major depressive disorder, single episode, unspecified: Secondary | ICD-10-CM | POA: Diagnosis present

## 2019-02-25 ENCOUNTER — Other Ambulatory Visit: Payer: Self-pay

## 2019-02-25 ENCOUNTER — Encounter (HOSPITAL_COMMUNITY): Payer: Self-pay | Admitting: Emergency Medicine

## 2019-02-25 ENCOUNTER — Emergency Department (HOSPITAL_COMMUNITY)
Admission: EM | Admit: 2019-02-25 | Discharge: 2019-02-25 | Disposition: A | Discharge status: Home / Self Care | Payer: BLUE CROSS/BLUE SHIELD | Attending: Emergency Medicine | Admitting: Emergency Medicine

## 2019-02-25 DIAGNOSIS — G43909 Migraine, unspecified, not intractable, without status migrainosus: Secondary | ICD-10-CM

## 2019-02-25 DIAGNOSIS — F332 Major depressive disorder, recurrent severe without psychotic features: Secondary | ICD-10-CM

## 2019-02-25 LAB — CBC WITH DIFFERENTIAL/PLATELET
Abs Immature Granulocytes: 0.01 10*3/uL (ref 0.00–0.07)
Basophils Absolute: 0 10*3/uL (ref 0.0–0.1)
Basophils Relative: 1 %
Eosinophils Absolute: 0 10*3/uL (ref 0.0–0.5)
Eosinophils Relative: 1 %
HCT: 43.1 % (ref 36.0–46.0)
Hemoglobin: 14.3 g/dL (ref 12.0–15.0)
Immature Granulocytes: 0 %
Lymphocytes Relative: 55 %
Lymphs Abs: 3 10*3/uL (ref 0.7–4.0)
MCH: 30.2 pg (ref 26.0–34.0)
MCHC: 33.2 g/dL (ref 30.0–36.0)
MCV: 90.9 fL (ref 80.0–100.0)
Monocytes Absolute: 0.4 10*3/uL (ref 0.1–1.0)
Monocytes Relative: 7 %
Neutro Abs: 1.9 10*3/uL (ref 1.7–7.7)
Neutrophils Relative %: 36 %
Platelets: 249 10*3/uL (ref 150–400)
RBC: 4.74 MIL/uL (ref 3.87–5.11)
RDW: 12.1 % (ref 11.5–15.5)
WBC: 5.3 10*3/uL (ref 4.0–10.5)
nRBC: 0 % (ref 0.0–0.2)

## 2019-02-25 LAB — COMPREHENSIVE METABOLIC PANEL
ALT: 23 U/L (ref 0–44)
AST: 22 U/L (ref 15–41)
Albumin: 4.8 g/dL (ref 3.5–5.0)
Alkaline Phosphatase: 82 U/L (ref 38–126)
Anion gap: 11 (ref 5–15)
BUN: 11 mg/dL (ref 6–20)
CO2: 28 mmol/L (ref 22–32)
Calcium: 9.2 mg/dL (ref 8.9–10.3)
Chloride: 103 mmol/L (ref 98–111)
Creatinine, Ser: 0.69 mg/dL (ref 0.44–1.00)
GFR calc Af Amer: >60 mL/min (ref >60)
GFR calc non Af Amer: >60 mL/min (ref >60)
Glucose, Bld: 117 mg/dL — ABNORMAL HIGH (ref 70–99)
Potassium: 3.1 mmol/L — ABNORMAL LOW (ref 3.5–5.1)
Sodium: 142 mmol/L (ref 135–145)
Total Bilirubin: 0.6 mg/dL (ref 0.3–1.2)
Total Protein: 7.4 g/dL (ref 6.5–8.1)

## 2019-02-25 LAB — ETHANOL: Alcohol, Ethyl (B): <10 mg/dL (ref <10)

## 2019-02-25 LAB — ACETAMINOPHEN LEVEL: Acetaminophen (Tylenol), Serum: 11 ug/mL (ref 10–30)

## 2019-02-25 LAB — SALICYLATE LEVEL: Salicylate Lvl: <7 mg/dL (ref 2.8–30.0)

## 2019-02-25 MED ORDER — DIPHENHYDRAMINE HCL 50 MG/ML IJ SOLN
25.0000 mg | Freq: Once | INTRAMUSCULAR | Status: AC
  Administered 2019-02-25: 25 mg via INTRAVENOUS
  Filled 2019-02-25: qty 1

## 2019-02-25 MED ORDER — PROCHLORPERAZINE EDISYLATE 10 MG/2ML IJ SOLN
10.0000 mg | Freq: Once | INTRAMUSCULAR | Status: AC
  Administered 2019-02-25: 10 mg via INTRAVENOUS
  Filled 2019-02-25: qty 2

## 2019-02-25 MED ORDER — KETOROLAC TROMETHAMINE 30 MG/ML IJ SOLN
30.0000 mg | Freq: Once | INTRAMUSCULAR | Status: AC
  Administered 2019-02-25: 30 mg via INTRAVENOUS
  Filled 2019-02-25: qty 1

## 2019-02-25 MED ORDER — DEXAMETHASONE SODIUM PHOSPHATE 10 MG/ML IJ SOLN
10.0000 mg | Freq: Once | INTRAMUSCULAR | Status: AC
  Administered 2019-02-25: 10 mg via INTRAVENOUS
  Filled 2019-02-25: qty 1

## 2019-02-25 MED ORDER — POTASSIUM CHLORIDE CRYS ER 20 MEQ PO TBCR: 20.0000 meq | EXTENDED_RELEASE_TABLET | Freq: Two times a day (BID) | ORAL | 0 refills | Status: DC

## 2019-02-25 MED ORDER — SODIUM CHLORIDE 0.9 % IV BOLUS
1000.0000 mL | Freq: Once | INTRAVENOUS | Status: AC
  Administered 2019-02-25: 1000 mL via INTRAVENOUS

## 2019-02-25 NOTE — ED Provider Notes (Signed)
[facility] DEPT Provider Note   CSN: [id number] Arrival date & time: 02/24/19  2352    History   Chief Complaint Chief Complaint  Patient presents with  . Depression  . Migraine    HPI Laurie [name] is a 53 y.o. female with a history of migraines, situational anxiety, chronic neck pain, uterine fibroids, endometriosis presents to the emergency department with a chief complaint of "I've hit rock bottom."  The patient reports worsening low mood over the last few weeks.  She reports a number of recent increased stressors.  She reports that she recently lost her job.  She also reports that she has been in a relationship for the last 5 years, which just ended.  She reports that she has had a difficult time with Mother's Day in the past as she has 3 daughters with her ex-husband.  She reports that she feels extreme guilt on Mother's Day because she isn't a better mother.  She reports that in the past that she has been able to rely on her significant other, but did not have a support system in place due to the recent break-up.  She denies SI, HI, or auditory visual hallucinations.  No previous inpatient behavioral admissions.  She reports that she is followed by a therapist.   She also reports a left-sided headache for the last 3 days. She reports associated photophobia.  She has been taking her home migraine medication including Maxalt, Fioricet that improvement in her symptoms.  She denies nausea, vomiting, numbness, weakness, slurred speech, diplopia, neck pain or stiffness.  She reports that the headache is consistent with previous migraines.  She reports that she has had increased alcohol use over the last month, but typically will not have more than 1 drink at a time as it causes her to develop a migraine.  She denies IV or other illicit drug use.  No recent fevers, chills, chest pain, shortness of breath, or URI symptoms.     The history is provided by  the patient. No language interpreter was used.    Past Medical History:  Diagnosis Date  . Abnormal uterine bleeding   . Anemia   . Anxiety    situational  . Chronic neck pain    from disc dz  . Dysmenorrhea   . Endometriosis   . Fibroid   . IBS (irritable bowel syndrome)    diarrhea predominant  . Insomnia    organic sleep disorder, failed multiple meds  . Migraine   . Urinary incontinence     Patient Active Problem List   Diagnosis Date Noted  . Binge eating 02/01/2019  . Elevated glucose 06/10/2018  . Hyperlipidemia 06/07/2018  . ADHD (attention deficit hyperactivity disorder), combined type 04/17/2018  . Lateral epicondylitis 04/17/2018  . Menopause syndrome 04/17/2018  . Poor concentration 03/06/2018  . IBS (irritable bowel syndrome) 09/04/2017  . Adjustment disorder with mixed anxiety and depressed mood 05/25/2016  . Breast lump in female 03/04/2015  . Pelvic pain in female 03/04/2015  . Frequent urination 03/04/2015  . Low back pain 03/04/2015  . Vitamin D deficiency 03/04/2015  . Chronic daily headache 02/19/2014  . Insomnia 06/06/2007  . 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 Stress reaction 04/04/2007  . Migraine headache 04/04/2007  . NECK PAIN, CHRONIC 04/04/2007    Past Surgical History:  Procedure Laterality Date  . ABDOMINAL HYSTERECTOMY    . ABDOMINAL HYSTERECTOMY    . BREAST BIOPSY Right   . BREAST CYST  ASPIRATION Right   . Deg C-S disk/spinal stenosis  06/2007  . DILATION AND CURETTAGE OF UTERUS    . ENDOMETRIAL ABLATION    . epidural injections    . KNEE SURGERY    . PELVIC LAPAROSCOPY    . TUBAL LIGATION       OB History    Gravida  4   Para  3   Term  3   Preterm      AB  1   Living  3     SAB  1   TAB      Ectopic      Multiple      Live Births  3            Home Medications    Prior to Admission medications   Medication Sig Start Date End Date Taking? Authorizing Provider  AIMOVIG 140 MG/ML SOAJ  INJECT 140 MG INTO THE SKIN EVERY 30 DAYS Patient taking differently: Inject 140 mg into the skin every 30 (thirty) days.  02/18/19  Yes Patel, Donika K, DO  baclofen (LIORESAL) 10 MG tablet TAKE 1 TABLET BY MOUTH AT BEDTIME AS NEEDED FOR MUSCLE SPASMS Patient taking differently: Take 10 mg by mouth 2 (two) times daily as needed for muscle spasms.  01/10/19  Yes Tower, Wynelle Fanny, MD  butalbital-acetaminophen-caffeine (FIORICET, ESGIC) 50-325-40 MG tablet Take 1 tablet by mouth 2 (two) times daily as needed for headache. 01/24/19  Yes Tower, Wynelle Fanny, MD  doxepin (SINEQUAN) 25 MG capsule Take 25 mg by mouth at bedtime as needed (sleep).   Yes [provider]  lisdexamfetamine (VYVANSE) 70 MG capsule Take 1 capsule (70 mg total) by mouth daily. 02/01/19  Yes Tower, Wynelle Fanny, MD  rizatriptan (MAXALT) 10 MG tablet Take 1 tablet (10 mg total) by mouth as needed for migraine. May repeat in 2 hours if needed 05/01/18  Yes Patel, Donika K, DO  zolpidem (AMBIEN) 10 MG tablet TAKE 1 TABLET(10 MG) BY MOUTH AT BEDTIME AS NEEDED Patient taking differently: Take 10 mg by mouth at bedtime.  01/10/19  Yes Tower, Wynelle Fanny, MD  potassium chloride SA (K-DUR) 20 MEQ tablet Take 1 tablet (20 mEq total) by mouth 2 (two) times daily for 5 days. 02/25/19 03/02/19  Crixus Mcaulay A, PA-C  rosuvastatin (CRESTOR) 10 MG tablet Take 1 tablet (10 mg total) by mouth daily. 01/10/19   Tower, Wynelle Fanny, MD    Family History Family History  Problem Relation Age of Onset  . Heart disease Other        CAD  . Heart disease Other        massive MI  . COPD Father        Deceased, 82  . Heart disease Father        CAD  . Migraines Father   . Heart attack Father   . Migraines Brother   . Thyroid disease Mother        Living, 30  . Migraines Daughter   . Breast cancer Maternal Grandmother   . Heart attack Maternal Grandfather   . Heart disease Paternal Grandmother     Social History Social History   Tobacco Use  . Smoking  status: Never Smoker  . Smokeless tobacco: Never Used  Substance Use Topics  . Alcohol use: Yes    Alcohol/week: 0.0 standard drinks    Comment: occ/rare  . Drug use: No     Allergies   Codeine; Codeine phosphate;  and Penicillins   Review of Systems Review of Systems  Constitutional: Negative for activity change, chills and fever.  HENT: Negative for congestion and sore throat.   Eyes: Positive for photophobia. Negative for pain, discharge, redness, itching and visual disturbance.  Respiratory: Negative for shortness of breath.   Cardiovascular: Negative for chest pain.  Gastrointestinal: Negative for abdominal pain, diarrhea, nausea and vomiting.  Genitourinary: Negative for dysuria, flank pain, frequency, pelvic pain and urgency.  Musculoskeletal: Negative for back pain, myalgias, neck pain and neck stiffness.  Skin: Negative for rash.  Allergic/Immunologic: Negative for immunocompromised state.  Neurological: Positive for headaches. Negative for dizziness, seizures, syncope, weakness, light-headedness and numbness.  Psychiatric/Behavioral: Positive for dysphoric mood. Negative for confusion, self-injury and suicidal ideas.     Physical Exam Updated Vital Signs BP (!) 147/89   Pulse 66   Temp 98.5 F (36.9 C) (Oral)   Resp 20   Ht 5\' 2"  (1.575 m)   Wt 62.6 kg   LMP  (LMP Unknown)   SpO2 100%   BMI 25.24 kg/m   Physical Exam Vitals signs and nursing note reviewed.  Constitutional:      General: She is not in acute distress. HENT:     Head: Normocephalic.  Eyes:     Extraocular Movements: Extraocular movements intact.     Conjunctiva/sclera: Conjunctivae normal.     Pupils: Pupils are equal, round, and reactive to light.  Neck:     Musculoskeletal: Normal range of motion and neck supple. No neck rigidity.     Comments: No meningismus Cardiovascular:     Rate and Rhythm: Normal rate and regular rhythm.     Heart sounds: No murmur. No friction rub. No gallop.    Pulmonary:     Effort: Pulmonary effort is normal. No respiratory distress.     Breath sounds: No stridor. No wheezing, rhonchi or rales.  Chest:     Chest wall: No tenderness.  Abdominal:     General: There is no distension.     Palpations: Abdomen is soft. There is no mass.     Tenderness: There is no abdominal tenderness. There is no right CVA tenderness, left CVA tenderness, guarding or rebound.     Hernia: No hernia is present.  Lymphadenopathy:     Cervical: No cervical adenopathy.  Skin:    General: Skin is warm.     Findings: No rash.  Neurological:     Mental Status: She is alert and oriented to person, place, and time.     Cranial Nerves: No cranial nerve deficit.     Sensory: No sensory deficit.     Motor: No weakness.     Coordination: Coordination normal.     Gait: Gait normal.  Psychiatric:        Attention and Perception: Attention and perception normal.        Mood and Affect: Affect is tearful.        Speech: Speech normal.        Behavior: Behavior normal.        Thought Content: Thought content does not include homicidal or suicidal ideation. Thought content does not include homicidal or suicidal plan.     Comments: Very tearful throughout the entire history and physical exam.       ED Treatments / Results  Labs (all labs ordered are listed, but only abnormal results are displayed) Labs Reviewed  COMPREHENSIVE METABOLIC PANEL - Abnormal; Notable for the following components:  Result Value   Potassium 3.1 (*)    Glucose, Bld 117 (*)    All other components within normal limits  ETHANOL  CBC WITH DIFFERENTIAL/PLATELET  SALICYLATE LEVEL  ACETAMINOPHEN LEVEL  RAPID URINE DRUG SCREEN, HOSP PERFORMED    EKG None  Radiology No results found.  Procedures Procedures (including critical care time)  Medications Ordered in ED Medications  prochlorperazine (COMPAZINE) injection 10 mg (10 mg Intravenous Given 02/25/19 0239)  diphenhydrAMINE  (BENADRYL) injection 25 mg (25 mg Intravenous Given 02/25/19 0239)  ketorolac (TORADOL) 30 MG/ML injection 30 mg (30 mg Intravenous Given 02/25/19 0239)  sodium chloride 0.9 % bolus 1,000 mL (1,000 mLs Intravenous New Bag/Given 02/25/19 0246)  dexamethasone (DECADRON) injection 10 mg (10 mg Intravenous Given 02/25/19 0323)     Initial Impression / Assessment and Plan / ED Course  I have reviewed the triage vital signs and the nursing notes.  Pertinent labs & imaging results that were available during my care of the patient were reviewed by me and considered in my medical decision making (see chart for details).        53 year old female with a history of migraines, situational anxiety, chronic neck pain, uterine fibroids, endometriosis presenting with worsening depression.  States that she feels as if she has hit rock bottom.  She reports that she recently lost her job and ended a relationship for 5 years.  Reports that Mother's Day is particularly difficult for her.  She is also complaining of a headache for the last 3 days with associated photophobia.  She denies SI, HI, auditory or visual hallucinations.  She reports that she has never been evaluated in the ER before for behavioral health concerns.  At this time, reports that if she were recommended for inpatient admission that she would be agreeable.  Headache is consistent with her usual migraine and she has no neurologic deficits on exam.  Will order migraine cocktail and behavioral health screening labs.  Will consult TTS.  Patient is voluntary at this time.  Labs are notable for mild hypokalemia of 3.1.  Labs are otherwise unremarkable.  TTS was consulted who initially offered the patient inpatient admission.  Received a call from Delrae Sawyers, counselor, updating me that the patient was requesting outpatient resources and to be discharged home.  After speaking with Lytle Michaels, I reevaluated the patient.  My initial concern was that the patient had  recently lost a major support system due to her break-up.  She now discusses that her mother lives locally.  I also discussed that the patient has access to guns in the home since she has a permit to carry.  She again reiterated that she is having no SI, HI, or auditory visual hallucinations.  On psych evaluation, the patient was not tearful.  She initially stated that she had lost her job, but now reports that she has been furloughed.  Her primary concern is that she may be asked to return to work over the next few days. Spoke with Lytle Michaels regarding these updates who conferred with Lindon Romp, FNP.  Discharge with outpatient resources of is recommended at this time.  At this time, I am in agreement with this decision and plan.  The patient and I had a lengthy discussion regarding signs and symptoms to return to the ER.  She also reports that her migraine has resolved.  Will discharge the patient to home with outpatient resources and a short course of potassium chloride.  She is hemodynamically stable and  in no acute distress.  Safe for discharge to home with outpatient follow-up.  Final Clinical Impressions(s) / ED Diagnoses   Final diagnoses:  Migraine without status migrainosus, not intractable, unspecified migraine type  Severe episode of recurrent major depressive disorder, without psychotic features [facility])    ED Discharge Orders         Ordered    potassium chloride SA (K-DUR) 20 MEQ tablet  2 times daily     02/25/19 0500           Floria Brandau A, PA-C 02/25/19 0506    Varney Biles, MD 02/25/19 2334

## 2019-02-25 NOTE — ED Notes (Signed)
Security has patient valuables secured and key is with patient chart at nursing station.

## 2019-02-25 NOTE — ED Notes (Signed)
Pt on phone with TTS

## 2019-02-25 NOTE — ED Triage Notes (Signed)
Pt reports feeing stressed and depressed due to circomstances at home with family and job loss. Pt reports also having a migraine as well. Pt denies any SI/HI.

## 2019-02-25 NOTE — ED Notes (Signed)
Bed: WTR5 Expected date:  Expected time:  Means of arrival:  Comments: 

## 2019-02-25 NOTE — BH Assessment (Signed)
Tele Assessment Note   Patient Name: Laurie [name] MRN: [id number] Referring Physician: Joline Maxcy, PA-C. Location of Patient: [facility], [phone]. Location of Provider: [place] is an 53 y.o. female, who presents voluntary and unaccompanied to [facility]. Clinician asked the pt, "what brought you to the hospital?" Pt reported, stress and migraines. Pt reported the following stressors: being laid off due to COVID-19, the holiday (Mother's Day), trying to work on her relationship with her boyfriend of five years. Pt reported, they have problems with communication. Pt has handguns and AR-15. Pt denies, SI, HI, AVH, self-injurious behaviors.   Per EDP/PA note:  "Pt to emergency department with a chief complaint of "I've hit rock bottom." The patient reports worsening low mood over the last few weeks. She reports a number of recent increased stressors. She reports that she recently lost her job. She also reports that she has been in a relationship for the last 5 years, which just ended. She reports that she has had a difficult time with Mother's Day in the past as she has 3 daughters with her ex-husband. She reports that she feels extreme guilt on Mother's Day because she isn't a better mother. She reports that in the past that she has been able to rely on her significant other, but did not have a support system in place due to the recent break-up. She denies SI, HI, or auditory visual hallucinations. No previous inpatient behavioral admissions. She reports that she is followed by a therapist."   Pt denies abuse and substance use. Pt's UDS is pending. Pt is linked to [facility] for counseling her most recent session was two weeks ago and wanting a new counselor. Pt reported, Dr. Posey Pronto prescribed her Doxepin to help her stay asleep. Pt reported, she stopped taking her antidepressant for a bout a week because it was making her more depressed and tired. Pt denies,  previous inpatient admissions.   Pt present crying with logical, coherent speech. Pt's mood was depressed. Pt's thought process was coherent, relevant. Pt's judgement was partial. Pt was oriented x4. Pt's concentration was normal. Pt's insight and impulse control are fair. Pt reported, if discharged form [facility] she could contract for safety. Pt reported, if inpatient treatment she would sign-in voluntarily.  Diagnosis: Major Depressive Disorder, recurrent, severe without psychotic features.   Past Medical History:  Past Medical History:  Diagnosis Date  . Abnormal uterine bleeding   . Anemia   . Anxiety    situational  . Chronic neck pain    from disc dz  . Dysmenorrhea   . Endometriosis   . Fibroid   . IBS (irritable bowel syndrome)    diarrhea predominant  . Insomnia    organic sleep disorder, failed multiple meds  . Migraine   . Urinary incontinence     Past Surgical History:  Procedure Laterality Date  . ABDOMINAL HYSTERECTOMY    . ABDOMINAL HYSTERECTOMY    . BREAST BIOPSY Right   . BREAST CYST ASPIRATION Right   . Deg C-S disk/spinal stenosis  06/2007  . DILATION AND CURETTAGE OF UTERUS    . ENDOMETRIAL ABLATION    . epidural injections    . KNEE SURGERY    . PELVIC LAPAROSCOPY    . TUBAL LIGATION      Family History:  Family History  Problem Relation Age of Onset  . Heart disease Other        CAD  . Heart disease Other  massive MI  . COPD Father        Deceased, 61  . Heart disease Father        CAD  . Migraines Father   . Heart attack Father   . Migraines Brother   . Thyroid disease Mother        Living, 41  . Migraines Daughter   . Breast cancer Maternal Grandmother   . Heart attack Maternal Grandfather   . Heart disease Paternal Grandmother     Social History:  reports that she has never smoked. She has never used smokeless tobacco. She reports current alcohol use. She reports that she does not use drugs.  Additional Social History:   Alcohol / Drug Use Pain Medications: See MAR Prescriptions: See MAR Over the Counter: See MAR History of alcohol / drug use?: No history of alcohol / drug abuse(Pt denies. Pt's UDS is pending. )  CIWA: CIWA-Ar BP: (!) 147/89 Pulse Rate: 66 COWS:    Allergies:  Allergies  Allergen Reactions  . Codeine     REACTION: vomiting  . Codeine Phosphate     REACTION: vomiting  . Penicillins     REACTION: urticaria (hives) Did it involve swelling of the face/tongue/throat, SOB, or low BP? No Did it involve sudden or severe rash/hives, skin peeling, or any reaction on the inside of your mouth or nose? No Did you need to seek medical attention at a hospital or doctor's office? No When did it last happen? If all above answers are "NO", may proceed with cephalosporin use.     Home Medications: (Not in a hospital admission)   OB/GYN Status:  No LMP recorded (lmp unknown). Patient has had a hysterectomy.  General Assessment Data [facility] of Assessment: [facility] TTS Assessment: In system Is this a Tele or Face-to-Face Assessment?: Tele Assessment(Pt was assess via phone. ) Is this an Initial Assessment or a Re-assessment for this encounter?: Initial Assessment Patient Accompanied by:: N/A Language Other than English: No Living Arrangements: Other (Comment)(Alone. ) What gender do you identify as?: Female Marital status: Single Living Arrangements: Alone Can pt return to current living arrangement?: Yes Admission Status: Voluntary Is patient capable of signing voluntary admission?: Yes Referral Source: Self/Family/Friend Insurance type: [place] Living Arrangements: Alone Legal Guardian: Other:(Self. ) Name of Psychiatrist: NA Name of Therapist: [facility].   Education Status Is patient currently in school?: No Is the patient employed, unemployed or receiving disability?: Unemployed(Pt was laid off due to [place].)  Risk to self with the past 6  months Suicidal Ideation: No(Pt denies. ) Has patient been a risk to self within the past 6 months prior to admission? : No Suicidal Intent: No Has patient had any suicidal intent within the past 6 months prior to admission? : No(Pt denies. ) Is patient at risk for suicide?: No Suicidal Plan?: No Has patient had any suicidal plan within the past 6 months prior to admission? : No Access to Means: Yes Specify Access to Suicidal Means: Pt has access to handguns and AR-15. What has been your use of drugs/alcohol within the last 12 months?: Pending.  Previous Attempts/Gestures: No How many times?: 0 Other Self Harm Risks: NA Triggers for Past Attempts: None known Intentional Self Injurious Behavior: None(Pt denies. ) Family Suicide History: Yes(Uncle. ) Recent stressful life event(s): Other (Comment)(Mother's Day, lost job, issues with boyfriend.) Persecutory voices/beliefs?: No Depression: Yes Depression Symptoms: Feeling worthless/self pity, Loss of interest in usual pleasures,  Guilt, Fatigue, Isolating, Tearfulness, Insomnia, Despondent Substance abuse history and/or treatment for substance abuse?: No Suicide prevention information given to non-admitted patients: Not applicable  Risk to Others within the past 6 months Homicidal Ideation: No(Pt denies. ) Does patient have any lifetime risk of violence toward others beyond the six months prior to admission? : No(Pt denies. ) Thoughts of Harm to Others: No(Pt denies. ) Current Homicidal Intent: No Current Homicidal Plan: No(Pt denies. ) Access to Homicidal Means: No Identified Victim: NA History of harm to others?: No Assessment of Violence: None Noted Violent Behavior Description: NA Does patient have access to weapons?: Yes (Comment)(Handguns, AR-15.) Criminal Charges Pending?: No Does patient have a court date: No Is patient on probation?: No  Psychosis Hallucinations: None noted Delusions: None noted  Mental Status  Report Appearance/Hygiene: Other (Comment)(Pt was assessed via phone. ) Eye Contact: Other (Comment)(Pt was assessed via phone. ) Motor Activity: Other (Comment)(Pt was assessed via phone. ) Speech: Logical/coherent Level of Consciousness: Crying Mood: Depressed Affect: Other (Comment)(Pt was assessed via phone. ) Anxiety Level: Minimal Thought Processes: Coherent, Relevant Judgement: Partial Orientation: Person, Place, Time, Situation Obsessive Compulsive Thoughts/Behaviors: None  Cognitive Functioning Concentration: Normal Memory: Recent Intact Is patient IDD: No Insight: Fair Impulse Control: Fair Appetite: Good Have you had any weight changes? : (Pt reported eating emotionally.) Sleep: Decreased Total Hours of Sleep: (Pt reported, "it sucks." ) Vegetative Symptoms: Staying in bed  ADLScreening [facility] Assessment Services) Patient's cognitive ability adequate to safely complete daily activities?: Yes Patient able to express need for assistance with ADLs?: Yes Independently performs ADLs?: Yes (appropriate for developmental age)  Prior Inpatient Therapy Prior Inpatient Therapy: No  Prior Outpatient Therapy Prior Outpatient Therapy: Yes Prior Therapy Dates: Current. Prior Therapy Facilty/Provider(s): [facility]. Reason for Treatment: Counseling.  Does patient have an ACCT team?: No Does patient have Intensive In-House Services?  : No Does patient have [facility] services? : No Does patient have P4CC services?: No  ADL Screening (condition at time of admission) Patient's cognitive ability adequate to safely complete daily activities?: Yes Is the patient deaf or have difficulty hearing?: No Does the patient have difficulty seeing, even when wearing glasses/contacts?: Yes(Pt wears readers. ) Does the patient have difficulty concentrating, remembering, or making decisions?: Yes Patient able to express need for assistance with ADLs?: Yes Does the patient have difficulty  dressing or bathing?: No Independently performs ADLs?: Yes (appropriate for developmental age) Does the patient have difficulty walking or climbing stairs?: No Weakness of Legs: None([place]. ) Weakness of Arms/Hands: None  Home Assistive Devices/Equipment Home Assistive Devices/Equipment: Eyeglasses    Abuse/Neglect Assessment (Assessment to be complete while patient is alone) Abuse/Neglect Assessment Can Be Completed: Yes Physical Abuse: Denies(Pt denies. ) Verbal Abuse: Denies(Pt denies. ) Sexual Abuse: Denies(Pt denies. ) Exploitation of patient/patient's resources: Denies(Pt denies. ) Self-Neglect: Denies(Pt denies. )     Advance Directives (For Healthcare) Does Patient Have a Medical Advance Directive?: No          Disposition: Inpatient treatment was offered however pt declined. Per PA pt has supports (her mother) close by. Lindon Romp, FNP recommends discharge with OPT resources. Mia, PA is in agreement. OPT resources to be provided.   Disposition Initial Assessment Completed for this Encounter: Yes  This service was provided via telemedicine using a 2-way, interactive audio and video technology.  Names of all persons participating in this telemedicine service and their role in this encounter. Name: Audrie Kuri. Role: Patient.   Name: Lindon Romp, FNP.  Role: Nurse Practitioner.   Name: Vertell Novak, MS, [facility], [place]. Role: Counselor.       Vertell Novak 02/25/2019 3:13 AM

## 2019-02-25 NOTE — BHH Counselor (Addendum)
Clinician asked the pt if she had any family, friends supports she can contact to obtain collateral information. Pt declined to have clinician contact anyone.   Mia, PA to talk to pt about inpatient treatment. Will follow up with TTS.   Vertell Novak, [place], [facility], [facility] Triage Specialist [phone]

## 2019-02-25 NOTE — ED Notes (Signed)
Pt made aware that urine sample is needed, pt stated she is unable to provide at this time.

## 2019-02-25 NOTE — Progress Notes (Addendum)
Patient ID: Laurie [name], female   DOB: December 29, 1965, 53 y.o.   MRN: [id number]  Patient evaluated via telephone. Patient identified using two identifiers. Patient is alert and oriented x 4, pleasant, and cooperative. Speech is clear and coherent. Reports mood as depressed. States "I just felt like I really needed to talk to someone." Endorses sadness, anhedonia, tearfulness. Denies suicidal thoughts. States "I have never been to the point where I felt like I wanted to die." States that she is interested in outpatient psychiatry. States "I will be fine once I get rid of this headache."   No evidence of imminent risk to self or others at present.   Supportive therapy provided about ongoing stressors. Discussed crisis plan, support from social network, calling 911, coming to the Emergency Department, and calling Suicide Hotline. TTS provided with outpatient resources.

## 2019-02-25 NOTE — Discharge Instructions (Addendum)
Thank you for allowing me to care for you today in the Emergency Department.   Resume your home medications for your migraines as prescribed.  Your potassium was slightly low today.  Take 1 tablet of potassium chloride by mouth 2 times daily for the next 5 days.  Resources for outpatient counseling in the [place] area are attached.  Use the resources to get established with a new behavioral health provider.  You should return to the emergency department if you develop thoughts or plan of wanting to harm or kill yourself, others, if you start seeing or hearing things that may not actually be there, a severe headache with new numbness or weakness, if you start feeling off balance and having recurrent falls, or other new, concerning symptoms.

## 2019-02-26 ENCOUNTER — Encounter: Payer: Self-pay | Admitting: Family Medicine

## 2019-02-26 ENCOUNTER — Ambulatory Visit (INDEPENDENT_AMBULATORY_CARE_PROVIDER_SITE_OTHER): Payer: BLUE CROSS/BLUE SHIELD | Admitting: Family Medicine

## 2019-02-26 DIAGNOSIS — E876 Hypokalemia: Secondary | ICD-10-CM | POA: Diagnosis not present

## 2019-02-26 DIAGNOSIS — G43009 Migraine without aura, not intractable, without status migrainosus: Secondary | ICD-10-CM | POA: Diagnosis not present

## 2019-02-26 DIAGNOSIS — E7800 Pure hypercholesterolemia, unspecified: Secondary | ICD-10-CM | POA: Diagnosis not present

## 2019-02-26 DIAGNOSIS — F4323 Adjustment disorder with mixed anxiety and depressed mood: Secondary | ICD-10-CM | POA: Diagnosis not present

## 2019-02-26 DIAGNOSIS — G43709 Chronic migraine without aura, not intractable, without status migrainosus: Secondary | ICD-10-CM

## 2019-02-26 HISTORY — DX: Hypokalemia: E87.6

## 2019-02-26 MED ORDER — DOXEPIN HCL 50 MG PO CAPS: 50.0000 mg | ORAL_CAPSULE | Freq: Every day | ORAL | 3 refills | Status: DC

## 2019-02-26 NOTE — Patient Instructions (Addendum)
Our office will call you to set up a lab draw (hopefully at [facility])  Increase the doxepin to 50 mg daily for sleep and depression /anxiety as well as headaches  Talk to friends and family for support  If you become ready for counseling again please let me know

## 2019-02-26 NOTE — Assessment & Plan Note (Signed)
Worse with recent stressors  Seen in the ED/no SI Reviewed hospital records, lab results and studies in detail   She had stopped doxepin for ? Reasons and now not sleeping Intolerant of several medications for mood (unsure about prozac)  Will px doxepin 50 mg qhs for sleep/depression and headache prevention  Enc better self care including time for exercise /socialization (video/audio) and outdoor time  Offered new counseling ref- she declined due to financial concerns  Will follow  Enc her to call if side effects or problems

## 2019-02-26 NOTE — Assessment & Plan Note (Signed)
Enc to continue aimovig  Seeing neurology  Would lie to minimize bubalbital  Inc doxepin today to 50 mg to help with this as well as sleep and depression

## 2019-02-26 NOTE — Assessment & Plan Note (Signed)
Tolerating generic crestor  Will schedule labs for lipid profile and ast/alt  Eating much healthier as well

## 2019-02-26 NOTE — Progress Notes (Signed)
Virtual Visit via Video Note  I connected with Laurie [name] on 02/26/19 at  2:00 PM EDT by a video enabled telemedicine application and verified that I am speaking with the correct person using two identifiers.  Location: Patient: home Provider: office    I discussed the limitations of evaluation and management by telemedicine and the availability of in person appointments. The patient expressed understanding and agreed to proceed.  History of Present Illness: Here for ED f/u for migraine  She was seen in the ED on  5/11 She presented with c/o depressed mood and migraine   Increased stressors  Lost her job  5 y relationship ended -lacks support symptoms  3 kids/feels guilt  Increased etoh in past month   Had migraine for 3 days  Took maxalt and [employer] were notable for low K Admission on 02/25/2019, Discharged on 02/25/2019  Component Date Value Ref Range Status  . Sodium 02/25/2019 142  135 - 145 mmol/L Final  . Potassium 02/25/2019 3.1* 3.5 - 5.1 mmol/L Final  . Chloride 02/25/2019 103  98 - 111 mmol/L Final  . CO2 02/25/2019 28  22 - 32 mmol/L Final  . Glucose, Bld 02/25/2019 117* 70 - 99 mg/dL Final  . BUN 02/25/2019 11  6 - 20 mg/dL Final  . Creatinine, Ser 02/25/2019 0.69  0.44 - 1.00 mg/dL Final  . Calcium 02/25/2019 9.2  8.9 - 10.3 mg/dL Final  . Total Protein 02/25/2019 7.4  6.5 - 8.1 g/dL Final  . Albumin 02/25/2019 4.8  3.5 - 5.0 g/dL Final  . AST 02/25/2019 22  15 - 41 U/L Final  . ALT 02/25/2019 23  0 - 44 U/L Final  . Alkaline Phosphatase 02/25/2019 82  38 - 126 U/L Final  . Total Bilirubin 02/25/2019 0.6  0.3 - 1.2 mg/dL Final  . GFR calc non Af Amer 02/25/2019 >60  >60 mL/min Final  . GFR calc Af Amer 02/25/2019 >60  >60 mL/min Final  . Anion gap 02/25/2019 11  5 - 15 Final   Performed at [facility], [place] [address], [place], [place] [address]  . Alcohol, Ethyl (B) 02/25/2019 <10  <10 mg/dL Final   Comment: (NOTE) Lowest  detectable limit for serum alcohol is 10 mg/dL. For medical purposes only. Performed at [facility], [place] [address], [place], [place] [address]   . WBC 02/25/2019 5.3  4.0 - 10.5 K/uL Final  . RBC 02/25/2019 4.74  3.87 - 5.11 MIL/uL Final  . Hemoglobin 02/25/2019 14.3  12.0 - 15.0 g/dL Final  . HCT 02/25/2019 43.1  36.0 - 46.0 % Final  . MCV 02/25/2019 90.9  80.0 - 100.0 fL Final  . MCH 02/25/2019 30.2  26.0 - 34.0 pg Final  . MCHC 02/25/2019 33.2  30.0 - 36.0 g/dL Final  . RDW 02/25/2019 12.1  11.5 - 15.5 % Final  . Platelets 02/25/2019 249  150 - 400 K/uL Final  . nRBC 02/25/2019 0.0  0.0 - 0.2 % Final  . Neutrophils Relative % 02/25/2019 36  % Final  . Neutro Abs 02/25/2019 1.9  1.7 - 7.7 K/uL Final  . Lymphocytes Relative 02/25/2019 55  % Final  . Lymphs Abs 02/25/2019 3.0  0.7 - 4.0 K/uL Final  . Monocytes Relative 02/25/2019 7  % Final  . Monocytes Absolute 02/25/2019 0.4  0.1 - 1.0 K/uL Final  . Eosinophils Relative 02/25/2019 1  % Final  . Eosinophils Absolute 02/25/2019 0.0  0.0 -  0.5 K/uL Final  . Basophils Relative 02/25/2019 1  % Final  . Basophils Absolute 02/25/2019 0.0  0.0 - 0.1 K/uL Final  . Immature Granulocytes 02/25/2019 0  % Final  . Abs Immature Granulocytes 02/25/2019 0.01  0.00 - 0.07 K/uL Final   Performed at [facility], [place] [address], [place], [place] [address]  . Salicylate Lvl 15/17/6160 <7.0  2.8 - 30.0 mg/dL Final   Performed at [place] [address], [place], [place] [address]  . Acetaminophen (Tylenol), Serum 02/25/2019 11  10 - 30 ug/mL Final   Comment: (NOTE) Therapeutic concentrations vary significantly. A range of 10-30 ug/mL  may be an effective concentration for many patients. However, some  are best treated at concentrations outside of this range. Acetaminophen concentrations >150 ug/mL at 4 hours after ingestion  and >50 ug/mL at 12 hours after ingestion are often associated  with  toxic reactions. Performed at [facility], [place] [address], [place], [place] [address]     Her migraine resolved in the ED She was seen by the psych service  Given KCL 20 meq bid for 5 days  She had gone to counseling for 2-3 visits  Could not afford to continue and it was not that helpful  Tried some relaxation techniques She stopped taking doxepin - (for sleep) - adjusting to that / thought it was overly sedating   Sleep is a struggle- wakes up all night long  Tried melatonin   We tried fluoxetine in august - thinks it may have made her gain weight   Still furloughed from work  Oncologist when she will go back  She recruits for a [employer]   We increased her vyvanse the last time- her eating habits are improved   Due to get cholesterol checked  Also needs K checked    Review of Systems  Constitutional: Positive for malaise/fatigue. Negative for chills, diaphoresis, fever and weight loss.  HENT: Negative for sore throat.   Respiratory: Negative for cough and shortness of breath.   Cardiovascular: Negative for chest pain and palpitations.  Gastrointestinal: Negative for diarrhea, nausea and vomiting.  Musculoskeletal: Negative for myalgias.  Skin: Negative for rash.  Neurological: Positive for headaches. Negative for dizziness, sensory change, speech change and focal weakness.  Psychiatric/Behavioral: Positive for depression. Negative for memory loss and suicidal ideas. The patient is nervous/anxious and has insomnia.     Patient Active Problem List   Diagnosis Date Noted  . Hypokalemia 02/26/2019  . Binge eating 02/01/2019  . Elevated glucose 06/10/2018  . Hyperlipidemia 06/07/2018  . ADHD (attention deficit hyperactivity disorder), combined type 04/17/2018  . Lateral epicondylitis 04/17/2018  . Menopause syndrome 04/17/2018  . Poor concentration 03/06/2018  . IBS (irritable bowel syndrome) 09/04/2017  . Adjustment disorder with mixed  anxiety and depressed mood 05/25/2016  . Breast lump in female 03/04/2015  . Pelvic pain in female 03/04/2015  . Frequent urination 03/04/2015  . Low back pain 03/04/2015  . Vitamin D deficiency 03/04/2015  . Chronic daily headache 02/19/2014  . Insomnia 06/06/2007  . [place] DISEASE 06/05/2007  . Stress reaction 04/04/2007  . Migraine headache 04/04/2007  . NECK PAIN, CHRONIC 04/04/2007   Past Medical History:  Diagnosis Date  . Abnormal uterine bleeding   . Anemia   . Anxiety    situational  . Chronic neck pain    from disc dz  . Dysmenorrhea   . Endometriosis   . Fibroid   .  IBS (irritable bowel syndrome)    diarrhea predominant  . Insomnia    organic sleep disorder, failed multiple meds  . Migraine   . Urinary incontinence    Past Surgical History:  Procedure Laterality Date  . ABDOMINAL HYSTERECTOMY    . ABDOMINAL HYSTERECTOMY    . BREAST BIOPSY Right   . BREAST CYST ASPIRATION Right   . Deg C-S disk/spinal stenosis  06/2007  . DILATION AND CURETTAGE OF UTERUS    . ENDOMETRIAL ABLATION    . epidural injections    . KNEE SURGERY    . PELVIC LAPAROSCOPY    . TUBAL LIGATION     Social History   Tobacco Use  . Smoking status: Never Smoker  . Smokeless tobacco: Never Used  Substance Use Topics  . Alcohol use: Yes    Alcohol/week: 0.0 standard drinks    Comment: occ/rare  . Drug use: No   Family History  Problem Relation Age of Onset  . Heart disease Other        CAD  . Heart disease Other        massive MI  . COPD Father        Deceased, 60  . Heart disease Father        CAD  . Migraines Father   . Heart attack Father   . Migraines Brother   . Thyroid disease Mother        Living, 6  . Migraines Daughter   . Breast cancer Maternal Grandmother   . Heart attack Maternal Grandfather   . Heart disease Paternal Grandmother    Allergies  Allergen Reactions  . Codeine     REACTION: vomiting  . Codeine Phosphate     REACTION:  vomiting  . Penicillins     REACTION: urticaria (hives) Did it involve swelling of the face/tongue/throat, SOB, or low BP? No Did it involve sudden or severe rash/hives, skin peeling, or any reaction on the inside of your mouth or nose? No Did you need to seek medical attention at a hospital or doctor's office? No When did it last happen? If all above answers are "NO", may proceed with cephalosporin use.    Current Outpatient Medications on File Prior to Visit  Medication Sig Dispense Refill  . AIMOVIG 140 MG/ML SOAJ INJECT 140 MG INTO THE SKIN EVERY 30 DAYS (Patient taking differently: Inject 140 mg into the skin every 30 (thirty) days. ) 1 mL 5  . baclofen (LIORESAL) 10 MG tablet TAKE 1 TABLET BY MOUTH AT BEDTIME AS NEEDED FOR MUSCLE SPASMS (Patient taking differently: Take 10 mg by mouth 2 (two) times daily as needed for muscle spasms. ) 90 tablet 1  . butalbital-acetaminophen-caffeine (FIORICET, ESGIC) 50-325-40 MG tablet Take 1 tablet by mouth 2 (two) times daily as needed for headache. 90 tablet 0  . lisdexamfetamine (VYVANSE) 70 MG capsule Take 1 capsule (70 mg total) by mouth daily. 30 capsule 0  . potassium chloride SA (K-DUR) 20 MEQ tablet Take 1 tablet (20 mEq total) by mouth 2 (two) times daily for 5 days. 10 tablet 0  . rizatriptan (MAXALT) 10 MG tablet Take 1 tablet (10 mg total) by mouth as needed for migraine. May repeat in 2 hours if needed 10 tablet 5  . rosuvastatin (CRESTOR) 10 MG tablet Take 1 tablet (10 mg total) by mouth daily. 90 tablet 1  . zolpidem (AMBIEN) 10 MG tablet TAKE 1 TABLET(10 MG) BY MOUTH AT BEDTIME AS NEEDED (Patient taking  differently: Take 10 mg by mouth at bedtime. ) 90 tablet 0   No current facility-administered medications on file prior to visit.     Observations/Objective: Patient appears well/her normal self Not distressed or in pain  No facial swelling or asymmetry/ no slurring  Nl voice  No cough or sob with speech  No obv tremor   Pleasant with mildly depressed affect (not blunted) , she talks candidly about stressors  Normal cognition/mentally sharp   Assessment and Plan: Problem List Items Addressed This Visit      Cardiovascular and Mediastinum   Chronic migraine w/o aura w/o status migrainosus, not intractable   Relevant Medications   doxepin (SINEQUAN) 50 MG capsule   RESOLVED: Migraine headache    Enc to continue aimovig  Seeing neurology  Would lie to minimize bubalbital  Inc doxepin today to 50 mg to help with this as well as sleep and depression       Relevant Medications   doxepin (SINEQUAN) 50 MG capsule     Other   Adjustment disorder with mixed anxiety and depressed mood - Primary    Worse with recent stressors  Seen in the ED/no SI Reviewed hospital records, lab results and studies in detail   She had stopped doxepin for ? Reasons and now not sleeping Intolerant of several medications for mood (unsure about prozac)  Will px doxepin 50 mg qhs for sleep/depression and headache prevention  Enc better self care including time for exercise /socialization (video/audio) and outdoor time  [employer] new counseling ref- she declined due to financial concerns  Will follow  Enc her to call if side effects or problems       Hyperlipidemia    Tolerating generic crestor  Will schedule labs for lipid profile and ast/alt  Eating much healthier as well       Relevant Orders   Lipid panel   ALT   AST   Hypokalemia    3.1 in ED (unsure why-denies n/v/d)  Given 20 meq bid for 5 d  Will plan a lab draw for level      Relevant Orders   Basic metabolic panel      Follow Up Instructions: Our office will call you to set up a lab draw (hopefully at [facility])  Increase the doxepin to 50 mg daily for sleep and depression /anxiety as well as headaches  Talk to friends and family for support  If you become ready for counseling again please let me know    I discussed the assessment and treatment  plan with the patient. The patient was provided an opportunity to ask questions and all were answered. The patient agreed with the plan and demonstrated an understanding of the instructions.   The patient was advised to call back or seek an in-person evaluation if the symptoms worsen or if the condition fails to improve as anticipated.     Loura Pardon, MD

## 2019-02-26 NOTE — Assessment & Plan Note (Signed)
3.1 in ED (unsure why-denies n/v/d)  Given 20 meq bid for 5 d  Will plan a lab draw for level

## 2019-02-28 ENCOUNTER — Other Ambulatory Visit: Payer: BLUE CROSS/BLUE SHIELD

## 2019-02-28 ENCOUNTER — Other Ambulatory Visit: Payer: Self-pay

## 2019-03-01 ENCOUNTER — Other Ambulatory Visit (INDEPENDENT_AMBULATORY_CARE_PROVIDER_SITE_OTHER): Payer: BLUE CROSS/BLUE SHIELD

## 2019-03-01 DIAGNOSIS — E7800 Pure hypercholesterolemia, unspecified: Secondary | ICD-10-CM | POA: Diagnosis not present

## 2019-03-01 DIAGNOSIS — R7309 Other abnormal glucose: Secondary | ICD-10-CM | POA: Diagnosis not present

## 2019-03-01 DIAGNOSIS — E876 Hypokalemia: Secondary | ICD-10-CM

## 2019-03-01 LAB — BASIC METABOLIC PANEL
BUN: 16 mg/dL (ref 6–23)
CO2: 29 mEq/L (ref 19–32)
Calcium: 8.9 mg/dL (ref 8.4–10.5)
Chloride: 105 mEq/L (ref 96–112)
Creatinine, Ser: 0.73 mg/dL (ref 0.40–1.20)
GFR: 83.3 mL/min (ref >60.00)
Glucose, Bld: 106 mg/dL — ABNORMAL HIGH (ref 70–99)
Potassium: 3.8 mEq/L (ref 3.5–5.1)
Sodium: 141 mEq/L (ref 135–145)

## 2019-03-01 LAB — LIPID PANEL
Cholesterol: 167 mg/dL (ref 0–200)
HDL: 43.3 mg/dL (ref >39.00)
LDL Cholesterol: 96 mg/dL (ref 0–99)
NonHDL: 123.91
Total CHOL/HDL Ratio: 4
Triglycerides: 142 mg/dL (ref 0.0–149.0)
VLDL: 28.4 mg/dL (ref 0.0–40.0)

## 2019-03-01 LAB — HEMOGLOBIN A1C: Hgb A1c MFr Bld: 5.7 % (ref 4.6–6.5)

## 2019-03-01 LAB — ALT: ALT: 18 U/L (ref 0–35)

## 2019-03-01 LAB — AST: AST: 19 U/L (ref 0–37)

## 2019-03-04 ENCOUNTER — Other Ambulatory Visit: Payer: Self-pay | Admitting: Family Medicine

## 2019-03-04 MED ORDER — LISDEXAMFETAMINE DIMESYLATE 70 MG PO CAPS: 70.0000 mg | ORAL_CAPSULE | Freq: Every day | ORAL | 0 refills | Status: DC

## 2019-03-04 NOTE — Telephone Encounter (Signed)
See pt's mychart comment: "Patient comment: Can you please refill at [facility] based pharmacy since I'm not working. Is it possible to do 90 day refill on this? Thanks"  Name of Pharmacy:[facility] [address] Instead of her normal [facility] [place] Last Fill or Written Date and Quantity:# 30 with 0 refills on 02/01/19 (wants 26 day supply now) Last Office Visit and Type:02/26/19 f/u on Migraines Next Office Visit and Type:none scheduled

## 2019-03-04 NOTE — Telephone Encounter (Signed)
I cannot refill this for 90 day supply  Sent 30 d supply to [facility] pharmacy

## 2019-03-14 ENCOUNTER — Encounter: Payer: Self-pay | Admitting: Family Medicine

## 2019-03-15 ENCOUNTER — Other Ambulatory Visit: Payer: Self-pay | Admitting: Neurology

## 2019-03-19 ENCOUNTER — Encounter: Payer: Self-pay | Admitting: Family Medicine

## 2019-03-20 NOTE — Telephone Encounter (Signed)
Called pt to office an appt and she said she just went to the hospital on 02/25/19 and then had an appt with Dr. Glori Bickers on 02/26/19 to discuss this, she said part of the problem is she lost her job and doesn't have insurance so she is still trying to pay for the hospital and the last OV she just had with Dr. Glori Bickers on 02/26/19. Pt said she can't afford another appt here but she said the meds are not working she is just crying often and depressed and then feels angry. Pt said she was on the verge of going back to the hospital last night but she knows she can't afford another thousand dollar hospital bill so she didn't go but is asking Dr. Glori Bickers to help her by prescribing something different. Pt said she would like Dr. Glori Bickers to call her or she can send a mychart message and let her know what to do

## 2019-03-21 MED ORDER — ESCITALOPRAM OXALATE 20 MG PO TABS: ORAL_TABLET | ORAL | 3 refills | Status: DC

## 2019-04-02 ENCOUNTER — Encounter: Payer: Self-pay | Admitting: Family Medicine

## 2019-04-02 ENCOUNTER — Other Ambulatory Visit: Payer: Self-pay | Admitting: Family Medicine

## 2019-04-03 MED ORDER — LISDEXAMFETAMINE DIMESYLATE 70 MG PO CAPS: 70.0000 mg | ORAL_CAPSULE | Freq: Every day | ORAL | 0 refills | Status: DC

## 2019-04-03 NOTE — Telephone Encounter (Signed)
Name of Medication: vyvanse Name of Pharmacy: [facility] Last Fill or Written Date and Quantity: 03/04/19 #30 caps with 0 refills Last Office Visit and Type: f/u on migraines on 02/26/19 Next Office Visit and Type: none scheduled Last Controlled Substance Agreement Date: never done Last UDS: never done

## 2019-04-15 ENCOUNTER — Ambulatory Visit: Payer: Self-pay | Admitting: Neurology

## 2019-04-17 ENCOUNTER — Ambulatory Visit: Payer: BLUE CROSS/BLUE SHIELD | Admitting: Neurology

## 2019-04-19 ENCOUNTER — Other Ambulatory Visit: Payer: Self-pay | Admitting: Family Medicine

## 2019-04-20 ENCOUNTER — Other Ambulatory Visit: Payer: Self-pay | Admitting: Family Medicine

## 2019-04-20 ENCOUNTER — Encounter: Payer: Self-pay | Admitting: Family Medicine

## 2019-04-22 MED ORDER — ZOLPIDEM TARTRATE 10 MG PO TABS: ORAL_TABLET | ORAL | 0 refills | Status: DC

## 2019-04-22 NOTE — Telephone Encounter (Signed)
Already done in [place] encounter

## 2019-04-22 NOTE — Telephone Encounter (Signed)
Patient called stating that she requested this refill from her pharmacy Saturday and really needs this refilled today because she can not sleep without it.

## 2019-04-22 NOTE — Telephone Encounter (Signed)
Left VM giving the verbal ok from PCP to refill doxepin early

## 2019-04-22 NOTE — Telephone Encounter (Signed)
Name of Medication: Lorrin Mais [facility] Last Fill or Written Date and Quantity: 01/10/19 #90 tabs with 0 refills Last Office Visit and Type: ER f/u Migraines 02/26/19 Next Office Visit and Type: none scheduled Last Controlled Substance Agreement Date: n/a Last UDS: n/a

## 2019-04-22 NOTE — Telephone Encounter (Signed)
See pt's mychart message: Patient comment: Dr. Glori Bickers, I need my Lorrin Mais refilled please. Also, I have about 1 weeks supply of my escitalopram and my doxepin. Over the last month or so I took the Doxepin twice a day, in morning and then at night. It helped some with depression and anxiousness. I'm now back to just taking it at night. Because of this I've only got a weeks worth left. I also have about 1 week of the escitalopram left if you can go ahead and refill it. Good news, I have a job offer. If you can do 90 day supply please. Thanks!   lexapro and Lorrin Mais have been refilled but please advise on he doxepin, last filled on 02/26/19 #90 tabs with 3 refills, so since med is early ?? If you want to give approval to pharmacy to refill early

## 2019-04-22 NOTE — Telephone Encounter (Signed)
Her doxepin is early because she took it bid for a while She is back to regular dosing -please go ahead and refill  Thanks

## 2019-04-24 ENCOUNTER — Other Ambulatory Visit: Payer: Self-pay | Admitting: Family Medicine

## 2019-04-24 NOTE — Telephone Encounter (Signed)
See pt's mychart message on med: "Dr Glori Bickers, is it possible to have this one refilled on Sunday. It would be 5 days early. I start my new job on Monday and even with [employer] still over $350.00 Thanks."   Last filled on 04/03/19 #30 tabs with 0 refill

## 2019-04-24 NOTE — Telephone Encounter (Signed)
Please let her know that by law I cannot fill early due to controlled substance category

## 2019-04-25 ENCOUNTER — Other Ambulatory Visit: Payer: Self-pay | Admitting: Family Medicine

## 2019-04-25 MED ORDER — BUTALBITAL-APAP-CAFFEINE 50-325-40 MG PO TABS: 1.0000 | ORAL_TABLET | Freq: Two times a day (BID) | ORAL | 0 refills | Status: DC | PRN

## 2019-04-25 NOTE — Telephone Encounter (Signed)
Send this back to me on 7/15 when she is due and I will send it

## 2019-04-25 NOTE — Telephone Encounter (Signed)
Last filled on 01/24/19 #90 tabs with 0 refills

## 2019-04-25 NOTE — Telephone Encounter (Signed)
Mychart message was sent please see pt's response

## 2019-04-25 NOTE — Telephone Encounter (Signed)
Sent mychart message asking pt

## 2019-04-25 NOTE — Telephone Encounter (Signed)
adderall short acting twice daily  Is she open to it?

## 2019-04-30 NOTE — Telephone Encounter (Signed)
Routing back to PCP for refill on 05/01/19 (tomorrow)

## 2019-05-01 ENCOUNTER — Other Ambulatory Visit: Payer: Self-pay | Admitting: *Deleted

## 2019-05-01 MED ORDER — AMPHETAMINE-DEXTROAMPHETAMINE 30 MG PO TABS: 30.0000 mg | ORAL_TABLET | Freq: Two times a day (BID) | ORAL | 0 refills | Status: DC

## 2019-05-01 NOTE — Telephone Encounter (Signed)
I sent adderall for her to try -it is bid (while waiting for insurance to kick in) Prev on vyvanse

## 2019-05-01 NOTE — Addendum Note (Signed)
Addended by: Loura Pardon A on: 05/01/2019 08:35 AM   Modules accepted: Orders

## 2019-05-13 ENCOUNTER — Encounter: Payer: Self-pay | Admitting: Neurology

## 2019-05-13 ENCOUNTER — Ambulatory Visit (INDEPENDENT_AMBULATORY_CARE_PROVIDER_SITE_OTHER): Payer: BLUE CROSS/BLUE SHIELD | Admitting: Neurology

## 2019-05-13 DIAGNOSIS — R51 Headache: Secondary | ICD-10-CM

## 2019-05-14 NOTE — Progress Notes (Signed)
No show

## 2019-05-24 ENCOUNTER — Other Ambulatory Visit: Payer: Self-pay | Admitting: Neurology

## 2019-06-06 ENCOUNTER — Other Ambulatory Visit: Payer: Self-pay | Admitting: Family Medicine

## 2019-06-06 MED ORDER — AMPHETAMINE-DEXTROAMPHETAMINE 30 MG PO TABS: 30.0000 mg | ORAL_TABLET | Freq: Two times a day (BID) | ORAL | 0 refills | Status: DC

## 2019-06-06 NOTE — Telephone Encounter (Signed)
Name of Medication:Adderall 30 mg  Name of Pharmacy: [facility] Last Fill or Written Date and Quantity: # 5 on 05/01/19 Last Office Visit and Type: 02/26/19 acute Next Office Visit and Type: none scheduled

## 2019-06-27 ENCOUNTER — Encounter: Payer: Self-pay | Admitting: Family Medicine

## 2019-06-27 ENCOUNTER — Other Ambulatory Visit: Payer: Self-pay | Admitting: Family Medicine

## 2019-06-27 MED ORDER — BUTALBITAL-APAP-CAFFEINE 50-325-40 MG PO TABS: 1.0000 | ORAL_TABLET | Freq: Two times a day (BID) | ORAL | 0 refills | Status: DC | PRN

## 2019-07-16 ENCOUNTER — Other Ambulatory Visit: Payer: Self-pay | Admitting: Family Medicine

## 2019-07-16 ENCOUNTER — Other Ambulatory Visit: Payer: Self-pay

## 2019-07-16 MED ORDER — AMPHETAMINE-DEXTROAMPHETAMINE 30 MG PO TABS: 30.0000 mg | ORAL_TABLET | Freq: Two times a day (BID) | ORAL | 0 refills | Status: DC

## 2019-07-16 MED ORDER — ZOLPIDEM TARTRATE 10 MG PO TABS: ORAL_TABLET | ORAL | 0 refills | Status: DC

## 2019-07-16 NOTE — Telephone Encounter (Signed)
Pt's comment on Ambien from mychart says: RX runs out 10/4 (this Sunday) wanted to make sure it's called in so I can get it on time. Thank you!  Name of Medication: Ambien/ Adderall Name of Pharmacy: [facility] Last Fill or Written Date and Quantity: Ambien:04/22/19 #90 tabs 0 refills / Adderall 06/06/19 #30 tabs 0 refills Last Office Visit and Type: 02/26/19 F/U on migraines Next Office Visit and Type: none scheduled  Last Controlled Substance Agreement Date: not done Last UDS:not done

## 2019-07-17 ENCOUNTER — Other Ambulatory Visit: Payer: Self-pay

## 2019-07-17 MED ORDER — RIZATRIPTAN BENZOATE 10 MG PO TABS: ORAL_TABLET | ORAL | 5 refills | Status: DC

## 2019-08-06 ENCOUNTER — Other Ambulatory Visit: Payer: Self-pay | Admitting: Family Medicine

## 2019-08-06 MED ORDER — BUTALBITAL-APAP-CAFFEINE 50-325-40 MG PO TABS: 1.0000 | ORAL_TABLET | Freq: Two times a day (BID) | ORAL | 0 refills | Status: DC | PRN

## 2019-08-06 NOTE — Telephone Encounter (Signed)
I sent a short supply - I think this may be causing more rebound headaches and she needs to take less often if possible  I will forward this also to Dr Posey Pronto (neuro)   Encourage her to please get an appt there as soon as she can  Thanks

## 2019-08-06 NOTE — Telephone Encounter (Signed)
Also see pt's comments on refill request she wrote:   Patient Comment: Dr. Glori Bickers I'm in a lot of pain w/ neck. On days when it's really bad, I cannot turn my head all the way to the right. I cannot get comfortable to sleep. I've had rebound headaches a long time ago w/tramadol when they were wanting to do surgery on neck. I don't have any issues like that. I'm in a lot of pain and very uncomfortable. I will follow up w/ Dr. Posey Pronto as quick as I have insurance in effect this month. Thank you!    Last filled on 06/27/19 #60 tabs with 0 refills

## 2019-08-07 NOTE — Telephone Encounter (Signed)
Patient advised and verbalized understanding 

## 2019-08-13 ENCOUNTER — Telehealth: Payer: Self-pay | Admitting: *Deleted

## 2019-08-13 ENCOUNTER — Encounter: Payer: Self-pay | Admitting: Family Medicine

## 2019-08-13 DIAGNOSIS — G43009 Migraine without aura, not intractable, without status migrainosus: Secondary | ICD-10-CM

## 2019-08-13 DIAGNOSIS — R519 Headache, unspecified: Secondary | ICD-10-CM

## 2019-08-13 MED ORDER — LISDEXAMFETAMINE DIMESYLATE 70 MG PO CAPS: 70.0000 mg | ORAL_CAPSULE | Freq: Every day | ORAL | 0 refills | Status: DC

## 2019-08-13 MED ORDER — AIMOVIG 140 MG/ML ~~LOC~~ SOAJ: 140.0000 mg | SUBCUTANEOUS | 11 refills | Status: DC

## 2019-08-13 NOTE — Telephone Encounter (Signed)
Per MD ok to refill Aimovig 140 mg with 11 refills. Called patient and she prefers it be sent to [facility] [address].  She has been on this medication but had different insurance so PA needs to be done with current insurance.   Georgeanna Lea - please start PA for this patient. Thank you.   See her patient My chart message below:  " I finally have my insurance card. I'm going to call your office to set up a follow up appointment. I wanted to get my aimovig refilled but they require a prior authorization. I've attached copies of my card if this can be done. I'm having a lot of pain/neck/head right now.  I was taking the aimovig 140 mg until I lost my insurance about 3 months ago. Please call me if you need anything else in the meantime. It is really reasonable through [facility] or [employer] [address]/[place]).  Thanks! Daneli Musacchio [id number] "   Also patient has already called and made follow up appt.

## 2019-08-14 ENCOUNTER — Telehealth: Payer: Self-pay | Admitting: *Deleted

## 2019-08-14 NOTE — Telephone Encounter (Signed)
Followed up with patient regarding Aimovig. The script has been sent and the pharmacy will run it and see if PA is needed. If so they will initiate (not our office). Patient aware of above.

## 2019-08-19 ENCOUNTER — Other Ambulatory Visit: Payer: Self-pay

## 2019-08-19 ENCOUNTER — Ambulatory Visit: Payer: Commercial Managed Care - PPO | Admitting: Neurology

## 2019-08-19 ENCOUNTER — Encounter: Payer: Self-pay | Admitting: Neurology

## 2019-08-19 VITALS — BP 140/92 | HR 74 | Ht 63.0 in | Wt 144.0 lb

## 2019-08-19 DIAGNOSIS — M47812 Spondylosis without myelopathy or radiculopathy, cervical region: Secondary | ICD-10-CM

## 2019-08-19 DIAGNOSIS — G43709 Chronic migraine without aura, not intractable, without status migrainosus: Secondary | ICD-10-CM

## 2019-08-19 DIAGNOSIS — R292 Abnormal reflex: Secondary | ICD-10-CM

## 2019-08-19 NOTE — Progress Notes (Signed)
Follow-up Visit   Date: 08/19/19    Laurie [name] MRN: [id number] DOB: [birth year]   Interim History: Laurie [name] is a 53 y.o. right-handed Caucasian female with migraines, anxiety, IBS, and insomnia returning to the clinic for follow-up of chronic daily headaches.  The patient was accompanied to the clinic by self.  She was last seen in the office in December 2019 for chronic daily headaches at which time Aimovig was increased to 140 mg every 28 days.  Due to loss of job and insurance, she did not come for her scheduled follow-up visit.  She recently was reemployed and has health insurance benefits back.  She did lose coverage for Aimovig for months, which intensified her headaches, occurring almost daily.  She takes Fioricet for these.    She also complains of neck pain and popping noise with rotation, which has been ongoing for the past 3 months.  MRI from 2008 showed disc herniation worse at at C4-5, C5-6 and C6-7 (mild), which does not cause cord compression or nerve impingement.   She was previously evaluated by Dr. Nelva Bush and recommended surgery, which she did not follow through.  She has a lot of personal stressors and feels as if symptoms are worse as well.   Medications:  Current Outpatient Medications on File Prior to Visit  Medication Sig Dispense Refill  . baclofen (LIORESAL) 10 MG tablet TAKE 1 TABLET BY MOUTH AT BEDTIME AS NEEDED FOR MUSCLE SPASMS (Patient taking differently: Take 10 mg by mouth 2 (two) times daily as needed for muscle spasms. ) 90 tablet 1  . butalbital-acetaminophen-caffeine (FIORICET) 50-325-40 MG tablet Take 1 tablet by mouth 2 (two) times daily as needed for headache. 30 tablet 0  . doxepin (SINEQUAN) 50 MG capsule Take 1 capsule (50 mg total) by mouth at bedtime. 90 capsule 3  . Erenumab-aooe (AIMOVIG) 140 MG/ML SOAJ Inject 140 mg into the skin every 30 (thirty) days. 1.12 mg 11  . escitalopram (LEXAPRO) 20 MG tablet TAKE 1 TABLET BY MOUTH EVERY  DAY 90 tablet 0  . lisdexamfetamine (VYVANSE) 70 MG capsule Take 1 capsule (70 mg total) by mouth daily. 30 capsule 0  . rizatriptan (MAXALT) 10 MG tablet TAKE ONE TABLET BY MOUTH AS NEEDED FOR MIGRAINE. MAY REPEAT IN 2 HOURS IF NEEDED. APPOINTMENT NEEDED. 10 tablet 5  . rosuvastatin (CRESTOR) 10 MG tablet Take 1 tablet (10 mg total) by mouth daily. 90 tablet 1  . zolpidem (AMBIEN) 10 MG tablet TAKE 1 TABLET(10 MG) BY MOUTH AT BEDTIME AS NEEDED 90 tablet 0  . potassium chloride SA (K-DUR) 20 MEQ tablet Take 1 tablet (20 mEq total) by mouth 2 (two) times daily for 5 days. 10 tablet 0   No current facility-administered medications on file prior to visit.     Allergies:  Allergies  Allergen Reactions  . Codeine     REACTION: vomiting  . Codeine Phosphate     REACTION: vomiting  . Penicillins     REACTION: urticaria (hives) Did it involve swelling of the face/tongue/throat, SOB, or low BP? No Did it involve sudden or severe rash/hives, skin peeling, or any reaction on the inside of your mouth or nose? No Did you need to seek medical attention at a hospital or doctor's office? No When did it last happen? If all above answers are "NO", may proceed with cephalosporin use.     Review of Systems:  CONSTITUTIONAL: No fevers, chills, night sweats, or weight loss.  EYES: No visual  changes or eye pain ENT: No hearing changes.  No history of nose bleeds.   RESPIRATORY: No cough, wheezing and shortness of breath.   CARDIOVASCULAR: Negative for chest pain, and palpitations.   GI: +for abdominal discomfort, blood in stools or black stools.  No recent change in bowel habits.   GU:  No history of incontinence.   MUSCLOSKELETAL: No history of joint pain or swelling.  No myalgias.   SKIN: Negative for lesions, rash, and itching.   ENDOCRINE: Negative for cold or heat intolerance, polydipsia or goiter.   PSYCH:  No depression +anxiety symptoms.   NEURO: As Above.   Vital Signs:  BP (!)  140/92   Pulse 74   Ht 5\' 3"  (1.6 m)   Wt 144 lb (65.3 kg)   LMP  (LMP Unknown)   SpO2 98%   BMI 25.51 kg/m   Neurological Exam: MENTAL STATUS including orientation to time, place, person, recent and remote memory, attention span and concentration, language, and fund of knowledge is normal.  Speech is not dysarthric.  CRANIAL NERVES:  Pupils equal round and reactive to light.  Normal conjugate, extra-ocular eye movements in all directions of gaze.  No ptosis.  MOTOR:  Motor strength is 5/5 in all extremities.  Neck range of motion is reduced in all directions due to pain.  No atrophy, fasciculations or abnormal movements.  No pronator drift.  Tone is normal.    MSRs:  Reflexes are 3+/4 throughout, and 2+/4 at the ankles.  SENSORY:  Intact to vibration and light touch throughout.    COORDINATION/GAIT:  Normal finger-to- nose-finger.  Intact rapid alternating movements bilaterally.  Gait narrow based and stable.   Data: CT head 02/10/2014:  Negative MRI cervical spine wo contrast 02/05/2007: 1. C4-5, shallow broad base disc herniation which effaces the ventral subarachnoid space but does not compress the cord or show foraminal extension. No progressive change since 2005.   2. C5-6, spondylosis and protruding disc material, narrowing the ventral subarachnoid space. Foraminal encroachment on the right because of osteophytes. The right C6 nerve root could be effected. No progressive change since the previous exam.   3. C6-7, shallow disc protrusion without apparent neural compression. No progressive change.  IMPRESSION: 1.  Chronic daily headaches, previously well controlled on Aimovig 140mg  monthly injection, however due to lapse in insurance this need prior authorization.  I will have my office follow-up with this.   She has previously tried:  Amitriptyline 20mg  (improved headaches, but stopped due to cognitive side effects), TPM (GI upset), Lyrica, nortriptyline (effective, sedation),  Cymbalta (effective, weight gain). She continues to take Fioricet regularly, despite strongly advising against this due to risk of medication overuse headaches  2.  Cervical spondylosis.  Exam shows brisk reflexes which can suggest cervical canal stenosis.  Proceed with MRI cervical spine without contrast to look for compressive pathology.  Neck physiotherapy was declined.  Continue baclofen 10 mg twice daily.  Return to clinic in 4 months    Greater than 50% of this 25 minute visit was spent in counseling, explanation of diagnosis, planning of further management, and coordination of care.  Thank you for allowing me to participate in patient's care.  If I can answer any additional questions, I would be pleased to do so.    Sincerely,    Donika K. Posey Pronto, DO

## 2019-08-19 NOTE — Patient Instructions (Addendum)
MRI cervical spine without contrast We have sent a referral to [facility] for your MRI and they will call you directly to schedule your appointment. They are located at [place]. If you need to contact them directly please call [phone]. We will follow-up with the authorization for Aimovig  Return to clinic in 4 months

## 2019-08-23 ENCOUNTER — Other Ambulatory Visit: Payer: Self-pay | Admitting: Family Medicine

## 2019-08-23 ENCOUNTER — Encounter: Payer: Self-pay | Admitting: Family Medicine

## 2019-08-23 MED ORDER — BUTALBITAL-APAP-CAFFEINE 50-325-40 MG PO TABS: 1.0000 | ORAL_TABLET | Freq: Two times a day (BID) | ORAL | 0 refills | Status: DC | PRN

## 2019-08-23 NOTE — Telephone Encounter (Signed)
Neurology really wants her to wean this - so she does not produce more rebound headaches  I will send in 15 pills Please try to take less often (daily at most) and let us know how it goes Then we can eventually work on getting off of it I will be watching out for the imaging study

## 2019-08-23 NOTE — Telephone Encounter (Signed)
See mychart message. Last filled on 08/06/19 #30 tabs with 0 refills

## 2019-08-24 ENCOUNTER — Ambulatory Visit
Admission: RE | Admit: 2019-08-24 | Discharge: 2019-08-24 | Disposition: A | Discharge status: Home / Self Care | Payer: Commercial Managed Care - PPO | Source: Ambulatory Visit | Attending: Neurology | Admitting: Neurology

## 2019-08-24 ENCOUNTER — Other Ambulatory Visit: Payer: Self-pay

## 2019-08-24 DIAGNOSIS — M47812 Spondylosis without myelopathy or radiculopathy, cervical region: Secondary | ICD-10-CM

## 2019-08-26 ENCOUNTER — Telehealth: Payer: Self-pay | Admitting: Neurology

## 2019-08-26 ENCOUNTER — Other Ambulatory Visit: Payer: Self-pay

## 2019-08-26 ENCOUNTER — Telehealth: Payer: Self-pay

## 2019-08-26 ENCOUNTER — Encounter: Payer: Self-pay | Admitting: *Deleted

## 2019-08-26 DIAGNOSIS — M47812 Spondylosis without myelopathy or radiculopathy, cervical region: Secondary | ICD-10-CM

## 2019-08-26 NOTE — Telephone Encounter (Signed)
I notified patient that results have not been released yet and as soon as they were, I would call her.

## 2019-08-26 NOTE — Telephone Encounter (Signed)
Patient called regarding her having an MRI this past weekend and she is needing to see if the results can be faxed to her at [phone]. She said she is hurting and would like to see her results. Thank you

## 2019-08-26 NOTE — Telephone Encounter (Signed)
I advised patient of MRI results and faxed referral and sent demographics.

## 2019-08-26 NOTE — Progress Notes (Signed)
New Prior Authorization Check Status    Terilyn Kasprzak    AIMOVIG 140 MG/ML AUTOINJECTOR     Narda Amber Thank you for creating a prior authorization request using PromptPA. The prior authorization department will contact you if additional information is needed. Once a decision is made you will be notified of the decision. You can check the status of your request using the Check Status link. Please note down (Prior Auth EOC ID) to check status. Prior Authorization request details: Prior Auth (EOC) ID: [id number] Drug/Service Name: AIMOVIG 140 MG/ML AUTOINJECTOR [name] Date Requested: 08/26/2019 11:25:24 AM   MemberID: [id number] DOB: 1965-12-29

## 2019-08-26 NOTE — Progress Notes (Signed)
Calton Golds (Key: [id number]) Rx #: [id number] Aimovig 140MG /ML auto-injectors   Form [facility] Electronic Prior Authorization Form (2017 NCPDP) Created 11 days ago Sent to Plan 7 days ago Plan Response 7 days ago Submit Clinical Questions Determination N/A Message from Plan [facility] does not handle this review. Please visit [email or web address] to start a prior authorization or fax information to [phone]. Please include all supporting chart notes. You may contact [employer] at [phone].  The previous office note is where the PA was completed

## 2019-08-27 ENCOUNTER — Other Ambulatory Visit: Payer: Self-pay

## 2019-08-27 MED ORDER — GABAPENTIN 300 MG PO CAPS: 300.0000 mg | ORAL_CAPSULE | Freq: Every day | ORAL | 0 refills | Status: DC

## 2019-08-27 MED ORDER — GABAPENTIN 300 MG PO CAPS: 300.0000 mg | ORAL_CAPSULE | Freq: Every day | ORAL | 1 refills | Status: DC

## 2019-08-27 NOTE — Telephone Encounter (Signed)
-----   Message from Reather Laurence, LPN sent at [id number]  3:04 PM EST ----- Notified patient of results pt is asking for pain medication, until she can get an appt. Will call her back

## 2019-08-27 NOTE — Telephone Encounter (Signed)
We can start low dose gabapentin 300mg  at bedtime for pain.  If she is agreeable, please send Rx 30-day supply.

## 2019-08-27 NOTE — Addendum Note (Signed)
Addended by: Alda Berthold on: 08/27/2019 09:55 AM   Modules accepted: Orders

## 2019-08-27 NOTE — Telephone Encounter (Signed)
Pt notified and Rx sent and signed by Dr.Patel. [facility]

## 2019-08-27 NOTE — Telephone Encounter (Signed)
No voicemail set up at [phone], will call back

## 2019-08-28 NOTE — Progress Notes (Signed)
Checked on status 08/28/2019 Drug/Service Name: AIMOVIG 140 MG/ML AUTOINJECTOR Physician/Nurse: Narda Amber EOC ID: [id number]  Status: In Progress Date Requested: 08/26/2019 11:25:24 Date Closed:  Dispensing Location:

## 2019-08-29 ENCOUNTER — Encounter: Payer: Self-pay | Admitting: Neurology

## 2019-08-29 NOTE — Progress Notes (Signed)
Appeal Letter sent 08/29/2019, waiting for response

## 2019-09-02 NOTE — Progress Notes (Signed)
Laurie [name] (Key: [id number])  Rx #: [id number]  Aimovig 140MG /ML auto-injectors  prior authorization submitted via covermymeds.  Waiting response.

## 2019-09-02 NOTE — Progress Notes (Signed)
[employer]mymeds and [facility] do not process aimovig for this patient.  New prior auth initiated at [email or web address]

## 2019-09-03 ENCOUNTER — Telehealth: Payer: Self-pay

## 2019-09-03 NOTE — Telephone Encounter (Signed)
PA for Vyvanse has been submitted through [facility] Certification Portal. I will await determination.

## 2019-09-03 NOTE — Telephone Encounter (Signed)
Error

## 2019-09-05 NOTE — Telephone Encounter (Addendum)
PA said it was denied. The letter said:  The medication can only be approved if there is a diagnosis of ADHD and pt is 19 yrs or older and sxs been present prior to 53 years of age and sxs interfer with and reduce the quality of academic or occupational functioning in the pt medical record."  I see in pt's chart (that was scanned on 02/26/18) a Psychological eval that does state that sxs have been present since before age 32. I can fax this to her insurance to try to appeal their decision but I would also need a letter from PCP requesting an appeal of the denial decision   Letter placed in Dr. Marliss Coots inbox for review along with the eval showing where it does state the prev info (highlighted)

## 2019-09-06 NOTE — Telephone Encounter (Signed)
Agree- she needs to touch base with orthopedics Thanks

## 2019-09-06 NOTE — Telephone Encounter (Signed)
Called pt an advise of the denial and that we will appeal and she did give the the verbal ok to send any related documents with the letter to her insurance.  Pt wanted me to update Dr. Glori Bickers on her pain. She said that the neurologist did an MRI and given the results they referred her to Ortho (MRI in Epic), pt said her insurance is making them do a prior auth on "everything" pt said that the ortho doc recommended her getting injections but the have to do a PA on the injections and also Aimovig, pt said that she is in so much pain given her neck/back problems that she cried all night. Pt said that she is ready to start treatment on her back/neck issues but her insurance is "holding up the process". Pt said she is in so much pain that she didn't know what to do she is trying to work and is in tears and wanted to know what Dr. Glori Bickers recommended. She wanted to see if Dr. Glori Bickers could prescribe her something but then right after she told me that the ortho did give her 5mg  of hydrocodone to help with the pain until they could get everything else approved but the hydrocodone isn't helping. I advise pt that due to the STOP act that she can only have 1 provider prescribing her pain meds and since her Ortho doc gave her hydrocodone that she needs to let them know it's not helping and see what they recommended especially since they are the ones treating her neck/back pain. Pt verbalized understanding and she will f/u with Ortho and let them know what's going on, FYI to Dr. Glori Bickers

## 2019-09-06 NOTE — Telephone Encounter (Signed)
Please send records if she is ok with it  I printed letter-in IN box

## 2019-09-20 MED ORDER — LISDEXAMFETAMINE DIMESYLATE 70 MG PO CAPS: 70.0000 mg | ORAL_CAPSULE | Freq: Every day | ORAL | 0 refills | Status: DC

## 2019-09-20 NOTE — Telephone Encounter (Addendum)
Appeal was approved, Laurie [name] notified and pharmacy aware. Fax placed in Dr. Marliss Coots inbox to sign and send for scanning.   Laurie [name] is requesting Dr. Glori Bickers refill med since it's approved now. [facility] [address] Last refilled on 08/13/19 #30 caps with 0 refills, last OV 02/26/19 ER f/u

## 2019-09-20 NOTE — Addendum Note (Signed)
Addended by: Tammi Sou on: 09/20/2019 03:05 PM   Modules accepted: Orders

## 2019-09-20 NOTE — Addendum Note (Signed)
Addended by: Loura Pardon A on: 09/20/2019 03:09 PM   Modules accepted: Orders

## 2019-10-07 ENCOUNTER — Other Ambulatory Visit: Payer: Self-pay | Admitting: Family Medicine

## 2019-10-07 NOTE — Telephone Encounter (Signed)
This is too early I think, by about a week

## 2019-10-07 NOTE — Telephone Encounter (Signed)
Name of Medication: Ambien Name of Pharmacy: [facility] Last Fill or Written Date and Quantity: Ambien:07/16/19 #90 tabs 0 refills  Last Office Visit and Type: 02/26/19 F/U on migraines Next Office Visit and Type: none scheduled  Last Controlled Substance Agreement Date: not done Last UDS:not done

## 2019-10-13 ENCOUNTER — Other Ambulatory Visit: Payer: Self-pay | Admitting: Family Medicine

## 2019-10-14 MED ORDER — ZOLPIDEM TARTRATE 10 MG PO TABS: ORAL_TABLET | ORAL | 0 refills | Status: DC

## 2019-10-14 NOTE — Telephone Encounter (Signed)
Name of Medication: Ambien Name of Pharmacy: [facility] Last Fill or Written Date and Quantity: 07/16/19 #90 tabs 0 refills  Last Office Visit and Type: 02/26/19 F/U on migraines Next Office Visit and Type: none scheduled  Last Controlled Substance Agreement Date: not done Last UDS:not done

## 2019-10-14 NOTE — Telephone Encounter (Signed)
Electronic refill request. Zolpidem Last office visit:

## 2019-10-21 ENCOUNTER — Encounter: Payer: Self-pay | Admitting: Family Medicine

## 2019-10-21 NOTE — Telephone Encounter (Signed)
Dr. Glori Bickers sent me a message saying:   I want to see her this week. I prefer an in office visit if possible so we can use your own bp equip and examine her (due to symptoms) but if that is impossible make it virtual  Monitor BP when very relaxed  Drink lots of water/ avoid excess salt or caffeine  If severe headache or systolic bp above [id number] go to the ER  Please schedule  Thanks

## 2019-10-21 NOTE — Telephone Encounter (Signed)
Pt was seen at Dr Serita Grit office and ortho and BP 160/130 on 10/05/19; pt did have migraine at that time. Rechecked BP 10/18/18 180/130 and at beginning of this call 166/107 after rest 159/108 P78. Pt stopped vyvanse few wks ago so as not to interfere with other meds. Pt is not on BP medication. At times pt has had blurred vision and H/A and when leans head over feels like pt will pass out. Pt is feeling tired today. No other symptoms today. Pt has missed a lot of work and wants to know if can do virtual visit instead of in office visit. Pt request cb. Pt does not have covid symptoms except H/A and tired. UC & ED precautions given and pt voiced understanding.

## 2019-10-21 NOTE — Telephone Encounter (Signed)
Called pt and advise her of Dr. Marliss Coots comments and recommendations. Pt said there is no way she can come in for an appt. She lives in [place] and she would either have to come in late to work or leave early and she can't afford to do that without the possibility of loosing her job. Pt requested a virtual appt to talk to Dr. Glori Bickers and then when she is able she will schedule an in office appt. Pt can get her vitals at home so Doxy/virtual appt scheduled tomorrow at 12pm  FYI to PCP

## 2019-10-22 ENCOUNTER — Ambulatory Visit (INDEPENDENT_AMBULATORY_CARE_PROVIDER_SITE_OTHER): Payer: Commercial Managed Care - PPO | Admitting: Family Medicine

## 2019-10-22 ENCOUNTER — Encounter: Payer: Self-pay | Admitting: Family Medicine

## 2019-10-22 ENCOUNTER — Other Ambulatory Visit: Payer: Self-pay

## 2019-10-22 VITALS — BP 148/103 | HR 78

## 2019-10-22 DIAGNOSIS — I10 Essential (primary) hypertension: Secondary | ICD-10-CM | POA: Diagnosis not present

## 2019-10-22 DIAGNOSIS — G43709 Chronic migraine without aura, not intractable, without status migrainosus: Secondary | ICD-10-CM

## 2019-10-22 DIAGNOSIS — F902 Attention-deficit hyperactivity disorder, combined type: Secondary | ICD-10-CM

## 2019-10-22 DIAGNOSIS — F430 Acute stress reaction: Secondary | ICD-10-CM

## 2019-10-22 DIAGNOSIS — M4802 Spinal stenosis, cervical region: Secondary | ICD-10-CM | POA: Diagnosis not present

## 2019-10-22 DIAGNOSIS — E7800 Pure hypercholesterolemia, unspecified: Secondary | ICD-10-CM

## 2019-10-22 HISTORY — DX: Essential (primary) hypertension: I10

## 2019-10-22 MED ORDER — METOPROLOL SUCCINATE ER 50 MG PO TB24: 50.0000 mg | ORAL_TABLET | Freq: Every day | ORAL | 1 refills | Status: DC

## 2019-10-22 NOTE — Assessment & Plan Note (Signed)
This may be hereditary and then worsened with pain and anxiety  Px metoprolol xl 50 mg daily  Disc poss side eff incl low bp or pulse-inst to call if problems Update with bp at home later this week  F/u 1 mo in person (stressed imp of this)  Enc healthy lifestyle / avoid excess sodium  Continue tx for chronic pain issues

## 2019-10-22 NOTE — Assessment & Plan Note (Signed)
Continues to suffer from anxiety Stopped doxepin for a night- may try again  Cannot afford counseling Severe stressors-work and chronic pain and break up  Reviewed stressors/ coping techniques/symptoms/ support sources/ tx options and side effects in detail today  Continues lexapro and prn ambien  Also doxepin

## 2019-10-22 NOTE — Patient Instructions (Addendum)
Start the metoprolol XL 50 mg once daily  If any problems or side effects (like low BP or pulse) let me know Update me later in the week with how you are doing   Follow up for an in person visit in about a month  If at any point you are ready for a counseling referral please let me know   Take care of yourself  Stick to a low cholesterol diet

## 2019-10-22 NOTE — Progress Notes (Signed)
Virtual Visit via Video Note  I connected with Laurie [name] on 10/22/19 at 12:00 PM EST by a video enabled telemedicine application and verified that I am speaking with the correct person using two identifiers.  Location: Patient: home Provider: office    I discussed the limitations of evaluation and management by telemedicine and the availability of in person appointments. The patient expressed understanding and agreed to proceed.  Parties involved in encounter  [place]  Provider:  Loura Pardon MD    History of Present Illness: Pt presents with elevated BP  Pt states she was seen in Dr Serita Grit office and bp was 16/130 on 10/05/19 She did have a migraine then  Re checked on 1/2 and it was 180/130 Yesterday 166/107 and then 159/108 with pulse of 78 She stopped vyvanse a few weeks ago  (for bp)  Has had headache (migraine) Blurred vision  Feels faint when she bends head forward Feeling tired a lot    Today bp is 148/103 BP Readings from Last 3 Encounters:  10/22/19 (!) 148/103  08/19/19 (!) 140/92  02/25/19 133/89   Thinks her cuff is pretty accurate  Pulse Readings from Last 3 Encounters:  10/22/19 78  08/19/19 74  02/25/19 72     bp tends to go up with her migraines  Had cervical injection -it was up then as well  (severe pain from her neck/cervical stenosis)  Has another injection planned on Monday   Almost had a fusion in the past- got frightened and backed out  May have to consider surgery in the future  Taking oxycodone for the pain in her neck (cannot function without it)- taking it 3-4 times daily depending    Has zanaflex-helps some also - helps headache  Could not get aimovig approved -still working on that  maxalt takes only if severe headache    CAD runs in the family  HTN as well  Her cholesterol last  Lab Results  Component Value Date   CHOL 167 03/01/2019   HDL 43.30 03/01/2019   [place] 96 03/01/2019   TRIG 142.0  03/01/2019   CHOLHDL 4 03/01/2019  has had heart checked out in the past  Used to take crestor- stopped it due to muscle pain    She stopped doxepin as well (? If affects her bp)   Psychologic status -still struggling with anxiety and depression   Had a break up  Job is very stressful   (has had to take time off for multiple visits)   Patient Active Problem List   Diagnosis Date Noted  . Hypertension 10/22/2019  . Hypokalemia 02/26/2019  . Chronic migraine w/o aura w/o status migrainosus, not intractable 02/26/2019  . Binge eating 02/01/2019  . Elevated glucose 06/10/2018  . Hyperlipidemia 06/07/2018  . ADHD (attention deficit hyperactivity disorder), combined type 04/17/2018  . Lateral epicondylitis 04/17/2018  . Menopause syndrome 04/17/2018  . Poor concentration 03/06/2018  . IBS (irritable bowel syndrome) 09/04/2017  . Chest pain in adult 01/02/2017  . Adjustment disorder with mixed anxiety and depressed mood 05/25/2016  . Breast lump in female 03/04/2015  . Pelvic pain in female 03/04/2015  . Frequent urination 03/04/2015  . Low back pain 03/04/2015  . Vitamin D deficiency 03/04/2015  . Chronic daily headache 02/19/2014  . Fatigue 04/27/2012  . Ovarian cyst 04/27/2012  . Insomnia 06/06/2007  . [place] DISEASE 06/05/2007  . Stress reaction 04/04/2007  . Cervical stenosis of spine 04/04/2007  . Anxiety 04/04/2007  Past Medical History:  Diagnosis Date  . Abnormal uterine bleeding   . Anemia   . Anxiety    situational  . Chronic neck pain    from disc dz  . Dysmenorrhea   . Endometriosis   . Fibroid   . IBS (irritable bowel syndrome)    diarrhea predominant  . Insomnia    organic sleep disorder, failed multiple meds  . Migraine   . Urinary incontinence    Past Surgical History:  Procedure Laterality Date  . ABDOMINAL HYSTERECTOMY    . ABDOMINAL HYSTERECTOMY    . BREAST BIOPSY Right   . BREAST CYST ASPIRATION Right   . Deg C-S disk/spinal  stenosis  06/2007  . DILATION AND CURETTAGE OF UTERUS    . ENDOMETRIAL ABLATION    . epidural injections    . KNEE SURGERY    . PELVIC LAPAROSCOPY    . TUBAL LIGATION     Social History   Tobacco Use  . Smoking status: Never Smoker  . Smokeless tobacco: Never Used  Substance Use Topics  . Alcohol use: Yes    Alcohol/week: 0.0 standard drinks    Comment: occ/rare  . Drug use: No   Family History  Problem Relation Age of Onset  . Heart disease Other        CAD  . Heart disease Other        massive MI  . COPD Father        Deceased, 62  . Heart disease Father        CAD  . Migraines Father   . Heart attack Father   . Migraines Brother   . Thyroid disease Mother        Living, 61  . Migraines Daughter   . Breast cancer Maternal Grandmother   . Heart attack Maternal Grandfather   . Heart disease Paternal Grandmother    Allergies  Allergen Reactions  . Latex Rash  . Codeine     REACTION: vomiting  . Codeine Phosphate     REACTION: vomiting  . Penicillins     REACTION: urticaria (hives) Did it involve swelling of the face/tongue/throat, SOB, or low BP? No Did it involve sudden or severe rash/hives, skin peeling, or any reaction on the inside of your mouth or nose? No Did you need to seek medical attention at a hospital or doctor's office? No When did it last happen? If all above answers are "NO", may proceed with cephalosporin use.   . Statins     Muscle pain    Current Outpatient Medications on File Prior to Visit  Medication Sig Dispense Refill  . doxepin (SINEQUAN) 50 MG capsule Take 1 capsule (50 mg total) by mouth at bedtime. 90 capsule 3  . Erenumab-aooe (AIMOVIG) 140 MG/ML SOAJ Inject 140 mg into the skin every 30 (thirty) days. 1.12 mg 11  . escitalopram (LEXAPRO) 20 MG tablet TAKE 1 TABLET BY MOUTH EVERY DAY 90 tablet 0  . lisdexamfetamine (VYVANSE) 70 MG capsule Take 1 capsule (70 mg total) by mouth daily. 30 capsule 0  .  oxyCODONE-acetaminophen (PERCOCET) 10-325 MG tablet TAKE 1 TABLET BY MOUTH 4 A DAY AS NEEDED    . rizatriptan (MAXALT) 10 MG tablet TAKE ONE TABLET BY MOUTH AS NEEDED FOR MIGRAINE. MAY REPEAT IN 2 HOURS IF NEEDED. APPOINTMENT NEEDED. 10 tablet 5  . tiZANidine (ZANAFLEX) 4 MG tablet Take 4 mg by mouth 3 (three) times daily as needed.    . zolpidem (  AMBIEN) 10 MG tablet TAKE 1 TABLET(10 MG) BY MOUTH AT BEDTIME AS NEEDED 90 tablet 0   No current facility-administered medications on file prior to visit.     Review of Systems  Constitutional: Positive for malaise/fatigue. Negative for chills and fever.  HENT: Negative for congestion, ear pain, sinus pain and sore throat.   Eyes: Positive for blurred vision. Negative for double vision, photophobia, discharge and redness.  Respiratory: Negative for cough, shortness of breath, wheezing and stridor.   Cardiovascular: Negative for chest pain, palpitations and leg swelling.  Gastrointestinal: Negative for abdominal pain, diarrhea, nausea and vomiting.  Musculoskeletal: Negative for myalgias.  Skin: Negative for rash.  Neurological: Positive for dizziness and headaches.  Psychiatric/Behavioral: Positive for depression. Negative for suicidal ideas. The patient is nervous/anxious and has insomnia.     Observations/Objective: Patient appears well, in no distress  Weight is baseline  No facial swelling or asymmetry Normal voice-not hoarse and no slurred speech No obvious tremor or mobility impairment Moving neck and UEs normally Able to hear the call well  No cough or shortness of breath during interview  Talkative and mentally sharp with no cognitive changes No skin changes on face or neck , no rash or pallor Affect is anxious/ sometimes tearful  She voices dep/anx mood and candidly discusses stressors and barriers to care    Assessment and Plan: Problem List Items Addressed This Visit      Cardiovascular and Mediastinum   Chronic migraine w/o  aura w/o status migrainosus, not intractable    Seeing neurology Also having cervical stenosis addressed  aimovig was denied by her ins-she is working on this        Relevant Medications   tiZANidine (ZANAFLEX) 4 MG tablet   oxyCODONE-acetaminophen (PERCOCET) 10-325 MG tablet   metoprolol succinate (TOPROL XL) 50 MG 24 hr tablet   Hypertension - Primary    This may be hereditary and then worsened with pain and anxiety  Px metoprolol xl 50 mg daily  Disc poss side eff incl low bp or pulse-inst to call if problems Update with bp at home later this week  F/u 1 mo in person (stressed imp of this)  Enc healthy lifestyle / avoid excess sodium  Continue tx for chronic pain issues      Relevant Medications   metoprolol succinate (TOPROL XL) 50 MG 24 hr tablet     Other   Stress reaction    Continues to suffer from anxiety Stopped doxepin for a night- may try again  Cannot afford counseling Severe stressors-work and chronic pain and break up  Reviewed stressors/ coping techniques/symptoms/ support sources/ tx options and side effects in detail today  Continues lexapro and prn ambien  Also doxepin      Cervical stenosis of spine    Cervical stenosis  Undergoing injections currently  Also taking oxycodone 3-4 times daily  May ultimately need a fusion  She will continue to f/u with specialist      ADHD (attention deficit hyperactivity disorder), combined type    Currently holding vyvanse for elevated BP      Hyperlipidemia    Pt stopped crestor due to muscle pain       Relevant Medications   metoprolol succinate (TOPROL XL) 50 MG 24 hr tablet       Follow Up Instructions: Start the metoprolol XL 50 mg once daily  If any problems or side effects (like low BP or pulse) let me know Update me later in  the week with how you are doing   Follow up for an in person visit in about a month  If at any point you are ready for a counseling referral please let me know    I  discussed the assessment and treatment plan with the patient. The patient was provided an opportunity to ask questions and all were answered. The patient agreed with the plan and demonstrated an understanding of the instructions.   The patient was advised to call back or seek an in-person evaluation if the symptoms worsen or if the condition fails to improve as anticipated.     Loura Pardon, MD

## 2019-10-22 NOTE — Assessment & Plan Note (Signed)
Cervical stenosis  Undergoing injections currently  Also taking oxycodone 3-4 times daily  May ultimately need a fusion  She will continue to f/u with specialist

## 2019-10-22 NOTE — Assessment & Plan Note (Signed)
Seeing neurology Also having cervical stenosis addressed  aimovig was denied by her ins-she is working on this

## 2019-10-22 NOTE — Assessment & Plan Note (Signed)
Currently holding vyvanse for elevated BP

## 2019-10-22 NOTE — Assessment & Plan Note (Signed)
Pt stopped crestor due to muscle pain

## 2019-10-29 ENCOUNTER — Encounter: Payer: Self-pay | Admitting: Family Medicine

## 2019-10-29 ENCOUNTER — Other Ambulatory Visit: Payer: Self-pay

## 2019-10-29 ENCOUNTER — Telehealth: Payer: Self-pay

## 2019-10-29 ENCOUNTER — Emergency Department (HOSPITAL_COMMUNITY): Payer: Commercial Managed Care - PPO

## 2019-10-29 ENCOUNTER — Encounter (HOSPITAL_COMMUNITY): Payer: Self-pay

## 2019-10-29 ENCOUNTER — Emergency Department (HOSPITAL_COMMUNITY)
Admission: EM | Admit: 2019-10-29 | Discharge: 2019-10-29 | Disposition: A | Discharge status: Home / Self Care | Payer: Commercial Managed Care - PPO | Attending: Emergency Medicine | Admitting: Emergency Medicine

## 2019-10-29 DIAGNOSIS — R42 Dizziness and giddiness: Secondary | ICD-10-CM | POA: Diagnosis not present

## 2019-10-29 DIAGNOSIS — R072 Precordial pain: Secondary | ICD-10-CM

## 2019-10-29 DIAGNOSIS — Z79899 Other long term (current) drug therapy: Secondary | ICD-10-CM | POA: Insufficient documentation

## 2019-10-29 DIAGNOSIS — I10 Essential (primary) hypertension: Secondary | ICD-10-CM | POA: Diagnosis present

## 2019-10-29 LAB — COMPREHENSIVE METABOLIC PANEL
ALT: 25 U/L (ref 0–44)
AST: 26 U/L (ref 15–41)
Albumin: 4.9 g/dL (ref 3.5–5.0)
Alkaline Phosphatase: 79 U/L (ref 38–126)
Anion gap: 11 (ref 5–15)
BUN: 9 mg/dL (ref 6–20)
CO2: 27 mmol/L (ref 22–32)
Calcium: 9.7 mg/dL (ref 8.9–10.3)
Chloride: 104 mmol/L (ref 98–111)
Creatinine, Ser: 0.6 mg/dL (ref 0.44–1.00)
GFR calc Af Amer: >60 mL/min (ref >60)
GFR calc non Af Amer: >60 mL/min (ref >60)
Glucose, Bld: 116 mg/dL — ABNORMAL HIGH (ref 70–99)
Potassium: 3.7 mmol/L (ref 3.5–5.1)
Sodium: 142 mmol/L (ref 135–145)
Total Bilirubin: 0.9 mg/dL (ref 0.3–1.2)
Total Protein: 7.9 g/dL (ref 6.5–8.1)

## 2019-10-29 LAB — CBC
HCT: 43.6 % (ref 36.0–46.0)
Hemoglobin: 15 g/dL (ref 12.0–15.0)
MCH: 30.1 pg (ref 26.0–34.0)
MCHC: 34.4 g/dL (ref 30.0–36.0)
MCV: 87.4 fL (ref 80.0–100.0)
Platelets: 285 10*3/uL (ref 150–400)
RBC: 4.99 MIL/uL (ref 3.87–5.11)
RDW: 11.6 % (ref 11.5–15.5)
WBC: 6.2 10*3/uL (ref 4.0–10.5)
nRBC: 0 % (ref 0.0–0.2)

## 2019-10-29 LAB — TROPONIN I (HIGH SENSITIVITY)
Troponin I (High Sensitivity): 2 ng/L (ref <18)
Troponin I (High Sensitivity): <2 ng/L (ref <18)

## 2019-10-29 MED ORDER — LISINOPRIL 10 MG PO TABS: 10.0000 mg | ORAL_TABLET | Freq: Every day | ORAL | 0 refills | Status: DC

## 2019-10-29 MED ORDER — LISINOPRIL 10 MG PO TABS
5.0000 mg | ORAL_TABLET | Freq: Once | ORAL | Status: AC
  Administered 2019-10-29: 5 mg via ORAL
  Filled 2019-10-29: qty 1

## 2019-10-29 MED ORDER — SODIUM CHLORIDE 0.9% FLUSH: 3.0000 mL | Freq: Once | INTRAVENOUS | Status: DC

## 2019-10-29 NOTE — Discharge Instructions (Signed)
Please read and follow all provided instructions.   Your diagnoses today include:  1. Hypertension, unspecified type   2. Precordial pain   3. Dizziness     Tests performed today include:  An EKG of your heart  A chest x-ray  Cardiac enzymes - a blood test for heart muscle damage  Blood counts and electrolytes  CT of your brain - normal  Vital signs. See below for your results today.   Medications prescribed:   Lisinopril - medication for blood pressure  Take any prescribed medications only as directed.  Follow-up instructions: Please follow-up with your primary care provider as soon as you can for further evaluation of your symptoms.   Return instructions:  SEEK IMMEDIATE MEDICAL ATTENTION IF:  You have severe chest pain, especially if the pain is crushing or pressure-like and spreads to the arms, back, neck, or jaw, or if you have sweating, nausea (feeling sick to your stomach), or shortness of breath. THIS IS AN EMERGENCY. Don't wait to see if the pain will go away. Get medical help at once. Call 911 or [phone] (operator). DO NOT drive yourself to the hospital.   Your chest pain gets worse and does not go away with rest.   You have an attack of chest pain lasting longer than usual, despite rest and treatment with the medications your caregiver has prescribed.   You wake from sleep with chest pain or shortness of breath.  You feel dizzy or faint.  You have chest pain not typical of your usual pain for which you originally saw your caregiver.   You have any other emergent concerns regarding your health.  Additional Information: Chest pain comes from many different causes. Your caregiver has diagnosed you as having chest pain that is not specific for one problem, but does not require admission.  You are at low risk for an acute heart condition or other serious illness.   Your vital signs today were: BP (!) 172/105 (BP Location: Left Arm)   Pulse 64   Temp 98.4 F  (36.9 C) (Oral)   Resp 18   LMP  (LMP Unknown)   SpO2 95%  If your blood pressure (BP) was elevated above 135/85 this visit, please have this repeated by your doctor within one month. --------------

## 2019-10-29 NOTE — ED Provider Notes (Addendum)
[facility] DEPT Provider Note   CSN: [id number] Arrival date & time: 10/29/19  1034     History Chief Complaint  Patient presents with  . Hypertension  . Chest Pain    Laurie [name] is a 54 y.o. female.  Patient with history of chronic neck pain, anxiety, family history of heart disease, migraine headaches --presents to the emergency department with complaint of elevated blood pressure and chest pain.  Patient states that she has been having episodes of very high blood pressure over the past several weeks.  They have been up into the [id number] systolic range at times.  She states that her primary care doctor has put her on Toprol-XL which she has been taking.  This will typically help her blood pressure come down into the 180s.  During these episodes patient will have multiple symptoms.  This morning she had chest pain in the lateral left chest that went into her left arm.  No associated diaphoresis, vomiting.  In addition, most of the time she feels very unsteady describing the sensation as being in an [place] when she used to live in [place].  She is able to walk but states it feels like her body is shaking.  She does not really describe a spinning sensation.  She has had some blurry vision but no loss of vision.  She denies any pain in her neck different than her chronic pain.  Patient denies risk factors for pulmonary embolism including: unilateral leg swelling, history of DVT/PE/other blood clots, use of exogenous hormones, recent immobilizations, recent surgery, recent travel (>4hr segment), malignancy, hemoptysis.  Patient denies other signs of stroke including: facial droop, slurred speech, aphasia, weakness/numbness in extremities, trouble walking.  History of cardiac evaluation with exercise stress test in 01/2017 that was normal.        Past Medical History:  Diagnosis Date  . Abnormal uterine bleeding   . Anemia   . Anxiety    situational  .  Chronic neck pain    from disc dz  . Dysmenorrhea   . Endometriosis   . Fibroid   . IBS (irritable bowel syndrome)    diarrhea predominant  . Insomnia    organic sleep disorder, failed multiple meds  . Migraine   . Urinary incontinence     Patient Active Problem List   Diagnosis Date Noted  . Hypertension 10/22/2019  . Hypokalemia 02/26/2019  . Chronic migraine w/o aura w/o status migrainosus, not intractable 02/26/2019  . Binge eating 02/01/2019  . Elevated glucose 06/10/2018  . Hyperlipidemia 06/07/2018  . ADHD (attention deficit hyperactivity disorder), combined type 04/17/2018  . Lateral epicondylitis 04/17/2018  . Menopause syndrome 04/17/2018  . Poor concentration 03/06/2018  . IBS (irritable bowel syndrome) 09/04/2017  . Chest pain in adult 01/02/2017  . Adjustment disorder with mixed anxiety and depressed mood 05/25/2016  . Breast lump in female 03/04/2015  . Pelvic pain in female 03/04/2015  . Frequent urination 03/04/2015  . Low back pain 03/04/2015  . Vitamin D deficiency 03/04/2015  . Chronic daily headache 02/19/2014  . Fatigue 04/27/2012  . Ovarian cyst 04/27/2012  . Insomnia 06/06/2007  . [place] DISEASE 06/05/2007  . Stress reaction 04/04/2007  . Cervical stenosis of spine 04/04/2007  . Anxiety 04/04/2007    Past Surgical History:  Procedure Laterality Date  . ABDOMINAL HYSTERECTOMY    . ABDOMINAL HYSTERECTOMY    . BREAST BIOPSY Right   . BREAST CYST ASPIRATION Right   . Deg  C-S disk/spinal stenosis  06/2007  . DILATION AND CURETTAGE OF UTERUS    . ENDOMETRIAL ABLATION    . epidural injections    . KNEE SURGERY    . PELVIC LAPAROSCOPY    . TUBAL LIGATION       OB History    Gravida  4   Para  3   Term  3   Preterm      AB  1   Living  3     SAB  1   TAB      Ectopic      Multiple      Live Births  3           Family History  Problem Relation Age of Onset  . Heart disease Other        CAD  . Heart  disease Other        massive MI  . COPD Father        Deceased, 68  . Heart disease Father        CAD  . Migraines Father   . Heart attack Father   . Migraines Brother   . Thyroid disease Mother        Living, 17  . Migraines Daughter   . Breast cancer Maternal Grandmother   . Heart attack Maternal Grandfather   . Heart disease Paternal Grandmother     Social History   Tobacco Use  . Smoking status: Never Smoker  . Smokeless tobacco: Never Used  Substance Use Topics  . Alcohol use: Yes    Alcohol/week: 0.0 standard drinks    Comment: occ/rare  . Drug use: No    Home Medications Prior to Admission medications   Medication Sig Start Date End Date Taking? Authorizing Provider  doxepin (SINEQUAN) 50 MG capsule Take 1 capsule (50 mg total) by mouth at bedtime. 02/26/19   Tower, Wynelle Fanny, MD  Erenumab-aooe (AIMOVIG) 140 MG/ML SOAJ Inject 140 mg into the skin every 30 (thirty) days. 08/13/19   Patel, Donika K, DO  escitalopram (LEXAPRO) 20 MG tablet TAKE 1 TABLET BY MOUTH EVERY DAY 07/16/19   Tower, Wynelle Fanny, MD  lisdexamfetamine (VYVANSE) 70 MG capsule Take 1 capsule (70 mg total) by mouth daily. 09/20/19   Tower, Wynelle Fanny, MD  metoprolol succinate (TOPROL XL) 50 MG 24 hr tablet Take 1 tablet (50 mg total) by mouth daily. Take with or immediately following a meal. 10/22/19   Tower, Wynelle Fanny, MD  oxyCODONE-acetaminophen (PERCOCET) 10-325 MG tablet TAKE 1 TABLET BY MOUTH 4 A DAY AS NEEDED    [provider]  rizatriptan (MAXALT) 10 MG tablet TAKE ONE TABLET BY MOUTH AS NEEDED FOR MIGRAINE. MAY REPEAT IN 2 HOURS IF NEEDED. APPOINTMENT NEEDED. 07/17/19   Narda Amber K, DO  tiZANidine (ZANAFLEX) 4 MG tablet Take 4 mg by mouth 3 (three) times daily as needed. 10/14/19   [provider]  zolpidem (AMBIEN) 10 MG tablet TAKE 1 TABLET(10 MG) BY MOUTH AT BEDTIME AS NEEDED 10/14/19   Tower, Wynelle Fanny, MD    Allergies    Latex, Codeine, Codeine phosphate, Crestor [rosuvastatin],  Penicillins, and Statins  Review of Systems   Review of Systems  Constitutional: Positive for fatigue. Negative for fever.  HENT: Negative for rhinorrhea and sore throat.   Eyes: Negative for redness.  Respiratory: Negative for cough and shortness of breath.   Cardiovascular: Positive for chest pain.  Gastrointestinal: Negative for abdominal pain, diarrhea, nausea  and vomiting.  Genitourinary: Negative for dysuria.  Musculoskeletal: Negative for myalgias.  Skin: Negative for rash.  Neurological: Positive for dizziness, tremors, weakness, light-headedness and headaches. Negative for seizures, syncope, facial asymmetry and numbness.  Psychiatric/Behavioral: The patient is nervous/anxious.     Physical Exam Updated Vital Signs BP (!) 172/105 (BP Location: Left Arm)   Pulse 64   Temp 98.4 F (36.9 C) (Oral)   Resp 18   LMP  (LMP Unknown)   SpO2 95%   Physical Exam Vitals and nursing note reviewed.  Constitutional:      Appearance: She is well-developed. She is not diaphoretic.  HENT:     Head: Normocephalic and atraumatic.     Right Ear: Tympanic membrane, ear canal and external ear normal.     Left Ear: Tympanic membrane, ear canal and external ear normal.     Nose: Nose normal.     Mouth/Throat:     Mouth: Mucous membranes are not dry.     Pharynx: Uvula midline.  Eyes:     General: Lids are normal.     Extraocular Movements:     Right eye: No nystagmus.     Left eye: No nystagmus.     Conjunctiva/sclera: Conjunctivae normal.     Pupils: Pupils are equal, round, and reactive to light.  Neck:     Vascular: Normal carotid pulses. No carotid bruit or JVD.     Trachea: Trachea normal. No tracheal deviation.  Cardiovascular:     Rate and Rhythm: Normal rate and regular rhythm.     Pulses: No decreased pulses.     Heart sounds: Normal heart sounds, S1 normal and S2 normal. No murmur.  Pulmonary:     Effort: Pulmonary effort is normal. No respiratory distress.     Breath  sounds: Normal breath sounds. No wheezing.  Chest:     Chest wall: No tenderness.  Abdominal:     General: Bowel sounds are normal.     Palpations: Abdomen is soft.     Tenderness: There is no abdominal tenderness. There is no guarding or rebound.  Musculoskeletal:        General: Normal range of motion.     Cervical back: Normal range of motion and neck supple. No tenderness or bony tenderness. No muscular tenderness.  Skin:    General: Skin is warm and dry.     Coloration: Skin is not pale.  Neurological:     General: No focal deficit present.     Mental Status: She is alert and oriented to person, place, and time.     GCS: GCS eye subscore is 4. GCS verbal subscore is 5. GCS motor subscore is 6.     Cranial Nerves: No cranial nerve deficit.     Sensory: No sensory deficit.     Motor: No weakness.     Coordination: Coordination normal.     Gait: Gait normal.     Deep Tendon Reflexes: Reflexes are normal and symmetric.  Psychiatric:        Mood and Affect: Mood is anxious.     ED Results / Procedures / Treatments   Labs (all labs ordered are listed, but only abnormal results are displayed) Labs Reviewed  COMPREHENSIVE METABOLIC PANEL - Abnormal; Notable for the following components:      Result Value   Glucose, Bld 116 (*)    All other components within normal limits  CBC  TROPONIN I (HIGH SENSITIVITY)  TROPONIN I (HIGH SENSITIVITY)  ED ECG REPORT   Date: 10/29/2019  Rate: 74  Rhythm: normal sinus rhythm  QRS Axis: normal  Intervals: normal  ST/T Wave abnormalities: normal  Conduction Disutrbances:none  Narrative Interpretation:   Old EKG Reviewed: unchanged  I have personally reviewed the EKG tracing and agree with the computerized printout as noted.  Radiology DG Chest 2 View  Result Date: 10/29/2019 CLINICAL DATA:  Chest pain EXAM: CHEST - 2 VIEW COMPARISON:  12/05/2003 FINDINGS: Normal heart size and mediastinal contours. No acute infiltrate or edema. No  effusion or pneumothorax. No acute osseous findings. IMPRESSION: Negative chest. Electronically Signed   By: Monte Fantasia M.D.   On: 10/29/2019 11:15    Procedures Procedures (including critical care time)  Medications Ordered in ED Medications  sodium chloride flush (NS) 0.9 % injection 3 mL (has no administration in time range)  lisinopril (ZESTRIL) tablet 5 mg (has no administration in time range)    ED Course  I have reviewed the triage vital signs and the nursing notes.  Pertinent labs & imaging results that were available during my care of the patient were reviewed by me and considered in my medical decision making (see chart for details).  Clinical Course as of Oct 28 1520  Tue Oct 29, 2019  1251 Troponin I (High Sensitivity): 2 [NB]    Clinical Course User Index [NB] Drema Halon, Talitha Givens    Patient seen and examined.  EKG reviewed.  Work-up to this point with normal EKG, negative troponin x1, negative chest x-ray.  Patient's blood pressure continues to be elevated.  She has a reassuring exam including neuro exam.  Will obtain second cardiac marker.  In addition, given patient's atypical headache for her, and her disequilibrium symptoms, will obtain CT of the head to rule out any signs of bleeding or other abnormality.  If negative, anticipate discharged home.  Vital signs reviewed and are as follows: BP (!) 172/105 (BP Location: Left Arm)   Pulse 64   Temp 98.4 F (36.9 C) (Oral)   Resp 18   LMP  (LMP Unknown)   SpO2 95%    Troponin 2 >> >2.  Head CT reviewed and is negative.  Patient updated on results.  Will give dose of lisinopril prior to discharge.  We discussed need to follow-up with PCP for other issues.  She again raises the fact that she is excessively fatigued and I discussed that other work-up will need to be done for this most likely.  6:34 PM Patient was counseled to return with severe chest pain, especially if the pain is crushing or  pressure-like and spreads to the arms, back, neck, or jaw, or if they have sweating, nausea, or shortness of breath with the pain. They were encouraged to call 911 with these symptoms.   They were also told to return if their chest pain gets worse and does not go away with rest, they have an attack of chest pain lasting longer than usual despite rest and treatment with the medications their caregiver has prescribed, if they wake from sleep with chest pain or shortness of breath, if they feel dizzy or faint, if they have chest pain not typical of their usual pain, or if they have any other emergent concerns regarding their health.  The patient verbalized understanding and agreed.    MDM Rules/Calculators/A&P                      CP: Evaluated with troponin x2, EKG.  Patient with nonexertional chest pains.  She does have some light headedness but no vomiting, shortness of breath.  No clinical signs of DVT on exam and she is not tachycardic or hypoxic.  She has had a stress test less than 3 years ago which was negative.  I have low concern for ACS, myocarditis, PE, dissection at this point time.  She has a low risk heart score and can follow-up with her doctor as an outpatient.  Part of this may be related to her chronic neck pain.  HTN: No end-organ damage identified. Already on metoprolol. Will start on low-dose of lisinopril and have her f/u with PCP to determine final management of blood pressure, titration of medications.  Lightheadedness, dizziness: Normal neurological exam here.  No bleed in setting of high blood pressure.  Head CT is negative.  Low concern for posterior circulation stroke.  Patient without reproducible nystagmus or persistent vertigo symptoms.  Do not feel patient requires advanced imaging today.  Fatigue: No anemia.  Unclear etiology, possibly multifactorial.   Final Clinical Impression(s) / ED Diagnoses Final diagnoses:  Hypertension, unspecified type  Precordial pain    Dizziness    Rx / DC Orders ED Discharge Orders    None       Carlisle Cater, PA-C 10/29/19 1837    Carlisle Cater, PA-C 10/29/19 [place] Gamma, DO 10/29/19 1841

## 2019-10-29 NOTE — Telephone Encounter (Signed)
[facility] note did not come thru [facility] portal; copy of [facility] note sent for scanning and a copy in Dr Alba Cory in box. Per chart review pt did go to [facility] ED.

## 2019-10-29 NOTE — ED Triage Notes (Signed)
Pt has been having issues with her BP running high. Pt states that this morning she woke up with pinching in her chest, as well as arm numbness. Pt is notably clammy in triage.

## 2019-10-30 NOTE — Telephone Encounter (Signed)
Dr. Glori Bickers sent me a message on pt saying:   I see where she went to the ER and had a normal eval/ given lisinopril  Please call her to schedule in office f/u with me  I left a detailed message on patient's voice mail to call back and schedule appointment.

## 2019-10-31 ENCOUNTER — Encounter: Payer: Self-pay | Admitting: Family Medicine

## 2019-11-01 ENCOUNTER — Other Ambulatory Visit: Payer: Self-pay

## 2019-11-01 ENCOUNTER — Ambulatory Visit: Payer: Commercial Managed Care - PPO | Admitting: Family Medicine

## 2019-11-01 ENCOUNTER — Encounter: Payer: Self-pay | Admitting: Family Medicine

## 2019-11-01 VITALS — BP 148/85 | HR 62 | Temp 96.6°F | Ht 63.0 in | Wt 145.1 lb

## 2019-11-01 DIAGNOSIS — F4323 Adjustment disorder with mixed anxiety and depressed mood: Secondary | ICD-10-CM

## 2019-11-01 DIAGNOSIS — I10 Essential (primary) hypertension: Secondary | ICD-10-CM

## 2019-11-01 DIAGNOSIS — M4802 Spinal stenosis, cervical region: Secondary | ICD-10-CM

## 2019-11-01 DIAGNOSIS — G43709 Chronic migraine without aura, not intractable, without status migrainosus: Secondary | ICD-10-CM

## 2019-11-01 DIAGNOSIS — F430 Acute stress reaction: Secondary | ICD-10-CM | POA: Diagnosis not present

## 2019-11-01 MED ORDER — AMLODIPINE BESYLATE 5 MG PO TABS: 5.0000 mg | ORAL_TABLET | Freq: Every day | ORAL | 3 refills | Status: DC

## 2019-11-01 MED ORDER — DULOXETINE HCL 30 MG PO CPEP: 30.0000 mg | ORAL_CAPSULE | Freq: Every day | ORAL | 3 refills | Status: DC

## 2019-11-01 NOTE — Patient Instructions (Addendum)
I want you to exchange your current BP cuff for OMRON arm cuff size regular   Continue metoprolol Add amlodipine for blood pressure 5 mg daily  If any problems or side effects let me know   Let's try cymbalta again for anxiety/depression  This can increase your sensitivity to doxepin- if any problems stop it and let me know   Go forward with neck and headache treatment    Let's start with a month off of work to get better - when you improve you can go back earlier  Get me the paperwork   Follow up in about 2 weeks   I placed a psychiatry referral and our office will call you to work on that

## 2019-11-01 NOTE — Progress Notes (Signed)
Subjective:    Patient ID: Laurie [name], female    DOB: August 25, 1966, 54 y.o.   MRN: [id number]  This visit occurred during the SARS-CoV-2 public health emergency.  Safety protocols were in place, including screening questions prior to the visit, additional usage of staff PPE, and extensive cleaning of exam room while observing appropriate contact time as indicated for disinfecting solutions.    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    Wt Readings from Last 3 Encounters:  11/01/19 145 lb 1 oz (65.8 kg)  08/19/19 144 lb (65.3 kg)  02/25/19 138 lb (62.6 kg)   25.70 kg/m   Hypertension  BP Readings from Last 3 Encounters:  11/01/19 128/80  10/29/19 (!) 168/93  10/22/19 (!) 148/103   Re check 148/85 L arm with regular cuff  With pt's wrist cuff - 192 119   Pulse Readings from Last 3 Encounters:  11/01/19 62  10/29/19 64  10/22/19 78    Was seen in the hospital recently for elevated bp  He was placed on metoprolol xl at last visit here  The ER added lisinopril -to which she had side effects   (nausea - even at the 5 mg dose)  Thinks it is also worsening her headache    She is on inactive leave now due to all of her medical problems)  Told to get on short term disability    ED visit 1/12- for elevated bp and chest pain and lightheadedness bp 172/105 Re assuring w/u incl ekg/cxr and labs and head CT  DG Chest 2 View  Result Date: 10/29/2019 CLINICAL DATA:  Chest pain EXAM: CHEST - 2 VIEW COMPARISON:  12/05/2003 FINDINGS: Normal heart size and mediastinal contours. No acute infiltrate or edema. No effusion or pneumothorax. No acute osseous findings. IMPRESSION: Negative chest. Electronically Signed   By: Monte Fantasia M.D.   On: 10/29/2019 11:15   CT Head Wo Contrast  Result Date: 10/29/2019 CLINICAL DATA:  54 year old female  with headache. EXAM: CT HEAD WITHOUT CONTRAST TECHNIQUE: Contiguous axial images were obtained from the base of the skull through the vertex without intravenous contrast. COMPARISON:  Head CT dated 02/10/2014. FINDINGS: Brain: The ventricles and sulci appropriate size for patient's age. The gray-white matter discrimination is preserved. There is no acute intracranial hemorrhage. No mass effect or midline shift. No extra-axial fluid collection. Vascular: No hyperdense vessel or unexpected calcification. Skull: Normal. Negative for fracture or focal lesion. Sinuses/Orbits: No acute finding. Other: None IMPRESSION: No acute intracranial pathology. Electronically Signed   By: Anner Crete M.D.   On: 10/29/2019 16:20       Migraine and cervical stenosis   Sees neurology  Also orthopedics -had an first cervical injection - and she thinks it has helped a little   (had migraine that day)  Dr Nelva Bush She has to re schedule for the 2nd shot  3rd one is supposed to be in feb  Has been given oxycodone - takes 1-2 per day - is taking less than she was (for neck)   Is to follow up with neuro after  ? As to how cervical stenosis is affecting migraines    Today headache is mild   (skipped her lisinopril last night)  On a different muscle relaxer -tizaidine and it helps the headaches much more and she is able to sleep  Was on aimovig and had to stop when ins stopped paying for it  Has had a trigger point injection once-no help  Has not had botox  maxalt- has not needed as often but has it   Anxiety disorder with stress reaction  Still an emotional wreck  Exhausted   Doxepin 50 mg qhs - ? If affected bp  She went on and off of it  lexapro-made her feel numb (and sleepy)  ambien   Past - prozac and cymbalta  prozac made her feel weird cymbalta caused wt gain - open to starting again   Work- pressure / struggling with that  Chronic pain - neck and headache  Recent end of relationship -- very  hard / is now starting to get closure   She went to a counselor once- did not go well  Told her to "bounce on yoga ball"  She is comforting herself -reading bible more   H/o adhd- stopped her vyvanse due to elevated bp   Patient Active Problem List   Diagnosis Date Noted  . Hypertension 10/22/2019  . Hypokalemia 02/26/2019  . Chronic migraine w/o aura w/o status migrainosus, not intractable 02/26/2019  . Binge eating 02/01/2019  . Elevated glucose 06/10/2018  . Hyperlipidemia 06/07/2018  . ADHD (attention deficit hyperactivity disorder), combined type 04/17/2018  . Lateral epicondylitis 04/17/2018  . Menopause syndrome 04/17/2018  . Poor concentration 03/06/2018  . IBS (irritable bowel syndrome) 09/04/2017  . Chest pain in adult 01/02/2017  . Adjustment disorder with mixed anxiety and depressed mood 05/25/2016  . Low back pain 03/04/2015  . Vitamin D deficiency 03/04/2015  . Chronic daily headache 02/19/2014  . Fatigue 04/27/2012  . Ovarian cyst 04/27/2012  . Insomnia 06/06/2007  . [place] DISEASE 06/05/2007  . Stress reaction 04/04/2007  . Cervical stenosis of spine 04/04/2007   Past Medical History:  Diagnosis Date  . Abnormal uterine bleeding   . Anemia   . Anxiety    situational  . Chronic neck pain    from disc dz  . Dysmenorrhea   . Endometriosis   . Fibroid   . IBS (irritable bowel syndrome)    diarrhea predominant  . Insomnia    organic sleep disorder, failed multiple meds  . Migraine   . Urinary incontinence    Past Surgical History:  Procedure Laterality Date  . ABDOMINAL HYSTERECTOMY    . ABDOMINAL HYSTERECTOMY    . BREAST BIOPSY Right   . BREAST CYST ASPIRATION Right   . Deg C-S disk/spinal stenosis  06/2007  . DILATION AND CURETTAGE OF UTERUS    . ENDOMETRIAL ABLATION    . epidural injections    . KNEE SURGERY    . PELVIC LAPAROSCOPY    . TUBAL LIGATION     Social History   Tobacco Use  . Smoking status: Never Smoker  .  Smokeless tobacco: Never Used  Substance Use Topics  . Alcohol use: Yes    Alcohol/week: 0.0 standard drinks    Comment: occ/rare  . Drug use: No   Family History  Problem Relation Age of Onset  . Heart disease Other        CAD  . Heart disease Other        massive MI  . COPD Father        Deceased, 22  . Heart disease Father        CAD  . Migraines Father   . Heart attack Father   . Migraines  Brother   . Thyroid disease Mother        Living, 32  . Migraines Daughter   . Breast cancer Maternal Grandmother   . Heart attack Maternal Grandfather   . Heart disease Paternal Grandmother    Allergies  Allergen Reactions  . Latex Rash  . Codeine     REACTION: vomiting  . Codeine Phosphate     REACTION: vomiting  . Crestor [Rosuvastatin]     Muscle pain   . Lisinopril     Headache and severe nausea  . Penicillins     REACTION: urticaria (hives) Did it involve swelling of the face/tongue/throat, SOB, or low BP? No Did it involve sudden or severe rash/hives, skin peeling, or any reaction on the inside of your mouth or nose? No Did you need to seek medical attention at a hospital or doctor's office? No When did it last happen? If all above answers are "NO", may proceed with cephalosporin use.   . Statins     Muscle pain    Current Outpatient Medications on File Prior to Visit  Medication Sig Dispense Refill  . doxepin (SINEQUAN) 50 MG capsule Take 1 capsule (50 mg total) by mouth at bedtime. 90 capsule 3  . metoprolol succinate (TOPROL XL) 50 MG 24 hr tablet Take 1 tablet (50 mg total) by mouth daily. Take with or immediately following a meal. 30 tablet 1  . oxyCODONE-acetaminophen (PERCOCET) 10-325 MG tablet Take 1 tablet by mouth every 6 (six) hours as needed for pain.     . rizatriptan (MAXALT) 10 MG tablet TAKE ONE TABLET BY MOUTH AS NEEDED FOR MIGRAINE. MAY REPEAT IN 2 HOURS IF NEEDED. APPOINTMENT NEEDED. 10 tablet 5  . tiZANidine (ZANAFLEX) 4 MG tablet Take 4  mg by mouth 3 (three) times daily as needed for muscle spasms.     Marland Kitchen zolpidem (AMBIEN) 10 MG tablet TAKE 1 TABLET(10 MG) BY MOUTH AT BEDTIME AS NEEDED (Patient taking differently: Take 10 mg by mouth at bedtime as needed for sleep. TAKE 1 TABLET(10 MG) BY MOUTH AT BEDTIME AS NEEDED) 90 tablet 0  . [DISCONTINUED] escitalopram (LEXAPRO) 20 MG tablet TAKE 1 TABLET BY MOUTH EVERY DAY (Patient not taking: Reported on 10/29/2019) 90 tablet 0  . [DISCONTINUED] lisdexamfetamine (VYVANSE) 70 MG capsule Take 1 capsule (70 mg total) by mouth daily. (Patient not taking: Reported on 10/29/2019) 30 capsule 0   No current facility-administered medications on file prior to visit.     Review of Systems  Constitutional: Positive for fatigue. Negative for activity change, appetite change, fever and unexpected weight change.  HENT: Negative for congestion, ear pain, rhinorrhea, sinus pressure and sore throat.   Eyes: Negative for pain, redness and visual disturbance.  Respiratory: Negative for cough, shortness of breath and wheezing.   Cardiovascular: Negative for chest pain and palpitations.  Gastrointestinal: Negative for abdominal pain, blood in stool, constipation and diarrhea.  Endocrine: Negative for polydipsia and polyuria.  Genitourinary: Negative for dysuria, frequency and urgency.  Musculoskeletal: Positive for neck pain. Negative for arthralgias, back pain and myalgias.  Skin: Negative for pallor and rash.  Allergic/Immunologic: Negative for environmental allergies.  Neurological: Positive for headaches. Negative for dizziness and syncope.  Hematological: Negative for adenopathy. Does not bruise/bleed easily.  Psychiatric/Behavioral: Positive for decreased concentration, dysphoric mood and sleep disturbance. Negative for suicidal ideas. The patient is nervous/anxious.        Objective:   Physical Exam Constitutional:      General: She is  not in acute distress.    Appearance: Normal appearance. She  is well-developed and normal weight. She is not ill-appearing.  HENT:     Head: Normocephalic and atraumatic.     Mouth/Throat:     Mouth: Mucous membranes are moist.  Eyes:     General: No scleral icterus.    Conjunctiva/sclera: Conjunctivae normal.     Pupils: Pupils are equal, round, and reactive to light.  Neck:     Thyroid: No thyromegaly.     Vascular: No carotid bruit or JVD.  Cardiovascular:     Rate and Rhythm: Normal rate and regular rhythm.     Heart sounds: Normal heart sounds. No gallop.   Pulmonary:     Effort: Pulmonary effort is normal. No respiratory distress.     Breath sounds: Normal breath sounds. No wheezing or rales.  Abdominal:     General: Bowel sounds are normal. There is no distension or abdominal bruit.     Palpations: Abdomen is soft. There is no mass.     Tenderness: There is no abdominal tenderness.  Musculoskeletal:     Cervical back: Neck supple. Tenderness present.     Right lower leg: No edema.     Left lower leg: No edema.  Lymphadenopathy:     Cervical: No cervical adenopathy.  Skin:    General: Skin is warm and dry.     Coloration: Skin is not pale.     Findings: No erythema or rash.  Neurological:     Mental Status: She is alert.     Cranial Nerves: No cranial nerve deficit.     Motor: No weakness or tremor.     Gait: Gait normal.     Deep Tendon Reflexes: Reflexes are normal and symmetric. Reflexes normal.  Psychiatric:        Attention and Perception: Attention normal.        Mood and Affect: Mood is anxious and depressed. Affect is tearful.        Speech: Speech normal.        Behavior: Behavior normal.        Thought Content: Thought content normal. Thought content does not include suicidal ideation.        Cognition and Memory: Cognition and memory normal.     Comments: Candidly discusses severe stressors and anxiety            Assessment & Plan:   Problem List Items Addressed This Visit      Cardiovascular and  Mediastinum   Chronic migraine w/o aura w/o status migrainosus, not intractable    Sees neurology- unfortunately amiovig was not covered (it helped)  Had a trigger point injection  Getting cervical injections now for cervical stenosis which should help headaches  Headaches may add to /be worsened by elevated BP      Relevant Medications   amLODipine (NORVASC) 5 MG tablet   DULoxetine (CYMBALTA) 30 MG capsule   Hypertension    Pt has had very high BP in setting of headache/neck pain and anxiety  Has been in ER- Reviewed hospital records, lab results and studies in detail   Reassuring w/u Today some improvement  BP: (!) 148/85    Taking metoprolol  Did not tolerate lisinopril  Px amlodipine 5 mg daily - inst to call if side effects or problems  F/u planned       Relevant Medications   amLODipine (NORVASC) 5 MG tablet     Other   Stress  reaction    Recent break up /work stress and chronic pain are adding to her dep/anx issues      Relevant Medications   DULoxetine (CYMBALTA) 30 MG capsule   Cervical stenosis of spine    Seeing Dr Nelva Bush for injections Takes oxycodone- has started to wean  Hoping tx will help headaches also      Adjustment disorder with mixed anxiety and depressed mood - Primary    Reviewed stressors/ coping techniques/symptoms/ support sources/ tx options and side effects in detail today Pt has been in ER for elevated bp and headaches related to this  Reviewed stressors/ coping techniques/symptoms/ support sources/ tx options and side effects in detail today  Job performance is suffering  Pt declined another trial of counseling  Unsure of doxepen helps  prozac intol in the past, also lexapro  Most effective tx was cymbalta (she did gain wt with it but she is open to trying again)  Will start this at 30 mg Discussed expectations of SNRI medication including time to effectiveness and mechanism of action, also poss of side effects (early and late)-  including mental fuzziness, weight or appetite change, nausea and poss of worse dep or anxiety (even suicidal thoughts)  Pt voiced understanding and will stop med and update if this occurs   Also placed ref to psychiatry for difficulty with tx  Disc imp of self care in detail  Will plan a leave from work to enable her to work on this while managing medications  Also to work on chronic pain issues (cervical and headaches)  F/u planned for 2 wk        Relevant Orders   Ambulatory referral to Psychiatry

## 2019-11-03 NOTE — Assessment & Plan Note (Signed)
Sees neurology- unfortunately amiovig was not covered (it helped)  Had a trigger point injection  Getting cervical injections now for cervical stenosis which should help headaches  Headaches may add to /be worsened by elevated BP

## 2019-11-03 NOTE — Assessment & Plan Note (Signed)
Seeing Dr Nelva Bush for injections Takes oxycodone- has started to wean  Hoping tx will help headaches also

## 2019-11-03 NOTE — Assessment & Plan Note (Signed)
Reviewed stressors/ coping techniques/symptoms/ support sources/ tx options and side effects in detail today Pt has been in ER for elevated bp and headaches related to this  Reviewed stressors/ coping techniques/symptoms/ support sources/ tx options and side effects in detail today  Job performance is suffering  Pt declined another trial of counseling  Unsure of doxepen helps  prozac intol in the past, also lexapro  Most effective tx was cymbalta (she did gain wt with it but she is open to trying again)  Will start this at 30 mg Discussed expectations of SNRI medication including time to effectiveness and mechanism of action, also poss of side effects (early and late)- including mental fuzziness, weight or appetite change, nausea and poss of worse dep or anxiety (even suicidal thoughts)  Pt voiced understanding and will stop med and update if this occurs   Also placed ref to psychiatry for difficulty with tx  Disc imp of self care in detail  Will plan a leave from work to enable her to work on this while managing medications  Also to work on chronic pain issues (cervical and headaches)  F/u planned for 2 wk

## 2019-11-03 NOTE — Assessment & Plan Note (Signed)
Pt has had very high BP in setting of headache/neck pain and anxiety  Has been in ER- Reviewed hospital records, lab results and studies in detail   Reassuring w/u Today some improvement  BP: (!) 148/85    Taking metoprolol  Did not tolerate lisinopril  Px amlodipine 5 mg daily - inst to call if side effects or problems  F/u planned

## 2019-11-03 NOTE — Assessment & Plan Note (Signed)
Recent break up /work stress and chronic pain are adding to her dep/anx issues

## 2019-11-04 ENCOUNTER — Encounter: Payer: Self-pay | Admitting: Family Medicine

## 2019-11-05 NOTE — Telephone Encounter (Signed)
Given to Robin, Dr. Glori Bickers also said "please add her other info if needed, thanks"

## 2019-11-05 NOTE — Telephone Encounter (Signed)
Paperwork faxed Pt aware  

## 2019-11-06 NOTE — Telephone Encounter (Signed)
Copy for pt °Copy for scan °

## 2019-11-13 ENCOUNTER — Other Ambulatory Visit: Payer: Self-pay | Admitting: Family Medicine

## 2019-11-15 ENCOUNTER — Ambulatory Visit: Payer: Commercial Managed Care - PPO | Admitting: Family Medicine

## 2019-11-15 ENCOUNTER — Encounter: Payer: Self-pay | Admitting: Family Medicine

## 2019-11-15 ENCOUNTER — Other Ambulatory Visit: Payer: Self-pay

## 2019-11-15 VITALS — BP 146/90 | HR 108 | Temp 98.0°F | Ht 63.0 in | Wt 148.2 lb

## 2019-11-15 DIAGNOSIS — Z23 Encounter for immunization: Secondary | ICD-10-CM

## 2019-11-15 DIAGNOSIS — F4323 Adjustment disorder with mixed anxiety and depressed mood: Secondary | ICD-10-CM | POA: Diagnosis not present

## 2019-11-15 DIAGNOSIS — I10 Essential (primary) hypertension: Secondary | ICD-10-CM

## 2019-11-15 DIAGNOSIS — M4802 Spinal stenosis, cervical region: Secondary | ICD-10-CM

## 2019-11-15 DIAGNOSIS — G43709 Chronic migraine without aura, not intractable, without status migrainosus: Secondary | ICD-10-CM

## 2019-11-15 NOTE — Patient Instructions (Signed)
BP is still too high  Get back on both blood pressure medicines  If any issues re: feeling faint- let me know  Get a new cuff when you can Do not get up quickly  Drink fluids! And try to eat regular meals   Flu shot today   Continue the Cymbalta   Get your neck injection- that will help and hopefully you can get some sleep   Follow up with me in about 2 weeks (before you return to work)

## 2019-11-15 NOTE — Progress Notes (Signed)
Subjective:    Patient ID: Laurie [name], female    DOB: 09-May-1966, 54 y.o.   MRN: [id number]  This visit occurred during the SARS-CoV-2 public health emergency.  Safety protocols were in place, including screening questions prior to the visit, additional usage of staff PPE, and extensive cleaning of exam room while observing appropriate contact time as indicated for disinfecting solutions.    HPI Pt presents for f/u of stress reaction and HTN   Is exhausted in general  Not sleeping well -uncomfortable with neck  The headaches have been improved    Wt Readings from Last 3 Encounters:  11/15/19 148 lb 3 oz (67.2 kg)  11/01/19 145 lb 1 oz (65.8 kg)  08/19/19 144 lb (65.3 kg)   26.25 kg/m   Neck- may be able to get her 2nd injection on Tuesday  Planning on it  Feels like the first shot wore off this week   Hypertension  Supposed to take metoprolol  Added amlodipine 5 mg daily last time   Has not checked her bp (needs another cuff)   She had an episode of near syncope when she quickly got off the couch  (held the metoprolol after that)  She thinks in retrospect she got up too fast    BP Readings from Last 3 Encounters:  11/15/19 (!) 146/90  11/01/19 (!) 148/85  10/29/19 (!) 168/93    Pulse Readings from Last 3 Encounters:  11/15/19 (!) 108  11/01/19 62  10/29/19 64     Stress reaction (depressive and anx symptoms) Started back on cymbalta last time (intol to multiple other medicines) Also taking doxepin   It makes her tired  Depression/anxiety symptoms are starting to improve  Was able to talk to her ex on the phone w/o getting upset -which is good    Psychiatry ref done last time  Also leave from work for this and headaches and neck problems  (for 1 mo)  She is not ready to return yet   Patient Active Problem List   Diagnosis Date Noted  . Hypertension 10/22/2019  . Hypokalemia 02/26/2019  . Chronic migraine w/o aura w/o status migrainosus, not  intractable 02/26/2019  . Binge eating 02/01/2019  . Elevated glucose 06/10/2018  . Hyperlipidemia 06/07/2018  . ADHD (attention deficit hyperactivity disorder), combined type 04/17/2018  . Lateral epicondylitis 04/17/2018  . Menopause syndrome 04/17/2018  . Poor concentration 03/06/2018  . IBS (irritable bowel syndrome) 09/04/2017  . Chest pain in adult 01/02/2017  . Adjustment disorder with mixed anxiety and depressed mood 05/25/2016  . Low back pain 03/04/2015  . Vitamin D deficiency 03/04/2015  . Chronic daily headache 02/19/2014  . Fatigue 04/27/2012  . Ovarian cyst 04/27/2012  . Insomnia 06/06/2007  . [place] DISEASE 06/05/2007  . Stress reaction 04/04/2007  . Cervical stenosis of spine 04/04/2007   Past Medical History:  Diagnosis Date  . Abnormal uterine bleeding   . Anemia   . Anxiety    situational  . Chronic neck pain    from disc dz  . Dysmenorrhea   . Endometriosis   . Fibroid   . IBS (irritable bowel syndrome)    diarrhea predominant  . Insomnia    organic sleep disorder, failed multiple meds  . Migraine   . Urinary incontinence    Past Surgical History:  Procedure Laterality Date  . ABDOMINAL HYSTERECTOMY    . ABDOMINAL HYSTERECTOMY    . BREAST BIOPSY Right   . BREAST CYST ASPIRATION  Right   . Deg C-S disk/spinal stenosis  06/2007  . DILATION AND CURETTAGE OF UTERUS    . ENDOMETRIAL ABLATION    . epidural injections    . KNEE SURGERY    . PELVIC LAPAROSCOPY    . TUBAL LIGATION     Social History   Tobacco Use  . Smoking status: Never Smoker  . Smokeless tobacco: Never Used  Substance Use Topics  . Alcohol use: Yes    Alcohol/week: 0.0 standard drinks    Comment: occ/rare  . Drug use: No   Family History  Problem Relation Age of Onset  . Heart disease Other        CAD  . Heart disease Other        massive MI  . COPD Father        Deceased, 52  . Heart disease Father        CAD  . Migraines Father   . Heart attack  Father   . Migraines Brother   . Thyroid disease Mother        Living, 81  . Migraines Daughter   . Breast cancer Maternal Grandmother   . Heart attack Maternal Grandfather   . Heart disease Paternal Grandmother    Allergies  Allergen Reactions  . Latex Rash  . Codeine     REACTION: vomiting  . Codeine Phosphate     REACTION: vomiting  . Crestor [Rosuvastatin]     Muscle pain   . Lisinopril     Headache and severe nausea  . Penicillins     REACTION: urticaria (hives) Did it involve swelling of the face/tongue/throat, SOB, or low BP? No Did it involve sudden or severe rash/hives, skin peeling, or any reaction on the inside of your mouth or nose? No Did you need to seek medical attention at a hospital or doctor's office? No When did it last happen? If all above answers are "NO", may proceed with cephalosporin use.   . Statins     Muscle pain    Current Outpatient Medications on File Prior to Visit  Medication Sig Dispense Refill  . amLODipine (NORVASC) 5 MG tablet Take 1 tablet (5 mg total) by mouth daily. 30 tablet 3  . doxepin (SINEQUAN) 50 MG capsule Take 1 capsule (50 mg total) by mouth at bedtime. 90 capsule 3  . DULoxetine (CYMBALTA) 30 MG capsule Take 1 capsule (30 mg total) by mouth daily. 30 capsule 3  . oxyCODONE-acetaminophen (PERCOCET) 10-325 MG tablet Take 1 tablet by mouth every 6 (six) hours as needed for pain.     . rizatriptan (MAXALT) 10 MG tablet TAKE ONE TABLET BY MOUTH AS NEEDED FOR MIGRAINE. MAY REPEAT IN 2 HOURS IF NEEDED. APPOINTMENT NEEDED. 10 tablet 5  . tiZANidine (ZANAFLEX) 4 MG tablet Take 4 mg by mouth 3 (three) times daily as needed for muscle spasms.     Marland Kitchen zolpidem (AMBIEN) 10 MG tablet TAKE 1 TABLET(10 MG) BY MOUTH AT BEDTIME AS NEEDED (Patient taking differently: Take 10 mg by mouth at bedtime as needed for sleep. TAKE 1 TABLET(10 MG) BY MOUTH AT BEDTIME AS NEEDED) 90 tablet 0  . metoprolol succinate (TOPROL-XL) 50 MG 24 hr tablet TAKE 1  TABLET (50 MG TOTAL) BY MOUTH DAILY. TAKE WITH OR IMMEDIATELY FOLLOWING A MEAL. (Patient not taking: Reported on 11/15/2019) 30 tablet 5  . [DISCONTINUED] escitalopram (LEXAPRO) 20 MG tablet TAKE 1 TABLET BY MOUTH EVERY DAY (Patient not taking: Reported on 10/29/2019) 90  tablet 0  . [DISCONTINUED] lisdexamfetamine (VYVANSE) 70 MG capsule Take 1 capsule (70 mg total) by mouth daily. (Patient not taking: Reported on 10/29/2019) 30 capsule 0   No current facility-administered medications on file prior to visit.    Review of Systems  Constitutional: Positive for fatigue. Negative for activity change, appetite change, fever and unexpected weight change.  HENT: Negative for congestion, ear pain, rhinorrhea, sinus pressure and sore throat.   Eyes: Negative for pain, redness and visual disturbance.  Respiratory: Negative for cough, shortness of breath and wheezing.   Cardiovascular: Negative for chest pain and palpitations.  Gastrointestinal: Negative for abdominal pain, blood in stool, constipation and diarrhea.  Endocrine: Negative for polydipsia and polyuria.  Genitourinary: Negative for dysuria, frequency and urgency.  Musculoskeletal: Positive for neck pain. Negative for arthralgias, back pain and myalgias.  Skin: Negative for pallor and rash.  Allergic/Immunologic: Negative for environmental allergies.  Neurological: Positive for light-headedness and headaches. Negative for dizziness, syncope and facial asymmetry.       Episode of pre syncope  Hematological: Negative for adenopathy. Does not bruise/bleed easily.  Psychiatric/Behavioral: Positive for decreased concentration and sleep disturbance. Negative for confusion, dysphoric mood, self-injury and suicidal ideas. The patient is nervous/anxious.        Objective:   Physical Exam Constitutional:      General: She is not in acute distress.    Appearance: Normal appearance. She is well-developed and normal weight. She is not ill-appearing or  diaphoretic.  HENT:     Head: Normocephalic and atraumatic.     Mouth/Throat:     Mouth: Mucous membranes are moist.  Eyes:     General: No scleral icterus.    Conjunctiva/sclera: Conjunctivae normal.     Pupils: Pupils are equal, round, and reactive to light.  Neck:     Thyroid: No thyromegaly.     Vascular: No carotid bruit or JVD.     Comments: Pain with neck flexion Cardiovascular:     Rate and Rhythm: Regular rhythm. Tachycardia present.     Heart sounds: Normal heart sounds. No gallop.   Pulmonary:     Effort: Pulmonary effort is normal. No respiratory distress.     Breath sounds: Normal breath sounds. No wheezing or rales.  Abdominal:     General: Bowel sounds are normal. There is no distension or abdominal bruit.     Palpations: Abdomen is soft.  Musculoskeletal:     Cervical back: Normal range of motion and neck supple.     Right lower leg: No edema.     Left lower leg: No edema.  Lymphadenopathy:     Cervical: No cervical adenopathy.  Skin:    General: Skin is warm and dry.     Findings: No rash.  Neurological:     Mental Status: She is alert.     Cranial Nerves: No cranial nerve deficit.     Motor: Tremor present. No weakness.     Coordination: Coordination normal.     Gait: Gait normal.     Deep Tendon Reflexes: Reflexes are normal and symmetric. Reflexes normal.  Psychiatric:        Attention and Perception: Attention normal.        Mood and Affect: Mood is anxious and depressed.        Behavior: Behavior normal.        Cognition and Memory: Cognition and memory normal.     Comments: Some improvement in mood today More calm  Discusses stressors  candidly            Assessment & Plan:   Problem List Items Addressed This Visit      Cardiovascular and Mediastinum   Chronic migraine w/o aura w/o status migrainosus, not intractable    Pt continues neuro treatment  Fairly stable  Also neck injections        Hypertension    Pt stopped metoprolol  after pre syncopal episode Taking amlodipine alone BP: (!) 146/90    In retrospect episode may be more from several sedating medication (incl narcotic)  inst pt to use caution and change pos slowly  She will start metoprolol again for bp and pulse (plans to monitor)  If any further problems will update  F/u 2 wk        Other   Cervical stenosis of spine    Still struggling and pain is interfering with sleep and mood  Has injection planned next week      Adjustment disorder with mixed anxiety and depressed mood - Primary    Recently started back on cymbalta  Starting to improve / still getting used to it  Neck pain creates poor sleep with complicates this  F/u 2 more weeks  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

## 2019-11-17 NOTE — Assessment & Plan Note (Signed)
Still struggling and pain is interfering with sleep and mood  Has injection planned next week

## 2019-11-17 NOTE — Assessment & Plan Note (Signed)
Pt stopped metoprolol after pre syncopal episode Taking amlodipine alone BP: (!) 146/90    In retrospect episode may be more from several sedating medication (incl narcotic)  inst pt to use caution and change pos slowly  She will start metoprolol again for bp and pulse (plans to monitor)  If any further problems will update  F/u 2 wk

## 2019-11-17 NOTE — Assessment & Plan Note (Addendum)
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

## 2019-11-17 NOTE — Assessment & Plan Note (Signed)
Pt continues neuro treatment  Fairly stable  Also neck injections

## 2019-11-29 ENCOUNTER — Ambulatory Visit: Payer: Commercial Managed Care - PPO | Admitting: Family Medicine

## 2019-11-29 ENCOUNTER — Other Ambulatory Visit: Payer: Self-pay

## 2019-11-29 VITALS — BP 130/85 | HR 60 | Temp 96.1°F | Ht 63.0 in | Wt 151.4 lb

## 2019-11-29 DIAGNOSIS — F4323 Adjustment disorder with mixed anxiety and depressed mood: Secondary | ICD-10-CM | POA: Diagnosis not present

## 2019-11-29 DIAGNOSIS — M4802 Spinal stenosis, cervical region: Secondary | ICD-10-CM

## 2019-11-29 DIAGNOSIS — I10 Essential (primary) hypertension: Secondary | ICD-10-CM

## 2019-11-29 DIAGNOSIS — G43709 Chronic migraine without aura, not intractable, without status migrainosus: Secondary | ICD-10-CM | POA: Diagnosis not present

## 2019-11-29 MED ORDER — DULOXETINE HCL 30 MG PO CPEP: 30.0000 mg | ORAL_CAPSULE | Freq: Every day | ORAL | 11 refills | Status: DC

## 2019-11-29 MED ORDER — TIZANIDINE HCL 4 MG PO TABS: 4.0000 mg | ORAL_TABLET | Freq: Three times a day (TID) | ORAL | 0 refills | Status: DC | PRN

## 2019-11-29 NOTE — Patient Instructions (Signed)
Go back to work as planned on Monday   Get your neck injection   Glad you are feeling better !

## 2019-11-29 NOTE — Progress Notes (Signed)
Subjective:    Patient ID: Laurie [name], female    DOB: 01/01/1966, 54 y.o.   MRN: [id number]  HPI Pt presents for f/u of chronic medical problems   Wt Readings from Last 3 Encounters:  11/29/19 151 lb 6 oz (68.7 kg)  11/15/19 148 lb 3 oz (67.2 kg)  11/01/19 145 lb 1 oz (65.8 kg)   26.81 kg/m   Blood pressure  BP Readings from Last 3 Encounters:  11/29/19 (!) 140/92  11/15/19 (!) 146/90  11/01/19 (!) 148/85   Taking amlodipine 2nd check BP: 130/85     Migraine - fair  Needs refill of tizanidine   CS stenosis  Has 2nd injection for neck on Tuesday- pain is pretty bad  Her R thumb is still numb  Neck pain     Anxiety/depression  Back on cymbalta - is feeling better  Still tired but mood seems to be better  Out of work - wants to go back Monday   Patient Active Problem List   Diagnosis Date Noted  . Hypertension 10/22/2019  . Hypokalemia 02/26/2019  . Chronic migraine w/o aura w/o status migrainosus, not intractable 02/26/2019  . Elevated glucose 06/10/2018  . Hyperlipidemia 06/07/2018  . ADHD (attention deficit hyperactivity disorder), combined type 04/17/2018  . Lateral epicondylitis 04/17/2018  . Menopause syndrome 04/17/2018  . Poor concentration 03/06/2018  . IBS (irritable bowel syndrome) 09/04/2017  . Chest pain in adult 01/02/2017  . Adjustment disorder with mixed anxiety and depressed mood 05/25/2016  . Low back pain 03/04/2015  . Vitamin D deficiency 03/04/2015  . Chronic daily headache 02/19/2014  . Fatigue 04/27/2012  . Ovarian cyst 04/27/2012  . Insomnia 06/06/2007  . [place] DISEASE 06/05/2007  . Stress reaction 04/04/2007  . Cervical stenosis of spine 04/04/2007   Past Medical History:  Diagnosis Date  . Abnormal uterine bleeding   . Anemia   . Anxiety    situational  . Chronic neck pain    from disc dz  . Dysmenorrhea   . Endometriosis   . Fibroid   . IBS (irritable bowel syndrome)    diarrhea predominant  .  Insomnia    organic sleep disorder, failed multiple meds  . Migraine   . Urinary incontinence    Past Surgical History:  Procedure Laterality Date  . ABDOMINAL HYSTERECTOMY    . ABDOMINAL HYSTERECTOMY    . BREAST BIOPSY Right   . BREAST CYST ASPIRATION Right   . Deg C-S disk/spinal stenosis  06/2007  . DILATION AND CURETTAGE OF UTERUS    . ENDOMETRIAL ABLATION    . epidural injections    . KNEE SURGERY    . PELVIC LAPAROSCOPY    . TUBAL LIGATION     Social History   Tobacco Use  . Smoking status: Never Smoker  . Smokeless tobacco: Never Used  Substance Use Topics  . Alcohol use: Yes    Alcohol/week: 0.0 standard drinks    Comment: occ/rare  . Drug use: No   Family History  Problem Relation Age of Onset  . Heart disease Other        CAD  . Heart disease Other        massive MI  . COPD Father        Deceased, 45  . Heart disease Father        CAD  . Migraines Father   . Heart attack Father   . Migraines Brother   . Thyroid disease Mother  Living, 71  . Migraines Daughter   . Breast cancer Maternal Grandmother   . Heart attack Maternal Grandfather   . Heart disease Paternal Grandmother    Allergies  Allergen Reactions  . Latex Rash  . Codeine     REACTION: vomiting  . Codeine Phosphate     REACTION: vomiting  . Crestor [Rosuvastatin]     Muscle pain   . Lisinopril     Headache and severe nausea  . Penicillins     REACTION: urticaria (hives) Did it involve swelling of the face/tongue/throat, SOB, or low BP? No Did it involve sudden or severe rash/hives, skin peeling, or any reaction on the inside of your mouth or nose? No Did you need to seek medical attention at a hospital or doctor's office? No When did it last happen? If all above answers are "NO", may proceed with cephalosporin use.   . Statins     Muscle pain    Current Outpatient Medications on File Prior to Visit  Medication Sig Dispense Refill  . amLODipine (NORVASC) 5 MG  tablet Take 1 tablet (5 mg total) by mouth daily. 30 tablet 3  . doxepin (SINEQUAN) 50 MG capsule Take 1 capsule (50 mg total) by mouth at bedtime. 90 capsule 3  . metoprolol succinate (TOPROL-XL) 50 MG 24 hr tablet TAKE 1 TABLET (50 MG TOTAL) BY MOUTH DAILY. TAKE WITH OR IMMEDIATELY FOLLOWING A MEAL. 30 tablet 5  . oxyCODONE-acetaminophen (PERCOCET) 10-325 MG tablet Take 1 tablet by mouth every 6 (six) hours as needed for pain.     . rizatriptan (MAXALT) 10 MG tablet TAKE ONE TABLET BY MOUTH AS NEEDED FOR MIGRAINE. MAY REPEAT IN 2 HOURS IF NEEDED. APPOINTMENT NEEDED. 10 tablet 5  . zolpidem (AMBIEN) 10 MG tablet TAKE 1 TABLET(10 MG) BY MOUTH AT BEDTIME AS NEEDED (Patient taking differently: Take 10 mg by mouth at bedtime as needed for sleep. TAKE 1 TABLET(10 MG) BY MOUTH AT BEDTIME AS NEEDED) 90 tablet 0  . [DISCONTINUED] escitalopram (LEXAPRO) 20 MG tablet TAKE 1 TABLET BY MOUTH EVERY DAY (Patient not taking: Reported on 10/29/2019) 90 tablet 0  . [DISCONTINUED] lisdexamfetamine (VYVANSE) 70 MG capsule Take 1 capsule (70 mg total) by mouth daily. (Patient not taking: Reported on 10/29/2019) 30 capsule 0   No current facility-administered medications on file prior to visit.     Review of Systems  Constitutional: Positive for fatigue. Negative for activity change, appetite change, fever and unexpected weight change.  HENT: Negative for congestion, ear pain, rhinorrhea, sinus pressure and sore throat.   Eyes: Negative for pain, redness and visual disturbance.  Respiratory: Negative for cough, shortness of breath and wheezing.   Cardiovascular: Negative for chest pain and palpitations.  Gastrointestinal: Negative for abdominal pain, blood in stool, constipation and diarrhea.  Endocrine: Negative for polydipsia and polyuria.  Genitourinary: Negative for dysuria, frequency and urgency.  Musculoskeletal: Positive for neck pain. Negative for arthralgias, back pain and myalgias.  Skin: Negative for  pallor and rash.  Allergic/Immunologic: Negative for environmental allergies.  Neurological: Positive for headaches. Negative for dizziness and syncope.  Hematological: Negative for adenopathy. Does not bruise/bleed easily.  Psychiatric/Behavioral: Positive for dysphoric mood and sleep disturbance. Negative for decreased concentration and suicidal ideas. The patient is nervous/anxious.        Objective:   Physical Exam        Assessment & Plan:   Problem List Items Addressed This Visit      Cardiovascular and Mediastinum  Chronic migraine w/o aura w/o status migrainosus, not intractable    Fairly stable  Neck pain is worsening it -for injection soon  Refilled tizanidine which helps Continues neuro care       Relevant Medications   tiZANidine (ZANAFLEX) 4 MG tablet   DULoxetine (CYMBALTA) 30 MG capsule   Hypertension    bp in fair control at this time  BP Readings from Last 1 Encounters:  11/29/19 130/85   No changes needed Most recent labs reviewed  Disc lifstyle change with low sodium diet and exercise          Other   Cervical stenosis of spine    Injection upcoming on Tuesday Hopes for relief  Pain causes HA and interferes with sleep      Adjustment disorder with mixed anxiety and depressed mood - Primary    Some improvement with cymbalta now (30 mg) She wants to continue this dose  Less sad and anxious  Reviewed stressors/ coping techniques/symptoms/ support sources/ tx options and side effects in detail today  Psychiatry consult pending

## 2019-11-30 NOTE — Assessment & Plan Note (Signed)
Fairly stable  Neck pain is worsening it -for injection soon  Refilled tizanidine which helps Continues neuro care

## 2019-11-30 NOTE — Assessment & Plan Note (Signed)
Injection upcoming on Tuesday Hopes for relief  Pain causes HA and interferes with sleep

## 2019-11-30 NOTE — Assessment & Plan Note (Signed)
bp in fair control at this time  BP Readings from Last 1 Encounters:  11/29/19 130/85   No changes needed Most recent labs reviewed  Disc lifstyle change with low sodium diet and exercise

## 2019-11-30 NOTE — Assessment & Plan Note (Signed)
Some improvement with cymbalta now (30 mg) She wants to continue this dose  Less sad and anxious  Reviewed stressors/ coping techniques/symptoms/ support sources/ tx options and side effects in detail today  Psychiatry consult pending

## 2019-12-09 ENCOUNTER — Encounter: Payer: Self-pay | Admitting: Family Medicine

## 2019-12-11 ENCOUNTER — Other Ambulatory Visit: Payer: Self-pay | Admitting: Family Medicine

## 2019-12-27 ENCOUNTER — Telehealth: Payer: Self-pay

## 2019-12-27 ENCOUNTER — Encounter: Payer: Self-pay | Admitting: Family Medicine

## 2019-12-27 MED ORDER — BUTALBITAL-APAP-CAFFEINE 50-325-40 MG PO TABS: 1.0000 | ORAL_TABLET | Freq: Two times a day (BID) | ORAL | 0 refills | Status: DC | PRN

## 2019-12-27 NOTE — Telephone Encounter (Signed)
Patient advised and said thank you

## 2019-12-27 NOTE — Telephone Encounter (Signed)
Please let her know that I did refill a few in Dr Marliss Coots absence

## 2019-12-27 NOTE — Telephone Encounter (Signed)
Patient sent this mychart message and was told to call and ask about this instead, message is below: Can you please review for Dr Glori Bickers, thank you:  Dr. Glori Bickers I hope you're doing well. Can you refill my fioricet for me. I'm having a lot of headaches/neck pain the muscle relaxers help at night but I can't take during the day and work. I've been working remotely but I have to go back in the office next week for 2 weeks. I've had a headache the past 3 days. I will take them sparingly, I know you don't like me being on them but them seem to be the only thing that will get rid of these headaches sometimes. If you need to call me [phone]. If you can [facility] [address]. Thank  you.  This medication is no longer in her chart but looking at past history this was refilled in the past:butalbital-acetaminophen-caffeine (FIORICET) 50-325-40 . Thank you

## 2019-12-31 ENCOUNTER — Other Ambulatory Visit: Payer: Self-pay | Admitting: *Deleted

## 2019-12-31 MED ORDER — BUTALBITAL-APAP-CAFFEINE 50-325-40 MG PO TABS: 1.0000 | ORAL_TABLET | Freq: Two times a day (BID) | ORAL | 0 refills | Status: DC | PRN

## 2019-12-31 NOTE — Telephone Encounter (Signed)
Dr. Silvio Pate refilled #10 tabs in Dr. Marliss Coots absence on 12/27/19, last OV was a f/u on 11/29/19, please advise   [facility] [place], [place]

## 2020-01-01 ENCOUNTER — Encounter: Payer: Self-pay | Admitting: Family Medicine

## 2020-01-01 NOTE — Telephone Encounter (Signed)
Pt called office for an apt and call was transferred to this nurse. Pt c/o bilateral foot edema, worse in the left foot than the right. No swelling in the AM but starts swelling in the PM. Pt also reports about one week ago she did have L calf pain that felt like a muscle pull and was concerned it was a blood clot. Pt reports the pain is no longer there and stopped about 3 days ago. She reports she was told one of her BP meds could cause edema but she is still worried. Pt reports she has also had SOB with exertion only since the edema started. Pt also reports she has been working from home over the last year and is out of shape and has gained some weight. Pt denied SOB, chest pain, or leg pain on the phone. Advised pt of ER protocol and that a msg would be sent to her PCP and scheduled pt for apt tomorrow. Pt verbalized understanding.

## 2020-01-02 ENCOUNTER — Telehealth: Payer: Self-pay | Admitting: Family Medicine

## 2020-01-02 ENCOUNTER — Ambulatory Visit: Payer: Commercial Managed Care - PPO | Admitting: Family Medicine

## 2020-01-02 DIAGNOSIS — Z0289 Encounter for other administrative examinations: Secondary | ICD-10-CM

## 2020-01-02 NOTE — Telephone Encounter (Signed)
Pt called stating she could not get off work to come today 01/02/20  @ 10.  She r/s to Monday 3/22 @ 8am in office .  She wanted virtual but shapale stated it would be better to come in office.  Pt wanted to know if she could skip amlodipine until see in office on 3/22 and double metoprolol   Please advise  Pt stated she is so sorry she missed her appointment .  She could not get off work.

## 2020-01-02 NOTE — Telephone Encounter (Signed)
That is feasible as long as her resting HR is not under 69.   Also, warn her that the metoprolol can also cause swelling  (depends on the person)     Hold amlodipine Increase metoprolol xl to two 50 mg tabs once daily  F/u as planned

## 2020-01-02 NOTE — Telephone Encounter (Signed)
Pt notified of Dr. Tower's instructions and verbalized understanding  

## 2020-01-06 ENCOUNTER — Encounter: Payer: Self-pay | Admitting: Family Medicine

## 2020-01-06 ENCOUNTER — Ambulatory Visit (INDEPENDENT_AMBULATORY_CARE_PROVIDER_SITE_OTHER): Payer: Commercial Managed Care - PPO | Admitting: Family Medicine

## 2020-01-06 ENCOUNTER — Other Ambulatory Visit: Payer: Self-pay

## 2020-01-06 VITALS — BP 118/80 | HR 60 | Temp 97.8°F | Ht 63.0 in | Wt 150.0 lb

## 2020-01-06 DIAGNOSIS — F902 Attention-deficit hyperactivity disorder, combined type: Secondary | ICD-10-CM

## 2020-01-06 DIAGNOSIS — I10 Essential (primary) hypertension: Secondary | ICD-10-CM | POA: Diagnosis not present

## 2020-01-06 DIAGNOSIS — M4802 Spinal stenosis, cervical region: Secondary | ICD-10-CM | POA: Diagnosis not present

## 2020-01-06 DIAGNOSIS — F4323 Adjustment disorder with mixed anxiety and depressed mood: Secondary | ICD-10-CM

## 2020-01-06 MED ORDER — LISDEXAMFETAMINE DIMESYLATE 20 MG PO CAPS: 20.0000 mg | ORAL_CAPSULE | Freq: Every day | ORAL | 0 refills | Status: DC

## 2020-01-06 MED ORDER — METOPROLOL SUCCINATE ER 50 MG PO TB24: 100.0000 mg | ORAL_TABLET | Freq: Every day | ORAL | 5 refills | Status: DC

## 2020-01-06 NOTE — Assessment & Plan Note (Signed)
Now that BP is better pt wants to try vyvanse and monitor closely  Will start with 20 mg and titrate up if needed carefully  Continue to monitor BP and pulse

## 2020-01-06 NOTE — Progress Notes (Signed)
Subjective:    Patient ID: Laurie [name], female    DOB: 1965/11/13, 54 y.o.   MRN: [id number]  HPI Pt presents with HTN having issues with pedal edema   Wt Readings from Last 3 Encounters:  01/06/20 150 lb (68 kg)  11/29/19 151 lb 6 oz (68.7 kg)  11/15/19 148 lb 3 oz (67.2 kg)   26.57 kg/m   She started having edema after starting amlodipine  Worse as the day went up  R ankle was worse than the L     She was previously taking metoprolol xl 50 mg  Amlodipine 5 mg daily  Had pedal edema   Was inst to hold the amlodipine and double the amlodipine due to pedal edema  BP Readings from Last 3 Encounters:  01/06/20 118/80  11/29/19 130/85  11/15/19 (!) 146/90   Pulse Readings from Last 3 Encounters:  01/06/20 60  11/29/19 60  11/15/19 (!) 108   Has not taken it yet today  Usually 120s/80s   Swelling is gone   Usually takes metoprolol after she gets to work  It upsets stomach a little - can take with food     Lab Results  Component Value Date   CREATININE 0.60 10/29/2019   BUN 9 10/29/2019   NA 142 10/29/2019   K 3.7 10/29/2019   CL 104 10/29/2019   CO2 27 10/29/2019   Lab Results  Component Value Date   CREATININE 0.60 10/29/2019   BUN 9 10/29/2019   NA 142 10/29/2019   K 3.7 10/29/2019   CL 104 10/29/2019   CO2 27 10/29/2019   Mood - is better  Came off the cymbalta (2 weeks)  Is less tired off of it  Mood is ok - much better and does not need it   In the past vyvanse caused inc bp   It really helped her ADD  She is struggling with concentration at her job again  [place] concentrating in the office   Cannot see psychiatry because she cannot leave work for appts  Headaches are doing better overall   Neck pain - is considering surgery (injections are helping a little bit)    Patient Active Problem List   Diagnosis Date Noted  . Hypertension 10/22/2019  . Hypokalemia 02/26/2019  . Chronic migraine w/o aura w/o status migrainosus, not  intractable 02/26/2019  . Elevated glucose 06/10/2018  . Hyperlipidemia 06/07/2018  . ADHD (attention deficit hyperactivity disorder), combined type 04/17/2018  . Lateral epicondylitis 04/17/2018  . Menopause syndrome 04/17/2018  . Poor concentration 03/06/2018  . IBS (irritable bowel syndrome) 09/04/2017  . Chest pain in adult 01/02/2017  . Adjustment disorder with mixed anxiety and depressed mood 05/25/2016  . Low back pain 03/04/2015  . Vitamin D deficiency 03/04/2015  . Chronic daily headache 02/19/2014  . Fatigue 04/27/2012  . Ovarian cyst 04/27/2012  . Insomnia 06/06/2007  . [place]iler DISEASE 06/05/2007  . Stress reaction 04/04/2007  . Cervical stenosis of spine 04/04/2007   Past Medical History:  Diagnosis Date  . Abnormal uterine bleeding   . Anemia   . Anxiety    situational  . Chronic neck pain    from disc dz  . Dysmenorrhea   . Endometriosis   . Fibroid   . IBS (irritable bowel syndrome)    diarrhea predominant  . Insomnia    organic sleep disorder, failed multiple meds  . Migraine   . Urinary incontinence    Past Surgical History:  Procedure Laterality Date  . ABDOMINAL HYSTERECTOMY    . ABDOMINAL HYSTERECTOMY    . BREAST BIOPSY Right   . BREAST CYST ASPIRATION Right   . Deg C-S disk/spinal stenosis  06/2007  . DILATION AND CURETTAGE OF UTERUS    . ENDOMETRIAL ABLATION    . epidural injections    . KNEE SURGERY    . PELVIC LAPAROSCOPY    . TUBAL LIGATION     Social History   Tobacco Use  . Smoking status: Never Smoker  . Smokeless tobacco: Never Used  Substance Use Topics  . Alcohol use: Yes    Alcohol/week: 0.0 standard drinks    Comment: occ/rare  . Drug use: No   Family History  Problem Relation Age of Onset  . Heart disease Other        CAD  . Heart disease Other        massive MI  . COPD Father        Deceased, 54  . Heart disease Father        CAD  . Migraines Father   . Heart attack Father   . Migraines Brother     . Thyroid disease Mother        Living, 48  . Migraines Daughter   . Breast cancer Maternal Grandmother   . Heart attack Maternal Grandfather   . Heart disease Paternal Grandmother    Allergies  Allergen Reactions  . Latex Rash  . Amlodipine     Pedal edema   . Codeine     REACTION: vomiting  . Codeine Phosphate     REACTION: vomiting  . Crestor [Rosuvastatin]     Muscle pain   . Lisinopril     Headache and severe nausea  . Penicillins     REACTION: urticaria (hives) Did it involve swelling of the face/tongue/throat, SOB, or low BP? No Did it involve sudden or severe rash/hives, skin peeling, or any reaction on the inside of your mouth or nose? No Did you need to seek medical attention at a hospital or doctor's office? No When did it last happen? If all above answers are "NO", may proceed with cephalosporin use.   . Statins     Muscle pain    Current Outpatient Medications on File Prior to Visit  Medication Sig Dispense Refill  . butalbital-acetaminophen-caffeine (FIORICET) 50-325-40 MG tablet Take 1 tablet by mouth 2 (two) times daily as needed for headache. 15 tablet 0  . doxepin (SINEQUAN) 50 MG capsule Take 1 capsule (50 mg total) by mouth at bedtime. 90 capsule 3  . oxyCODONE-acetaminophen (PERCOCET) 10-325 MG tablet Take 1 tablet by mouth every 6 (six) hours as needed for pain.     . rizatriptan (MAXALT) 10 MG tablet TAKE ONE TABLET BY MOUTH AS NEEDED FOR MIGRAINE. MAY REPEAT IN 2 HOURS IF NEEDED. APPOINTMENT NEEDED. 10 tablet 5  . tiZANidine (ZANAFLEX) 4 MG tablet Take 1 tablet (4 mg total) by mouth 3 (three) times daily as needed for muscle spasms. 30 tablet 0  . zolpidem (AMBIEN) 10 MG tablet TAKE 1 TABLET(10 MG) BY MOUTH AT BEDTIME AS NEEDED (Patient taking differently: Take 10 mg by mouth at bedtime as needed for sleep. TAKE 1 TABLET(10 MG) BY MOUTH AT BEDTIME AS NEEDED) 90 tablet 0  . [DISCONTINUED] escitalopram (LEXAPRO) 20 MG tablet TAKE 1 TABLET BY MOUTH  EVERY DAY (Patient not taking: Reported on 10/29/2019) 90 tablet 0   No current facility-administered medications on file  prior to visit.    Review of Systems  Constitutional: Positive for fatigue. Negative for activity change, appetite change, fever and unexpected weight change.  HENT: Negative for congestion, ear pain, rhinorrhea, sinus pressure and sore throat.   Eyes: Negative for pain, redness and visual disturbance.  Respiratory: Negative for cough, shortness of breath and wheezing.   Cardiovascular: Negative for chest pain, palpitations and leg swelling.       Leg swelling is better off amlodipine   Gastrointestinal: Negative for abdominal pain, blood in stool, constipation and diarrhea.  Endocrine: Negative for polydipsia and polyuria.  Genitourinary: Negative for dysuria, frequency and urgency.  Musculoskeletal: Negative for arthralgias, back pain and myalgias.  Skin: Negative for pallor and rash.  Allergic/Immunologic: Negative for environmental allergies.  Neurological: Negative for dizziness, syncope and headaches.  Hematological: Negative for adenopathy. Does not bruise/bleed easily.  Psychiatric/Behavioral: Positive for decreased concentration. Negative for dysphoric mood. The patient is not nervous/anxious.        Objective:   Physical Exam Constitutional:      General: She is not in acute distress.    Appearance: Normal appearance. She is well-developed and normal weight. She is not ill-appearing or diaphoretic.  HENT:     Head: Normocephalic and atraumatic.  Eyes:     Conjunctiva/sclera: Conjunctivae normal.     Pupils: Pupils are equal, round, and reactive to light.  Neck:     Thyroid: No thyromegaly.     Vascular: No carotid bruit or JVD.  Cardiovascular:     Rate and Rhythm: Normal rate and regular rhythm.     Heart sounds: Normal heart sounds. No gallop.   Pulmonary:     Effort: Pulmonary effort is normal. No respiratory distress.     Breath sounds: Normal  breath sounds. No wheezing or rales.  Abdominal:     General: Bowel sounds are normal. There is no distension or abdominal bruit.     Palpations: Abdomen is soft. There is no mass.     Tenderness: There is no abdominal tenderness.  Musculoskeletal:     Cervical back: Normal range of motion and neck supple.     Right lower leg: No edema.     Left lower leg: No edema.  Lymphadenopathy:     Cervical: No cervical adenopathy.  Skin:    General: Skin is warm and dry.     Findings: No erythema or rash.  Neurological:     Mental Status: She is alert.     Coordination: Coordination normal.     Deep Tendon Reflexes: Reflexes are normal and symmetric. Reflexes normal.  Psychiatric:        Attention and Perception: Attention normal.        Mood and Affect: Mood normal.     Comments: Improved mood  Voices trouble with attention and concentration           Assessment & Plan:   Problem List Items Addressed This Visit      Cardiovascular and Mediastinum   Hypertension - Primary    bp in fair control at this time  BP Readings from Last 1 Encounters:  01/06/20 118/80   No changes needed Most recent labs reviewed  Disc lifstyle change with low sodium diet and exercise  Doing well with metoprolol xl (total dose 100) - but need to watch for hypotension and low pulse Pt will monitor       Relevant Medications   metoprolol succinate (TOPROL-XL) 50 MG 24 hr tablet  Other   Cervical stenosis of spine    Continues to be quite problematic Per pt- considering surgery      Adjustment disorder with mixed anxiety and depressed mood    Pt is overall doing better and stopped cymbalta  (it was causing sleepiness also)  She declined psychiatry ref but will need to re consider if symptoms worsen again       ADHD (attention deficit hyperactivity disorder), combined type    Now that BP is better pt wants to try vyvanse and monitor closely  Will start with 20 mg and titrate up if needed  carefully  Continue to monitor BP and pulse

## 2020-01-06 NOTE — Assessment & Plan Note (Signed)
bp in fair control at this time  BP Readings from Last 1 Encounters:  01/06/20 118/80   No changes needed Most recent labs reviewed  Disc lifstyle change with low sodium diet and exercise  Doing well with metoprolol xl (total dose 100) - but need to watch for hypotension and low pulse Pt will monitor

## 2020-01-06 NOTE — Assessment & Plan Note (Signed)
Pt is overall doing better and stopped cymbalta  (it was causing sleepiness also)  She declined psychiatry ref but will need to re consider if symptoms worsen again

## 2020-01-06 NOTE — Patient Instructions (Addendum)
Watch blood pressure and pulse very closely  Continue metoprolol xl 50 mg   2 pills once daily   If pulse goes below 50 let me know  Know that metoprolol will keep pulse lower (it will not go up as much with exercise)  Watch for dizziness and low BP   Try low dose vyvanse 20 mg daily

## 2020-01-06 NOTE — Assessment & Plan Note (Signed)
Continues to be quite problematic Per pt- considering surgery

## 2020-01-13 ENCOUNTER — Other Ambulatory Visit: Payer: Self-pay | Admitting: Family Medicine

## 2020-01-14 ENCOUNTER — Other Ambulatory Visit: Payer: Self-pay | Admitting: Family Medicine

## 2020-01-14 MED ORDER — ZOLPIDEM TARTRATE 10 MG PO TABS: ORAL_TABLET | ORAL | 0 refills | Status: DC

## 2020-01-14 NOTE — Telephone Encounter (Signed)
Name of [place] Name of Pharmacy:[facility] or Written Date and Quantity:10/14/19 #90 tabs 0 refills  Last Office Visit and Type:01/06/20 for Foot swelling Next Office Visit and Type:none scheduled Last Controlled Substance Agreement Date:not done Last UDS:not done

## 2020-01-20 ENCOUNTER — Encounter: Payer: Self-pay | Admitting: Family Medicine

## 2020-01-22 ENCOUNTER — Other Ambulatory Visit: Payer: Self-pay | Admitting: Family Medicine

## 2020-01-22 NOTE — Telephone Encounter (Signed)
Last filled on 02/26/19 #90 caps with 3 refills, last OV was for feet swelling on 01/06/20

## 2020-03-22 ENCOUNTER — Other Ambulatory Visit: Payer: Self-pay | Admitting: Family Medicine

## 2020-03-23 NOTE — Telephone Encounter (Signed)
Med isn't on med list anymore. Last filled on 01/10/2019 #90 with 1 refill, last OV on 01/06/20 for swollen feet, please advise

## 2020-04-08 ENCOUNTER — Other Ambulatory Visit: Payer: Self-pay

## 2020-04-08 NOTE — Telephone Encounter (Signed)
Send back to me Friday and I will fill then Thanks

## 2020-04-08 NOTE — Telephone Encounter (Addendum)
Pt left v/m requesting [facility] [address]. Pt request to be done today. I called pt last refilled # 90 on 01/14/20; pt thought she was out of med almost but when she gets home tonight will ck qty. Advised should have med to last at least 5 more days. Pt voiced understanding.   Pt called back and thinks she got filled on 03/11/20. I spoke with Tammy at [facility] and tammy said pts ins will not allow 3 mth filling. Tammy said pt got # 30 on 01/15/20; Got # 30 on 02/12/20 And got # 30 on 03/11/20.  [facility] said [facility] allows 2 days early fill on # 30 and # 90 rx. Pt got 3 days early on 02/12/20 and 2 days early on 03/11/20 and that would account for pt having 5 pills left. Pt said she only has 2 pills left; pt takes med as prescribed and could have taken an extra in last 3 mths to help her sleep but pt does not remember doing that. Pt request ambien filled before next wk. FYI to Dr tower and [place] CMA.  Name of Medication: ambien 10 mg Name of [facility]: [facility] or Written Date and Quantity:# 58 on 01/14/20  Last Office Visit and Type:01/06/20 HTN & Pedal edema  Next Office Visit and Type: none scheduled

## 2020-04-09 ENCOUNTER — Other Ambulatory Visit: Payer: Self-pay | Admitting: Family Medicine

## 2020-04-10 MED ORDER — ZOLPIDEM TARTRATE 10 MG PO TABS: ORAL_TABLET | ORAL | 0 refills | Status: DC

## 2020-07-05 ENCOUNTER — Other Ambulatory Visit: Payer: Self-pay | Admitting: Family Medicine

## 2020-07-06 NOTE — Telephone Encounter (Signed)
LAST APPOINTMENT DATE: 01/06/2020  NEXT APPOINTMENT DATE: Visit date not found    LAST REFILL: 04/10/2020  QTY: #90 no rf

## 2020-07-07 NOTE — Telephone Encounter (Signed)
I think this is a few days early?

## 2020-07-10 ENCOUNTER — Other Ambulatory Visit: Payer: Self-pay | Admitting: Family Medicine

## 2020-07-10 NOTE — Telephone Encounter (Signed)
Name of Medication: ambien 10 mg Name of Pharmacy: [facility] or Written Date and Quantity:# 12 on 04/10/20  Last Office Visit and Type:01/06/20 HTN & Pedal edema  Next Office Visit and Type: none scheduled

## 2020-07-29 ENCOUNTER — Other Ambulatory Visit: Payer: Self-pay | Admitting: Neurology

## 2020-08-17 ENCOUNTER — Other Ambulatory Visit: Payer: Self-pay | Admitting: Neurology

## 2020-08-20 ENCOUNTER — Telehealth: Payer: Self-pay | Admitting: Neurology

## 2020-08-20 MED ORDER — RIZATRIPTAN BENZOATE 10 MG PO TABS: ORAL_TABLET | ORAL | 11 refills | Status: DC

## 2020-08-20 NOTE — Telephone Encounter (Signed)
Called patient and left a message for a call back.  

## 2020-08-20 NOTE — Telephone Encounter (Signed)
Refill sent for Maxalt. Regarding her worsening headaches, please have her keep a headache journal with frequency and symptoms, so we can review at the next visit.

## 2020-08-20 NOTE — Telephone Encounter (Signed)
Patient called and requested refills on her Maxalt 10 MG to last until her next appointment 10/15/20 with Dr. Posey Pronto.  She also said, "I've been having migraines and stress headaches so badly lately. When it happens I can feel my face being pulled down. I don't know if Dr. Posey Pronto has any advice about this but I'd like to know."  [facility] [address]

## 2020-08-21 NOTE — Telephone Encounter (Signed)
Called patient and left message for a call back.  

## 2020-08-24 NOTE — Telephone Encounter (Signed)
Called patient and left a message for a call back.  

## 2020-09-01 ENCOUNTER — Encounter: Payer: Self-pay | Admitting: Family Medicine

## 2020-09-02 MED ORDER — LISDEXAMFETAMINE DIMESYLATE 40 MG PO CAPS: 40.0000 mg | ORAL_CAPSULE | ORAL | 0 refills | Status: DC

## 2020-09-02 NOTE — Addendum Note (Signed)
Addended by: Loura Pardon A on: 09/02/2020 09:21 PM   Modules accepted: Orders

## 2020-09-02 NOTE — Telephone Encounter (Signed)
See mychart message. Ambien was last filled on 07/10/20 but given a 3 month supply #90 tabs with 0 refills (if to soon)  vyvanse was last filled on 01/06/20 #30 tabs 0 refills but pt is requesting higher dose  Last OV was for swollen feet on 01/06/20, no future appts

## 2020-09-06 ENCOUNTER — Other Ambulatory Visit: Payer: Self-pay | Admitting: Family Medicine

## 2020-09-11 ENCOUNTER — Encounter: Payer: Self-pay | Admitting: Family Medicine

## 2020-09-14 MED ORDER — ZOLPIDEM TARTRATE 10 MG PO TABS: ORAL_TABLET | ORAL | 0 refills | Status: DC

## 2020-09-14 NOTE — Telephone Encounter (Signed)
Printed a control sub report for PCP to review

## 2020-09-22 ENCOUNTER — Encounter: Payer: Self-pay | Admitting: Family Medicine

## 2020-09-22 MED ORDER — AMPHETAMINE-DEXTROAMPHETAMINE 30 MG PO TABS
30.0000 mg | ORAL_TABLET | Freq: Two times a day (BID) | ORAL | 0 refills | Status: DC
Start: 2020-09-22 — End: 2021-09-20

## 2020-09-22 NOTE — Telephone Encounter (Signed)
Which pharmacy? She has several on file Thanks

## 2020-09-22 NOTE — Telephone Encounter (Signed)
See pt's mychart response with pharmacy

## 2020-09-22 NOTE — Telephone Encounter (Signed)
See mychart message. [facility] and confirmed with pharmacist that pt didn't pick up Rx. I cancelled it over the phone with them, if you want to send in a new med now you can.

## 2020-09-29 ENCOUNTER — Other Ambulatory Visit: Payer: Self-pay | Admitting: Neurology

## 2020-09-29 ENCOUNTER — Other Ambulatory Visit: Payer: Self-pay | Admitting: Family Medicine

## 2020-10-15 ENCOUNTER — Ambulatory Visit: Payer: Commercial Managed Care - PPO | Admitting: Neurology

## 2020-10-15 ENCOUNTER — Encounter: Payer: Self-pay | Admitting: Neurology

## 2020-10-15 DIAGNOSIS — Z029 Encounter for administrative examinations, unspecified: Secondary | ICD-10-CM

## 2020-11-27 ENCOUNTER — Ambulatory Visit: Payer: Commercial Managed Care - PPO | Admitting: Family Medicine

## 2020-11-27 DIAGNOSIS — Z0289 Encounter for other administrative examinations: Secondary | ICD-10-CM

## 2020-12-16 ENCOUNTER — Other Ambulatory Visit: Payer: Self-pay | Admitting: Family Medicine

## 2020-12-16 NOTE — Telephone Encounter (Signed)
Refill request for Ambien 10 mg tablets  LOV - 01/06/20 Next OV - not scheduled Last refilled - 09/14/20 #90/0

## 2020-12-23 ENCOUNTER — Other Ambulatory Visit: Payer: Self-pay | Admitting: Family Medicine

## 2021-03-09 ENCOUNTER — Other Ambulatory Visit: Payer: Self-pay | Admitting: Family Medicine

## 2021-03-13 ENCOUNTER — Other Ambulatory Visit: Payer: Self-pay | Admitting: Family Medicine

## 2021-03-16 NOTE — Telephone Encounter (Signed)
Requesting: Ambien Contract: none UDS: none Last Visit: 01/06/2020 Next Visit: None Last Refill: 12/16/20- 90, 0

## 2021-06-13 ENCOUNTER — Encounter: Payer: Self-pay | Admitting: Family Medicine

## 2021-06-14 NOTE — Telephone Encounter (Signed)
I will refill 90  She has multiple pharmacies -please ask which one so I can send it   Schedule a f/u with me in 90 days

## 2021-06-14 NOTE — Telephone Encounter (Signed)
See mychart message. Pt No-showed last OV visit on 11/27/20, no future appts and last OV was on 01/06/20,    Ambien last filled on 03/16/21 #90 tabs/ 0 refills

## 2021-06-15 ENCOUNTER — Other Ambulatory Visit: Payer: Self-pay | Admitting: Family Medicine

## 2021-06-16 ENCOUNTER — Other Ambulatory Visit: Payer: Self-pay | Admitting: Family Medicine

## 2021-06-16 MED ORDER — ZOLPIDEM TARTRATE 10 MG PO TABS
ORAL_TABLET | ORAL | 0 refills | Status: DC
Start: 2021-06-16 — End: 2021-07-27

## 2021-06-16 NOTE — Telephone Encounter (Signed)
I in fact did not see that and sent to there wrong pharmacy/I think.  Please cancel and let me know -send back and [facility] will [facility] Sorry about that

## 2021-06-16 NOTE — Telephone Encounter (Signed)
Pt notified of DR. Tower's comments. Pt will get f/u scheduled once she knows her work scheduled. (Starting Sept 12), please send to [place]

## 2021-06-17 MED ORDER — ZOLPIDEM TARTRATE 10 MG PO TABS
ORAL_TABLET | ORAL | 0 refills | Status: DC
Start: 2021-06-17 — End: 2021-09-14

## 2021-06-17 NOTE — Telephone Encounter (Signed)
Rx cancelled please resend to the correct pharmacy

## 2021-07-27 ENCOUNTER — Encounter: Payer: Self-pay | Admitting: Family Medicine

## 2021-07-27 MED ORDER — BUTALBITAL-APAP-CAFFEINE 50-325-40 MG PO TABS: 1.0000 | ORAL_TABLET | Freq: Two times a day (BID) | ORAL | 0 refills | Status: DC | PRN

## 2021-07-27 NOTE — Telephone Encounter (Signed)
See mychart message, last OV was swollen feet on 01/06/20, pt no-showed f/u on 11/27/20, no future appts. Fioricet last filled on 12/31/19 #15 tabs with 0 refills

## 2021-07-27 NOTE — Telephone Encounter (Signed)
Patient called in wanted to know about getting medicine due to she is having a really bad headaches and its making her sick. Offered vv but she doesn't have insurance and she stated that Dr. Glori Bickers has sent it in before

## 2021-09-13 ENCOUNTER — Other Ambulatory Visit: Payer: Self-pay | Admitting: Family Medicine

## 2021-09-13 ENCOUNTER — Encounter: Payer: Self-pay | Admitting: Family Medicine

## 2021-09-14 NOTE — Telephone Encounter (Signed)
Pt called in stated she need's RX zolpidem (AMBIEN) 10 MG tablet be [id number] [place] [place] [address]

## 2021-09-14 NOTE — Telephone Encounter (Signed)
Last OV was for swelling on 01/06/20. Please see mychart message pt recently sent. I have responded but so far pt hasn't viewed it or responded regarding virtual visit so no future appts.  Last filled on 06/17/21 #90 tabs with 0 refills

## 2021-09-14 NOTE — Telephone Encounter (Signed)
Pt sent another mychart regarding me telling her to check with her insurance regarding a virtual med refill visit. Pt's response was:  Hello, they don't cover any of our office visits. I'm gonna have to pay cash. That's why I was hoping it would be cheaper to do a virtual visit. Can she call in my refill for Ambien and I will definitely get visit scheduled. I've got the flu right now and I'm miserable/can't sleep.

## 2021-09-14 NOTE — Telephone Encounter (Signed)
Pt called back and schedule a virtual med refill appt for 09/20/21

## 2021-09-20 ENCOUNTER — Other Ambulatory Visit: Payer: Self-pay

## 2021-09-20 ENCOUNTER — Telehealth (INDEPENDENT_AMBULATORY_CARE_PROVIDER_SITE_OTHER): Payer: Commercial Managed Care - PPO | Admitting: Family Medicine

## 2021-09-20 ENCOUNTER — Encounter: Payer: Self-pay | Admitting: Family Medicine

## 2021-09-20 VITALS — BP 128/82 | Wt 136.0 lb

## 2021-09-20 DIAGNOSIS — G4700 Insomnia, unspecified: Secondary | ICD-10-CM | POA: Diagnosis not present

## 2021-09-20 DIAGNOSIS — F902 Attention-deficit hyperactivity disorder, combined type: Secondary | ICD-10-CM | POA: Diagnosis not present

## 2021-09-20 DIAGNOSIS — G43709 Chronic migraine without aura, not intractable, without status migrainosus: Secondary | ICD-10-CM | POA: Diagnosis not present

## 2021-09-20 DIAGNOSIS — J0100 Acute maxillary sinusitis, unspecified: Secondary | ICD-10-CM | POA: Diagnosis not present

## 2021-09-20 DIAGNOSIS — J019 Acute sinusitis, unspecified: Secondary | ICD-10-CM | POA: Insufficient documentation

## 2021-09-20 DIAGNOSIS — I10 Essential (primary) hypertension: Secondary | ICD-10-CM

## 2021-09-20 MED ORDER — AMPHETAMINE-DEXTROAMPHETAMINE 30 MG PO TABS: 30.0000 mg | ORAL_TABLET | Freq: Two times a day (BID) | ORAL | 0 refills | Status: DC

## 2021-09-20 MED ORDER — AZITHROMYCIN 250 MG PO TABS: ORAL_TABLET | ORAL | 0 refills | Status: DC

## 2021-09-20 MED ORDER — METOPROLOL SUCCINATE ER 50 MG PO TB24
100.0000 mg | ORAL_TABLET | Freq: Every day | ORAL | 5 refills | Status: DC
Start: 2021-09-20 — End: 2021-12-06

## 2021-09-20 NOTE — Assessment & Plan Note (Signed)
Improved with adderall 30 mg bid Cannot always afford to get it  Wants refill today  Will help starting new job

## 2021-09-20 NOTE — Assessment & Plan Note (Signed)
S/p influenza a week ago  Persistent sinus pain with purulent mucous drainage and cough  Px azithromycin (she is pcn allergic) Fluids/rest Saline flonase-continue Update if not starting to improve in a week or if worsening  ER parameters discussed

## 2021-09-20 NOTE — Assessment & Plan Note (Signed)
Not currently seeing neurology  Fairly stable maxalt prn  Metoprolol for HTN also helps

## 2021-09-20 NOTE — Progress Notes (Signed)
Virtual Visit via Telephone Note  I connected with Laurie [name] on 09/20/21 at 12:00 PM EST by telephone and verified that I am speaking with the correct person using two identifiers.  Location: Patient: home/work Provider: office   I discussed the limitations, risks, security and privacy concerns of performing an evaluation and management service by telephone and the availability of in person appointments. I also discussed with the patient that there may be a patient responsible charge related to this service. The patient expressed understanding and agreed to proceed.  Parties involved in encounter  [place]  Provider:  Loura Pardon MD   History of Present Illness: Pt presents for f/u of chronic health problems and also cough/congestion after the flu  She has a high deductible and cannot come for office visit  New plan with a new job  Likes her new job  (paid hourly) [employer]  Will get the week of xmas off  Had the flu all last week  (was late getting her flu shot)  Caught from a child at thanksgiving  Had temp of 100.0 for 3 days and wiped out  Did test neg for covid  Thinks she has a sinus infection now  Very congested  Some headache  L eye/pressure and feels swollen- pain under the eye (no redness)  Mucous tastes like metal  Cough is mostly dry, a little phlegm today   Pcn allergic  Takes hydrocodone for neck  Otc:  Ibuprofen   Insomnia  Ambien 10 mg daily  It helps her fall asleep  1 cup coffee in am -no other caffeine   Also takes gabapentin 300 mg at night   ADD Adderall 30 mg bid Has not had it in a while  Had a few pills left   HTN BP Readings from Last 3 Encounters:  09/20/21 128/82  01/06/20 118/80  11/29/19 130/85   Metoprolol xl 100 mg daily  Migraine-prev saw neurology but then lost her insurance Maxalt Metoprolol  Zanaflex fiorcet  She goes to [facility] for her chronic pain issues   Patient Active Problem List    Diagnosis Date Noted   Acute sinusitis 09/20/2021   Hypertension 10/22/2019   Hypokalemia 02/26/2019   Chronic migraine w/o aura w/o status migrainosus, not intractable 02/26/2019   Elevated glucose 06/10/2018   Hyperlipidemia 06/07/2018   ADHD (attention deficit hyperactivity disorder), combined type 04/17/2018   Lateral epicondylitis 04/17/2018   Menopause syndrome 04/17/2018   Poor concentration 03/06/2018   IBS (irritable bowel syndrome) 09/04/2017   Chest pain in adult 01/02/2017   Adjustment disorder with mixed anxiety and depressed mood 05/25/2016   Low back pain 03/04/2015   Vitamin D deficiency 03/04/2015   Chronic daily headache 02/19/2014   Fatigue 04/27/2012   Ovarian cyst 04/27/2012   Insomnia 06/06/2007   DEGENERATIVE DISC DISEASE 06/05/2007   Stress reaction 04/04/2007   Cervical stenosis of spine 04/04/2007   Past Medical History:  Diagnosis Date   Abnormal uterine bleeding    Anemia    Anxiety    situational   Chronic neck pain    from disc dz   Dysmenorrhea    Endometriosis    Fibroid    IBS (irritable bowel syndrome)    diarrhea predominant   Insomnia    organic sleep disorder, failed multiple meds   Migraine    Urinary incontinence    Past Surgical History:  Procedure Laterality Date   ABDOMINAL HYSTERECTOMY     ABDOMINAL HYSTERECTOMY  BREAST BIOPSY Right    BREAST CYST ASPIRATION Right    Deg C-S disk/spinal stenosis  06/2007   DILATION AND CURETTAGE OF UTERUS     ENDOMETRIAL ABLATION     epidural injections     KNEE SURGERY     PELVIC LAPAROSCOPY     TUBAL LIGATION     Social History   Tobacco Use   Smoking status: Never   Smokeless tobacco: Never  Vaping Use   Vaping Use: Never used  Substance Use Topics   Alcohol use: Yes    Alcohol/week: 0.0 standard drinks    Comment: occ/rare   Drug use: No   Family History  Problem Relation Age of Onset   Heart disease Other        CAD   Heart disease Other        massive MI    COPD Father        Deceased, 19   Heart disease Father        CAD   Migraines Father    Heart attack Father    Migraines Brother    Thyroid disease Mother        Living, 39   Migraines Daughter    Breast cancer Maternal Grandmother    Heart attack Maternal Grandfather    Heart disease Paternal Grandmother    Allergies  Allergen Reactions   Latex Rash   Amlodipine     Pedal edema    Codeine     REACTION: vomiting   Codeine Phosphate     REACTION: vomiting   Crestor [Rosuvastatin]     Muscle pain    Lisinopril     Headache and severe nausea   Penicillins     REACTION: urticaria (hives) Did it involve swelling of the face/tongue/throat, SOB, or low BP? No Did it involve sudden or severe rash/hives, skin peeling, or any reaction on the inside of your mouth or nose? No Did you need to seek medical attention at a hospital or doctor's office? No When did it last happen?       If all above answers are "NO", may proceed with cephalosporin use.    Statins     Muscle pain    Current Outpatient Medications on File Prior to Visit  Medication Sig Dispense Refill   ondansetron (ZOFRAN) 4 MG tablet TAKE 1 TABLET BY MOUTH EVERY 8 HOURS AS NEEDED FOR NAUSEA OR VOMITING 90 tablet 0   oxyCODONE-acetaminophen (PERCOCET) 10-325 MG tablet Take 1 tablet by mouth every 6 (six) hours as needed for pain.      rizatriptan (MAXALT) 10 MG tablet TAKE ONE TABLET BY MOUTH AS NEEDED FOR MIGRAINE. MAY REPEAT IN 2 HOURS IF NEEDED. APPOINTMENT NEEDED. 10 tablet 11   tiZANidine (ZANAFLEX) 4 MG tablet Take 1 tablet (4 mg total) by mouth 3 (three) times daily as needed for muscle spasms. 30 tablet 0   zolpidem (AMBIEN) 10 MG tablet TAKE 1 TABLET(10 MG) BY MOUTH AT BEDTIME AS NEEDED 90 tablet 0   No current facility-administered medications on file prior to visit.   Review of Systems  Constitutional:  Positive for malaise/fatigue. Negative for chills and fever.  HENT:  Positive for congestion and sinus  pain. Negative for ear pain and sore throat.   Eyes:  Negative for blurred vision, discharge and redness.  Respiratory:  Positive for cough and sputum production. Negative for shortness of breath, wheezing and stridor.   Cardiovascular:  Negative for chest pain, palpitations  and leg swelling.  Gastrointestinal:  Negative for abdominal pain, diarrhea, nausea and vomiting.  Musculoskeletal:  Negative for myalgias.  Skin:  Negative for rash.  Neurological:  Positive for headaches. Negative for dizziness.  Psychiatric/Behavioral:  The patient has insomnia.      Observations/Objective: Pt sounds well, in no distress Frequent dry sounding cough Not hoarse  Nl cognition/good historian Nl mood, cheerful   Assessment and Plan: Problem List Items Addressed This Visit       Cardiovascular and Mediastinum   Chronic migraine w/o aura w/o status migrainosus, not intractable    Not currently seeing neurology  Fairly stable maxalt prn  Metoprolol for HTN also helps      Relevant Medications   metoprolol succinate (TOPROL-XL) 50 MG 24 hr tablet   Hypertension    bp in fair control at this time  BP Readings from Last 1 Encounters:  09/20/21 128/82  No changes needed Most recent labs reviewed  Disc lifstyle change with low sodium diet and exercise  Plan to continue metoprolol xl 100 mg daily  Plan for f/u and labs in the spring when she can afford her deductable      Relevant Medications   metoprolol succinate (TOPROL-XL) 50 MG 24 hr tablet     Respiratory   Acute sinusitis - Primary    S/p influenza a week ago  Persistent sinus pain with purulent mucous drainage and cough  Px azithromycin (she is pcn allergic) Fluids/rest Saline flonase-continue Update if not starting to improve in a week or if worsening  ER parameters discussed      Relevant Medications   azithromycin (ZITHROMAX Z-PAK) 250 MG tablet     Other   Insomnia    Continues ambien 10 mg  Insurance may only give  her 15 at a time  Per pt gabapentin (from her ortho) also helps  Disc imp of good sleep hygiene and self care      ADHD (attention deficit hyperactivity disorder), combined type    Improved with adderall 30 mg bid Cannot always afford to get it  Wants refill today  Will help starting new job         Follow Up Instructions: Take the zithromax as directed for sinus infection  Try delsym for cough (or mucinex DM if cough is productive) Drink fluids  Flonase and saline nasal spray may help also Update if not starting to improve in a week or if worsening    I refilled your metoprolol Glad the blood pressure is stable   I refilled the adderall as well-keep watching blood pressure on that  Please make an appt in office for the spring   Take care of yourself    I discussed the assessment and treatment plan with the patient. The patient was provided an opportunity to ask questions and all were answered. The patient agreed with the plan and demonstrated an understanding of the instructions.   The patient was advised to call back or seek an in-person evaluation if the symptoms worsen or if the condition fails to improve as anticipated.  I provided 20 minutes of non-face-to-face time during this encounter.   Loura Pardon, MD

## 2021-09-20 NOTE — Assessment & Plan Note (Signed)
Continues ambien 10 mg  Insurance may only give her 15 at a time  Per pt gabapentin (from her ortho) also helps  Disc imp of good sleep hygiene and self care

## 2021-09-20 NOTE — Assessment & Plan Note (Signed)
bp in fair control at this time  BP Readings from Last 1 Encounters:  09/20/21 128/82   No changes needed Most recent labs reviewed  Disc lifstyle change with low sodium diet and exercise  Plan to continue metoprolol xl 100 mg daily  Plan for f/u and labs in the spring when she can afford her deductable

## 2021-09-20 NOTE — Patient Instructions (Addendum)
Take the zithromax as directed for sinus infection  Try delsym for cough (or mucinex DM if cough is productive) Drink fluids  Flonase and saline nasal spray may help also Update if not starting to improve in a week or if worsening    I refilled your metoprolol Glad the blood pressure is stable   I refilled the adderall as well-keep watching blood pressure on that  Please make an appt in office for the spring   Take care of yourself

## 2021-10-25 ENCOUNTER — Emergency Department (HOSPITAL_BASED_OUTPATIENT_CLINIC_OR_DEPARTMENT_OTHER)
Admission: EM | Admit: 2021-10-25 | Discharge: 2021-10-26 | Disposition: A | Discharge status: Home / Self Care | Payer: 59 | Attending: Emergency Medicine | Admitting: Emergency Medicine

## 2021-10-25 ENCOUNTER — Other Ambulatory Visit: Payer: Self-pay

## 2021-10-25 ENCOUNTER — Encounter (HOSPITAL_BASED_OUTPATIENT_CLINIC_OR_DEPARTMENT_OTHER): Payer: Self-pay

## 2021-10-25 ENCOUNTER — Emergency Department (HOSPITAL_BASED_OUTPATIENT_CLINIC_OR_DEPARTMENT_OTHER): Payer: 59

## 2021-10-25 DIAGNOSIS — R200 Anesthesia of skin: Secondary | ICD-10-CM | POA: Diagnosis not present

## 2021-10-25 DIAGNOSIS — Z91040 Latex allergy status: Secondary | ICD-10-CM | POA: Diagnosis not present

## 2021-10-25 DIAGNOSIS — R29810 Facial weakness: Secondary | ICD-10-CM | POA: Diagnosis not present

## 2021-10-25 DIAGNOSIS — R791 Abnormal coagulation profile: Secondary | ICD-10-CM | POA: Insufficient documentation

## 2021-10-25 LAB — CBC
HCT: 41.9 % (ref 36.0–46.0)
Hemoglobin: 14.3 g/dL (ref 12.0–15.0)
MCH: 31 pg (ref 26.0–34.0)
MCHC: 34.1 g/dL (ref 30.0–36.0)
MCV: 90.9 fL (ref 80.0–100.0)
Platelets: 350 10*3/uL (ref 150–400)
RBC: 4.61 MIL/uL (ref 3.87–5.11)
RDW: 11.5 % (ref 11.5–15.5)
WBC: 8.6 10*3/uL (ref 4.0–10.5)
nRBC: 0 % (ref 0.0–0.2)

## 2021-10-25 LAB — COMPREHENSIVE METABOLIC PANEL
ALT: 19 U/L (ref 0–44)
AST: 20 U/L (ref 15–41)
Albumin: 5.3 g/dL — ABNORMAL HIGH (ref 3.5–5.0)
Alkaline Phosphatase: 49 U/L (ref 38–126)
Anion gap: 14 (ref 5–15)
BUN: 9 mg/dL (ref 6–20)
CO2: 27 mmol/L (ref 22–32)
Calcium: 9.7 mg/dL (ref 8.9–10.3)
Chloride: 100 mmol/L (ref 98–111)
Creatinine, Ser: 0.74 mg/dL (ref 0.44–1.00)
GFR, Estimated: >60 mL/min (ref >60)
Glucose, Bld: 110 mg/dL — ABNORMAL HIGH (ref 70–99)
Potassium: 3.2 mmol/L — ABNORMAL LOW (ref 3.5–5.1)
Sodium: 141 mmol/L (ref 135–145)
Total Bilirubin: 1.4 mg/dL — ABNORMAL HIGH (ref 0.3–1.2)
Total Protein: 8.1 g/dL (ref 6.5–8.1)

## 2021-10-25 LAB — DIFFERENTIAL
Abs Immature Granulocytes: 0.02 10*3/uL (ref 0.00–0.07)
Basophils Absolute: 0.1 10*3/uL (ref 0.0–0.1)
Basophils Relative: 1 %
Eosinophils Absolute: 0 10*3/uL (ref 0.0–0.5)
Eosinophils Relative: 0 %
Immature Granulocytes: 0 %
Lymphocytes Relative: 40 %
Lymphs Abs: 3.5 10*3/uL (ref 0.7–4.0)
Monocytes Absolute: 0.5 10*3/uL (ref 0.1–1.0)
Monocytes Relative: 6 %
Neutro Abs: 4.5 10*3/uL (ref 1.7–7.7)
Neutrophils Relative %: 53 %

## 2021-10-25 LAB — CBG MONITORING, ED: Glucose-Capillary: 111 mg/dL — ABNORMAL HIGH (ref 70–99)

## 2021-10-25 LAB — APTT: aPTT: 24 seconds (ref 24–36)

## 2021-10-25 LAB — PREGNANCY, URINE: Preg Test, Ur: NEGATIVE

## 2021-10-25 LAB — PROTIME-INR
INR: 1 (ref 0.8–1.2)
Prothrombin Time: 13 seconds (ref 11.4–15.2)

## 2021-10-25 MED ORDER — LORAZEPAM 2 MG/ML IJ SOLN
1.0000 mg | Freq: Once | INTRAMUSCULAR | Status: AC
  Administered 2021-10-26: 1 mg via INTRAVENOUS
  Filled 2021-10-25: qty 1

## 2021-10-25 NOTE — ED Provider Notes (Signed)
Patient transferred from [facility] ED , Dr. Almyra Free here for MRI r/o stroke.   Per previous provider  the patient has a "chief complaint of left-sided facial numbness and drooling sensation.  She states that she was doing fine without any symptoms until last night around 9 PM to 10 PM.  Sometime just prior to 10 PM she noticed a drooling sensation from the left side of face and numbness there.  Symptoms persisted throughout the night and persisted this morning.  When she woke up this morning and looked at herself nurse and felt her left side of the face appeared droopy.  Otherwise denies any weakness or numbness elsewhere.  Denies any difficulty walking denies headache or chest pain or abdominal pain.  She does have a history of prescription narcotic use and has recently been trying to detox off of them."  Patient found to have an equivocal exam. Awaiting MRI brain.   Margarita Mail, PA-C 10/26/21 2329    Lajean Saver, MD 10/28/21 [phone]

## 2021-10-25 NOTE — ED Triage Notes (Signed)
Patient here POV from Home with Stroke-Like Symptoms.   Patient states last PM she felt as if she was drooling and had numbness on her Left Side of Face. Patient states she was restless throughout the Night and then Patient noticed her Left Side of Face was Drooping.   Patient Anxious during Triage. A&Ox4. GCS 15.

## 2021-10-25 NOTE — ED Notes (Signed)
[facility] arrived to transport patient. Pt stable at time of departure.

## 2021-10-25 NOTE — ED Provider Notes (Signed)
[facility] EMERGENCY DEPT Provider Note   CSN: [id number] Arrival date & time: 10/25/21  1613     History  Chief Complaint  Patient presents with   Facial Droop    Laurie [name] is a 56 y.o. female.  In the midPatient presents to ER chief complaint of left-sided facial numbness and drooling sensation.  She states that she was doing fine without any symptoms until last night around 9 PM to 10 PM.  Sometime just prior to 10 PM she noticed a drooling sensation from the left side of face and numbness there.  Symptoms persisted throughout the night and persisted this morning.  When she woke up this morning and looked at herself nurse and felt her left side of the face appeared droopy.  Otherwise denies any weakness or numbness elsewhere.  Denies any difficulty walking denies headache or chest pain or abdominal pain.  She does have a history of prescription narcotic use and has recently been trying to detox off of them.      Home Medications Prior to Admission medications   Medication Sig Start Date End Date Taking? Authorizing Provider  amphetamine-dextroamphetamine (ADDERALL) 30 MG tablet Take 1 tablet by mouth 2 (two) times daily. 09/20/21   Tower, Wynelle Fanny, MD  azithromycin (ZITHROMAX Z-PAK) 250 MG tablet Take 2 pills by mouth today and then 1 pill daily for 4 days 09/20/21   Tower, Wynelle Fanny, MD  metoprolol succinate (TOPROL-XL) 50 MG 24 hr tablet Take 2 tablets (100 mg total) by mouth daily. Take with or immediately following a meal. 09/20/21   Tower, Roque Lias A, MD  ondansetron (ZOFRAN) 4 MG tablet TAKE 1 TABLET BY MOUTH EVERY 8 HOURS AS NEEDED FOR NAUSEA OR VOMITING 03/23/20   Tower, Wynelle Fanny, MD  oxyCODONE-acetaminophen (PERCOCET) 10-325 MG tablet Take 1 tablet by mouth every 6 (six) hours as needed for pain.     [provider]  rizatriptan (MAXALT) 10 MG tablet TAKE ONE TABLET BY MOUTH AS NEEDED FOR MIGRAINE. MAY REPEAT IN 2 HOURS IF NEEDED. APPOINTMENT NEEDED. 08/20/20    Narda Amber K, DO  tiZANidine (ZANAFLEX) 4 MG tablet Take 1 tablet (4 mg total) by mouth 3 (three) times daily as needed for muscle spasms. 11/29/19   Tower, Wynelle Fanny, MD  zolpidem (AMBIEN) 10 MG tablet TAKE 1 TABLET(10 MG) BY MOUTH AT BEDTIME AS NEEDED 09/14/21   Tower, Wynelle Fanny, MD      Allergies    Latex, Amlodipine, Codeine, Codeine phosphate, Crestor [rosuvastatin], Lisinopril, Penicillins, and Statins    Review of Systems   Review of Systems  Constitutional:  Negative for fever.  HENT:  Negative for ear pain.   Eyes:  Negative for pain.  Respiratory:  Negative for cough.   Cardiovascular:  Negative for chest pain.  Gastrointestinal:  Negative for abdominal pain.  Genitourinary:  Negative for flank pain.  Musculoskeletal:  Negative for back pain.  Skin:  Negative for rash.  Neurological:  Negative for headaches.   Physical Exam Updated Vital Signs BP (!) 171/110    Pulse 80    Temp 99.8 F (37.7 C)    Resp (!) 23    LMP  (LMP Unknown)    SpO2 99%  Physical Exam Constitutional:      General: She is not in acute distress.    Appearance: Normal appearance.  HENT:     Head: Normocephalic.     Nose: Nose normal.  Eyes:     Extraocular Movements: Extraocular  movements intact.  Cardiovascular:     Rate and Rhythm: Normal rate.  Pulmonary:     Effort: Pulmonary effort is normal.  Musculoskeletal:        General: Normal range of motion.     Cervical back: Normal range of motion.  Neurological:     General: No focal deficit present.     Mental Status: She is alert. Mental status is at baseline.     Comments: Equivocal left-sided facial droop appears mild.  No slurred speech noted.  Decree sensation to the left maxillary region.  No difference in forehead crease seen on the left or right side.  No weakness of eye closing noted left or right side.  Strength is otherwise 5/5 all extremities.  Finger-nose intact heel-to-shin is intact gait is normal.    ED Results / Procedures  / Treatments   Labs (all labs ordered are listed, but only abnormal results are displayed) Labs Reviewed  COMPREHENSIVE METABOLIC PANEL - Abnormal; Notable for the following components:      Result Value   Potassium 3.2 (*)    Glucose, Bld 110 (*)    Albumin 5.3 (*)    Total Bilirubin 1.4 (*)    All other components within normal limits  CBG MONITORING, ED - Abnormal; Notable for the following components:   Glucose-Capillary 111 (*)    All other components within normal limits  PROTIME-INR  APTT  CBC  DIFFERENTIAL  PREGNANCY, URINE    EKG EKG Interpretation  Date/Time:  Monday October 25 2021 16:22:15 EST Ventricular Rate:  92 PR Interval:  142 QRS Duration: 84 QT Interval:  420 QTC Calculation: 519 R Axis:   36 Text Interpretation: Normal sinus rhythm Nonspecific T wave abnormality Abnormal ECG When compared with ECG of 29-Oct-2019 10:42, Nonspecific T wave abnormality now evident in Inferior leads Nonspecific T wave abnormality, worse in Anterolateral leads QT has lengthened Confirmed by Thamas Jaegers ([id number]) on 10/25/2021 5:46:09 PM  Radiology CT Head Wo Contrast  Result Date: 10/25/2021 CLINICAL DATA:  Acute neuro deficit. Stroke suspected. Last night numbness on left side of face and drooling. EXAM: CT HEAD WITHOUT CONTRAST TECHNIQUE: Contiguous axial images were obtained from the base of the skull through the vertex without intravenous contrast. COMPARISON:  CT brain 10/29/2019 FINDINGS: Brain: The ventricles are normal in size and configuration. The basilar cisterns are patent. No mass, mass effect, or midline shift. No acute intracranial hemorrhage is seen. No abnormal extra-axial fluid collection. Preservation of the normal cortical gray-white interface without CT evidence of an acute major vascular territorial cortical based infarction. Vascular: No hyperdense vessel or unexpected calcification. Skull: Normal. Negative for fracture or focal lesion. Sinuses/Orbits: The  visualized orbits are unremarkable. The visualized paranasal sinuses and mastoid air cells are clear. Other: None. IMPRESSION: No acute intracranial process. Electronically Signed   By: Yvonne Kendall   On: 10/25/2021 17:34    Procedures Procedures    Medications Ordered in ED Medications - No data to display  ED Course/ Medical Decision Making/ A&P                           Medical Decision Making  No stroke alert activation given the onset of symptoms greater than 6 hours prior to arrival.  No evidence of large vessel occlusion noted.  Extraocular motion is intact.  Bell's palsy considered in differential, but no unilateral facial forehead crease difference noted.  Given sensation change of left-sided  face as well, imaging pursued.  No acute findings noted.  Patient to be transferred to [facility] for MRI of the brain.        Final Clinical Impression(s) / ED Diagnoses Final diagnoses:  Left facial numbness    Rx / DC Orders ED Discharge Orders     None         Luna Fuse, MD 10/25/21 Vernelle Emerald

## 2021-10-25 NOTE — ED Notes (Signed)
MD at Bedside.

## 2021-10-25 NOTE — ED Notes (Signed)
Pt requested a repeat swallow screen. Pt successfully passed repeat swallow screen.

## 2021-10-26 ENCOUNTER — Emergency Department (HOSPITAL_COMMUNITY): Payer: 59

## 2021-10-26 MED ORDER — POTASSIUM CHLORIDE CRYS ER 20 MEQ PO TBCR
40.0000 meq | EXTENDED_RELEASE_TABLET | Freq: Once | ORAL | Status: AC
  Administered 2021-10-26: 40 meq via ORAL
  Filled 2021-10-26: qty 2

## 2021-10-26 MED ORDER — LORAZEPAM 1 MG PO TABS: 1.0000 mg | ORAL_TABLET | Freq: Every day | ORAL | 0 refills | Status: DC | PRN

## 2021-10-26 NOTE — ED Notes (Signed)
Back from MRI.

## 2021-10-26 NOTE — ED Provider Notes (Signed)
Patient is a 56 year old female whose care was transferred to me by Francee Piccolo.  Patient initially a transfer from [facility] ED for an MRI to rule out stroke.  Per her initial provider, Dr. Almyra Free:  Per previous provider  the patient has a "chief complaint of left-sided facial numbness and drooling sensation.  She states that she was doing fine without any symptoms until last night around 9 PM to 10 PM.  Sometime just prior to 10 PM she noticed a drooling sensation from the left side of face and numbness there.  Symptoms persisted throughout the night and persisted this morning.  When she woke up this morning and looked at herself nurse and felt her left side of the face appeared droopy.  Otherwise denies any weakness or numbness elsewhere.  Denies any difficulty walking denies headache or chest pain or abdominal pain.  She does have a history of prescription narcotic use and has recently been trying to detox off of them." Physical Exam  BP 136/87    Pulse 80    Temp 98.8 F (37.1 C) (Oral)    Resp 16    Ht 5\' 3"  (1.6 m)    Wt 61 kg    LMP  (LMP Unknown)    SpO2 100%    BMI 23.82 kg/m   Physical Exam Vitals and nursing note reviewed.  Constitutional:      General: She is not in acute distress.    Appearance: Normal appearance. She is not ill-appearing, toxic-appearing or diaphoretic.  HENT:     Head: Normocephalic and atraumatic.     Right Ear: External ear normal.     Left Ear: External ear normal.     Nose: Nose normal.     Mouth/Throat:     Mouth: Mucous membranes are moist.     Pharynx: Oropharynx is clear. No oropharyngeal exudate or posterior oropharyngeal erythema.  Eyes:     General: No scleral icterus.       Right eye: No discharge.        Left eye: No discharge.     Extraocular Movements: Extraocular movements intact.     Conjunctiva/sclera: Conjunctivae normal.     Pupils: Pupils are equal, round, and reactive to light.     Comments: EOMI. PERRL.  Cardiovascular:     Rate and  Rhythm: Normal rate and regular rhythm.     Pulses: Normal pulses.     Heart sounds: Normal heart sounds. No murmur heard.   No friction rub. No gallop.  Pulmonary:     Effort: Pulmonary effort is normal. No respiratory distress.     Breath sounds: Normal breath sounds. No stridor. No wheezing, rhonchi or rales.  Abdominal:     General: Abdomen is flat.     Palpations: Abdomen is soft.     Tenderness: There is no abdominal tenderness.  Musculoskeletal:        General: Normal range of motion.     Cervical back: Normal range of motion and neck supple. No tenderness.  Skin:    General: Skin is warm and dry.  Neurological:     General: No focal deficit present.     Mental Status: She is alert and oriented to person, place, and time.     Comments: Patient is oriented to person, place, and time. Patient phonates in clear, complete, and coherent sentences.  No facial droop.  Symmetric sensation to light touch noted on both sides of the face.  Negative arm drift. Finger  to nose intact bilaterally with no visible signs of dysmetria. Strength is 5/5 in all four extremities. Distal sensation intact in all four extremities.  Psychiatric:        Mood and Affect: Mood normal.        Behavior: Behavior normal.   Procedures  Procedures  ED Course / MDM    Medical Decision Making Patient initially seen at the [facility] and evaluated by Dr. Almyra Free.  She notes to me that she has been increasingly stressed over the past 2 days.  She woke up yesterday morning with decrease in sensation to the left lower portion of her mouth.  She states that she then noticed the left side of her mouth was drooping downwards.  She called EMS due to this.  After arriving to the emergency department she received a CT scan as well as lab work.  Given that her symptoms had started greater than 6 hours prior to arrival there was no stroke alert activation.  Lab work is resulted and is generally reassuring.   Patient is hypokalemic at 3.2.  Will replete with Klor-Con.  Patient states that she was given Ativan prior to receiving her MRI.  She states after receiving this medication that this improved her symptoms significantly.  On my reevaluation she has no facial droop.  Extraocular movements are intact.  Symmetric sensation the face bilaterally.  Pupils are equal, round, and reactive to light.  Strength is 5/5 and symmetric in all 4 extremities.  Distal sensation intact.  No gross deficits.  Does not appear consistent with Bell's palsy at this time.  MRI has since resulted with findings as noted below:  IMPRESSION:  Normal brain MRI for age. No acute intracranial abnormality  identified.   Feel the patient is stable for discharge at this time and she is agreeable.  Will discharge on a short course of Ativan.  We discussed safety regarding this medication.  Recommended PCP follow-up.  We discussed return precautions.  Her questions were answered and she was amicable at the time of discharge.      Rayna Sexton, PA-C 10/26/21 0308    Tegeler, Gwenyth Allegra, MD 10/26/21 [phone]

## 2021-10-26 NOTE — ED Notes (Signed)
Pt verbalized understanding of d/c instructions, meds, and followup care. Denies questions. VSS, no distress noted. Steady gait to exit with all belongings.  ?

## 2021-10-26 NOTE — ED Notes (Signed)
Transport to MRI

## 2021-10-26 NOTE — ED Notes (Signed)
No deficits at this time. Patient expresses anxiety, but is comfortable at this time.

## 2021-10-26 NOTE — Discharge Instructions (Addendum)
Like we discussed, I am prescribing you a very short course of Ativan.  Please only take these as prescribed.  This medication can be sedating so do not operate a motor vehicle after taking it.  Do not operate heavy machinery after taking it.  Do not mix with alcohol.  Please follow-up with your regular doctor regarding your visit today.  If you develop any new or worsening symptoms please come back to the emergency department.

## 2021-11-14 ENCOUNTER — Encounter: Payer: Self-pay | Admitting: Family Medicine

## 2021-11-14 ENCOUNTER — Other Ambulatory Visit: Payer: Self-pay | Admitting: Family Medicine

## 2021-11-14 DIAGNOSIS — E7800 Pure hypercholesterolemia, unspecified: Secondary | ICD-10-CM

## 2021-11-15 MED ORDER — AMPHETAMINE-DEXTROAMPHETAMINE 30 MG PO TABS: 30.0000 mg | ORAL_TABLET | Freq: Two times a day (BID) | ORAL | 0 refills | Status: DC

## 2021-11-15 MED ORDER — ROSUVASTATIN CALCIUM 10 MG PO TABS: 10.0000 mg | ORAL_TABLET | Freq: Every day | ORAL | 1 refills | Status: DC

## 2021-11-15 NOTE — Telephone Encounter (Signed)
See mychart. That med was d/c on 10/22/19 saying she had side eff to med. It's not on med list anymore but on allergy list, please advise

## 2021-11-15 NOTE — Telephone Encounter (Signed)
Last filled on 09/20/21 VV for follow up and cough No future appointments. Last filled on 09/20/21 # 60 No UDS or Contract on file

## 2021-11-15 NOTE — Telephone Encounter (Signed)
Please call her to set up fasting labs for lipids in approx a month

## 2021-11-16 NOTE — Telephone Encounter (Signed)
Lvm for pt to schedule a lab appt.

## 2021-11-26 ENCOUNTER — Other Ambulatory Visit: Payer: Self-pay

## 2021-11-26 ENCOUNTER — Encounter: Payer: Self-pay | Admitting: Family Medicine

## 2021-11-26 ENCOUNTER — Ambulatory Visit (INDEPENDENT_AMBULATORY_CARE_PROVIDER_SITE_OTHER): Payer: 59 | Admitting: Family Medicine

## 2021-11-26 VITALS — BP 142/80 | HR 61 | Temp 97.8°F | Ht 63.0 in | Wt 132.4 lb

## 2021-11-26 DIAGNOSIS — E876 Hypokalemia: Secondary | ICD-10-CM | POA: Diagnosis not present

## 2021-11-26 DIAGNOSIS — E7800 Pure hypercholesterolemia, unspecified: Secondary | ICD-10-CM | POA: Diagnosis not present

## 2021-11-26 DIAGNOSIS — I10 Essential (primary) hypertension: Secondary | ICD-10-CM | POA: Diagnosis not present

## 2021-11-26 DIAGNOSIS — F4323 Adjustment disorder with mixed anxiety and depressed mood: Secondary | ICD-10-CM | POA: Diagnosis not present

## 2021-11-26 DIAGNOSIS — R7309 Other abnormal glucose: Secondary | ICD-10-CM

## 2021-11-26 DIAGNOSIS — G4700 Insomnia, unspecified: Secondary | ICD-10-CM

## 2021-11-26 LAB — COMPREHENSIVE METABOLIC PANEL
ALT: 16 U/L (ref 0–35)
AST: 21 U/L (ref 0–37)
Albumin: 4.2 g/dL (ref 3.5–5.2)
Alkaline Phosphatase: 50 U/L (ref 39–117)
BUN: 10 mg/dL (ref 6–23)
CO2: 34 mEq/L — ABNORMAL HIGH (ref 19–32)
Calcium: 9.5 mg/dL (ref 8.4–10.5)
Chloride: 104 mEq/L (ref 96–112)
Creatinine, Ser: 0.69 mg/dL (ref 0.40–1.20)
GFR: 97.28 mL/min (ref >60.00)
Glucose, Bld: 96 mg/dL (ref 70–99)
Potassium: 4.6 mEq/L (ref 3.5–5.1)
Sodium: 143 mEq/L (ref 135–145)
Total Bilirubin: 0.8 mg/dL (ref 0.2–1.2)
Total Protein: 6 g/dL (ref 6.0–8.3)

## 2021-11-26 LAB — CBC WITH DIFFERENTIAL/PLATELET
Basophils Absolute: 0 10*3/uL (ref 0.0–0.1)
Basophils Relative: 0.5 % (ref 0.0–3.0)
Eosinophils Absolute: 0 10*3/uL (ref 0.0–0.7)
Eosinophils Relative: 0.8 % (ref 0.0–5.0)
HCT: 38.5 % (ref 36.0–46.0)
Hemoglobin: 12.6 g/dL (ref 12.0–15.0)
Lymphocytes Relative: 40.2 % (ref 12.0–46.0)
Lymphs Abs: 1.9 10*3/uL (ref 0.7–4.0)
MCHC: 32.8 g/dL (ref 30.0–36.0)
MCV: 92.6 fl (ref 78.0–100.0)
Monocytes Absolute: 0.4 10*3/uL (ref 0.1–1.0)
Monocytes Relative: 8.7 % (ref 3.0–12.0)
Neutro Abs: 2.3 10*3/uL (ref 1.4–7.7)
Neutrophils Relative %: 49.8 % (ref 43.0–77.0)
Platelets: 260 10*3/uL (ref 150.0–400.0)
RBC: 4.16 Mil/uL (ref 3.87–5.11)
RDW: 12.3 % (ref 11.5–15.5)
WBC: 4.7 10*3/uL (ref 4.0–10.5)

## 2021-11-26 LAB — LIPID PANEL
Cholesterol: 168 mg/dL (ref 0–200)
HDL: 52 mg/dL (ref >39.00)
LDL Cholesterol: 100 mg/dL — ABNORMAL HIGH (ref 0–99)
NonHDL: 116.12
Total CHOL/HDL Ratio: 3
Triglycerides: 79 mg/dL (ref 0.0–149.0)
VLDL: 15.8 mg/dL (ref 0.0–40.0)

## 2021-11-26 LAB — TSH: TSH: 1.77 u[IU]/mL (ref 0.35–5.50)

## 2021-11-26 LAB — HEMOGLOBIN A1C: Hgb A1c MFr Bld: 5.5 % (ref 4.6–6.5)

## 2021-11-26 MED ORDER — BUSPIRONE HCL 15 MG PO TABS: 7.5000 mg | ORAL_TABLET | Freq: Two times a day (BID) | ORAL | 3 refills | Status: DC

## 2021-11-26 NOTE — Assessment & Plan Note (Signed)
Disc goals for lipids and reasons to control them Rev last labs with pt Rev low sat fat diet in detail Labs ordered Eating more carry out lately  Intol of statin in the past

## 2021-11-26 NOTE — Assessment & Plan Note (Signed)
Pt worried about this  She was told QT was longer than previous in her EKG Reviewed hospital records, lab results and studies in detail   bmet today  Pt c/o some jerkiness (arms/hands)

## 2021-11-26 NOTE — Assessment & Plan Note (Signed)
Worse lately with anxiety

## 2021-11-26 NOTE — Progress Notes (Signed)
Subjective:    Patient ID: Laurie [name], female    DOB: September 06, 1966, 56 y.o.   MRN: [id number]  This visit occurred during the SARS-CoV-2 public health emergency.  Safety protocols were in place, including screening questions prior to the visit, additional usage of staff PPE, and extensive cleaning of exam room while observing appropriate contact time as indicated for disinfecting solutions.   HPI Pt presents for f/u of ER visit a month ago for L sided facial numbness  Wt Readings from Last 3 Encounters:  11/26/21 132 lb 6 oz (60 kg)  10/25/21 134 lb 7.7 oz (61 kg)  09/20/21 136 lb (61.7 kg)   23.45 kg/m  On the day of ER visit she noticed a numb and drooling sensation on left side of face  Noted that she felt very stressed  Also trying to get off pain medication   She was given ativan which helped her symptoms  Nl neuro exam  Noted K of 3.2   BP Readings from Last 3 Encounters:  11/26/21 (!) 142/80  10/26/21 (!) 142/99  09/20/21 128/82   Pulse Readings from Last 3 Encounters:  11/26/21 61  10/26/21 80  01/06/20 60     CT and MRI reports normal  MRI:  Study Result  Narrative & Impression  CLINICAL DATA:  Initial evaluation for acute neuro deficit, stroke suspected.   EXAM: MRI HEAD WITHOUT CONTRAST   TECHNIQUE: Multiplanar, multiecho pulse sequences of the brain and surrounding structures were obtained without intravenous contrast.   COMPARISON:  Prior CT from 10/25/2021.   FINDINGS: Brain: Cerebral volume within normal limits for age. Few punctate foci of T2/FLAIR hyperintensity noted involving the supratentorial cerebral white matter, nonspecific, but overall minimal in nature, and felt to be within normal limits for age.   No abnormal foci of restricted diffusion to suggest acute or subacute ischemia. Gray-white matter differentiation well maintained. No encephalomalacia to suggest chronic cortical infarction. No acute intracranial hemorrhage.  Single subcentimeter focus of susceptibility artifact noted at the posterior right temporal occipital region, likely a small chronic microhemorrhage, of doubtful significance in isolation (series 14, image 31).   No mass lesion, midline shift or mass effect. Ventricles normal size without hydrocephalus. No extra-axial fluid collection. Pituitary gland suprasellar region within normal limits. Midline structures intact and normal.   Vascular: Major intracranial vascular flow voids are maintained.   Skull and upper cervical spine: Craniocervical junction within normal limits. Trace degenerative anterolisthesis of C2 on C3 noted. Susceptibility artifact related to prior fusion partially visualize within the upper cervical spine. Bone marrow signal intensity within normal limits. No scalp soft tissue abnormality.   Sinuses/Orbits: Globes and orbital soft tissues within normal limits. Paranasal sinuses are largely clear. No mastoid effusion.   Other: None.   IMPRESSION: Normal brain MRI for age. No acute intracranial abnormality identified.   Lab Results  Component Value Date   CREATININE 0.74 10/25/2021   BUN 9 10/25/2021   NA 141 10/25/2021   K 3.2 (L) 10/25/2021   CL 100 10/25/2021   CO2 27 10/25/2021   Lab Results  Component Value Date   ALT 19 10/25/2021   AST 20 10/25/2021   ALKPHOS 49 10/25/2021   BILITOT 1.4 (H) 10/25/2021   Lab Results  Component Value Date   WBC 8.6 10/25/2021   HGB 14.3 10/25/2021   HCT 41.9 10/25/2021   MCV 90.9 10/25/2021   PLT 350 10/25/2021   Now :   Bp has been labile  185/107 last night  She took another dose of bp med and a bath and it went back down   Off balance at times   Not as much stress right now  Not working Some financial worries  Is anxious - at times worse ? If panic attacks    EKG: ? Long QT Per pt it was noted that the QT had lengthened  Non specific T wave abn   She has felt jerky the past few days  Arms  and shoulders  Wonders if this is related to her K problems   Had a little diarrhea/vomiting when she was in w/d from narcotic  She was coming off hydrocodone and she had suboxone  Now done with it   Chronic pain- suboxone helps now  Tylenol does not help  Nsaids hurt her stomach  Still takes gabapentin   Lunesta for sleep   Hesitant to see psychiatrist due to $ problems   Is out of work for another 2 months  Would like to go back to work when she can   Palpitations  Occ migraine  Not as severe but still happening   At times her pulse gets low/down to 48   Doxepin Lexapro Prozac Cymbalta (worked the best because too sedating)    Lab Results  Component Value Date   TSH 1.05 06/07/2018   Wants to check cholesterol  Diet is fair  Too much carry out   Patient Active Problem List   Diagnosis Date Noted   Hypertension 10/22/2019   Hypokalemia 02/26/2019   Chronic migraine w/o aura w/o status migrainosus, not intractable 02/26/2019   Elevated glucose 06/10/2018   Hyperlipidemia 06/07/2018   ADHD (attention deficit hyperactivity disorder), combined type 04/17/2018   Lateral epicondylitis 04/17/2018   Menopause syndrome 04/17/2018   Poor concentration 03/06/2018   IBS (irritable bowel syndrome) 09/04/2017   Chest pain in adult 01/02/2017   Adjustment disorder with mixed anxiety and depressed mood 05/25/2016   Low back pain 03/04/2015   Vitamin D deficiency 03/04/2015   Chronic daily headache 02/19/2014   Fatigue 04/27/2012   Ovarian cyst 04/27/2012   Insomnia 06/06/2007   DEGENERATIVE DISC DISEASE 06/05/2007   Stress reaction 04/04/2007   Cervical stenosis of spine 04/04/2007   Past Medical History:  Diagnosis Date   Abnormal uterine bleeding    Anemia    Anxiety    situational   Chronic neck pain    from disc dz   Dysmenorrhea    Endometriosis    Fibroid    IBS (irritable bowel syndrome)    diarrhea predominant   Insomnia    organic sleep disorder,  failed multiple meds   Migraine    Urinary incontinence    Past Surgical History:  Procedure Laterality Date   ABDOMINAL HYSTERECTOMY     ABDOMINAL HYSTERECTOMY     BREAST BIOPSY Right    BREAST CYST ASPIRATION Right    Deg C-S disk/spinal stenosis  06/2007   DILATION AND CURETTAGE OF UTERUS     ENDOMETRIAL ABLATION     epidural injections     KNEE SURGERY     PELVIC LAPAROSCOPY     TUBAL LIGATION     Social History   Tobacco Use   Smoking status: Never   Smokeless tobacco: Never  Vaping Use   Vaping Use: Never used  Substance Use Topics   Alcohol use: Yes    Alcohol/week: 0.0 standard drinks    Comment: occ/rare   Drug use:  No   Family History  Problem Relation Age of Onset   Heart disease Other        CAD   Heart disease Other        massive MI   COPD Father        Deceased, 50   Heart disease Father        CAD   Migraines Father    Heart attack Father    Migraines Brother    Thyroid disease Mother        Living, 50   Migraines Daughter    Breast cancer Maternal Grandmother    Heart attack Maternal Grandfather    Heart disease Paternal Grandmother    Allergies  Allergen Reactions   Latex Rash   Amlodipine     Pedal edema    Codeine     REACTION: vomiting   Codeine Phosphate     REACTION: vomiting   Crestor [Rosuvastatin]     Muscle pain    Lisinopril     Headache and severe nausea   Penicillins     REACTION: urticaria (hives) Did it involve swelling of the face/tongue/throat, SOB, or low BP? No Did it involve sudden or severe rash/hives, skin peeling, or any reaction on the inside of your mouth or nose? No Did you need to seek medical attention at a hospital or doctor's office? No When did it last happen?       If all above answers are NO, may proceed with cephalosporin use.    Current Outpatient Medications on File Prior to Visit  Medication Sig Dispense Refill   Buprenorphine HCl-Naloxone HCl 4-1 MG FILM Take 1 application by mouth in  the morning, at noon, and at bedtime.     Eszopiclone 3 MG TABS Take 3 mg by mouth at bedtime.     gabapentin (NEURONTIN) 300 MG capsule Take 300 mg by mouth 2 (two) times daily.     metoprolol succinate (TOPROL-XL) 50 MG 24 hr tablet Take 2 tablets (100 mg total) by mouth daily. Take with or immediately following a meal. 60 tablet 5   ondansetron (ZOFRAN) 4 MG tablet TAKE 1 TABLET BY MOUTH EVERY 8 HOURS AS NEEDED FOR NAUSEA OR VOMITING 90 tablet 0   rizatriptan (MAXALT) 10 MG tablet TAKE ONE TABLET BY MOUTH AS NEEDED FOR MIGRAINE. MAY REPEAT IN 2 HOURS IF NEEDED. APPOINTMENT NEEDED. (Patient taking differently: Take 10 mg by mouth daily as needed for migraine.) 10 tablet 11   rosuvastatin (CRESTOR) 10 MG tablet Take 1 tablet (10 mg total) by mouth daily. 90 tablet 1   tiZANidine (ZANAFLEX) 4 MG tablet Take 1 tablet (4 mg total) by mouth 3 (three) times daily as needed for muscle spasms. 30 tablet 0   amphetamine-dextroamphetamine (ADDERALL) 30 MG tablet Take 1 tablet by mouth 2 (two) times daily. (Patient not taking: Reported on 11/26/2021) 60 tablet 0   No current facility-administered medications on file prior to visit.    Review of Systems  Constitutional:  Positive for fatigue. Negative for activity change, appetite change, fever and unexpected weight change.  HENT:  Negative for congestion, ear pain, rhinorrhea, sinus pressure and sore throat.   Eyes:  Negative for pain, redness and visual disturbance.  Respiratory:  Negative for cough, shortness of breath and wheezing.   Cardiovascular:  Positive for palpitations. Negative for chest pain and leg swelling.  Gastrointestinal:  Negative for abdominal pain, blood in stool, constipation and diarrhea.  Endocrine: Negative for polydipsia  and polyuria.  Genitourinary:  Negative for dysuria, frequency and urgency.  Musculoskeletal:  Negative for arthralgias, back pain and myalgias.  Skin:  Negative for pallor and rash.  Allergic/Immunologic:  Negative for environmental allergies.  Neurological:  Positive for tremors. Negative for dizziness, syncope, weakness and headaches.       Jerkiness   Hematological:  Negative for adenopathy. Does not bruise/bleed easily.  Psychiatric/Behavioral:  Negative for decreased concentration and dysphoric mood. The patient is nervous/anxious.        ?panic   At times feels difficulty breathing       Objective:   Physical Exam Constitutional:      General: She is not in acute distress.    Appearance: Normal appearance. She is well-developed and normal weight.  HENT:     Head: Normocephalic and atraumatic.  Eyes:     Conjunctiva/sclera: Conjunctivae normal.     Pupils: Pupils are equal, round, and reactive to light.  Neck:     Thyroid: No thyromegaly.     Vascular: No carotid bruit or JVD.  Cardiovascular:     Rate and Rhythm: Normal rate and regular rhythm.     Heart sounds: Normal heart sounds.    No gallop.  Pulmonary:     Effort: Pulmonary effort is normal. No respiratory distress.     Breath sounds: Normal breath sounds. No wheezing or rales.  Abdominal:     General: Bowel sounds are normal. There is no distension or abdominal bruit.     Palpations: Abdomen is soft. There is no mass.     Tenderness: There is no abdominal tenderness.  Musculoskeletal:     Cervical back: Normal range of motion and neck supple.     Right lower leg: No edema.     Left lower leg: No edema.  Lymphadenopathy:     Cervical: No cervical adenopathy.  Skin:    General: Skin is warm and dry.     Coloration: Skin is not pale.     Findings: No rash.  Neurological:     Mental Status: She is alert.     Cranial Nerves: Cranial nerves 2-12 are intact.     Sensory: Sensation is intact.     Motor: Tremor present. No weakness, atrophy, abnormal muscle tone or pronator drift.     Coordination: Coordination is intact. Romberg sign negative. Coordination normal.     Deep Tendon Reflexes: Reflexes are normal and  symmetric. Reflexes normal.  Psychiatric:        Attention and Perception: Attention normal.        Mood and Affect: Mood is anxious.        Speech: Speech normal.        Behavior: Behavior normal.        Cognition and Memory: Cognition and memory normal.          Assessment & Plan:   Problem List Items Addressed This Visit       Cardiovascular and Mediastinum   Hypertension - Primary    BP has been labile at home but also quite anxious re: some physical symptoms since coming off narcotic pain medicine  BP: (!) 142/80    Plan to continue metoprolol xl 100 mg daily  F/u 1 mo or earlier if needed  May improve more with anxiety control      Relevant Orders   CBC with Differential/Platelet   Comprehensive metabolic panel   TSH   Lipid panel     Other  Adjustment disorder with mixed anxiety and depressed mood    More anxious lately   Reviewed stressors/ coping techniques/symptoms/ support sources/ tx options and side effects in detail today  No doubt anxiety is playing some role in her palpitations and facial symptoms and labile bp  In the past has failed/intol to Doxepin lexapro prozac cymbalta  Willing to try buspar 7.5 mg bid  Discussed expectations of this medication including time to effectiveness and mechanism of action, also poss of side effects (early and late)- including mental fuzziness, weight or appetite change, nausea and poss of worse dep or anxiety (even suicidal thoughts)  Pt voiced understanding and will stop med and update if this occurs   Declines counseling Discussed use of a journal  Will f/u 1 mo  Hope that anxiety control may also help some of her physical symptoms       Elevated glucose    A1c ordered  disc imp of low glycemic diet and wt loss to prevent DM2       Relevant Orders   Hemoglobin A1c   Hyperlipidemia    Disc goals for lipids and reasons to control them Rev last labs with pt Rev low sat fat diet in detail Labs  ordered Eating more carry out lately  Intol of statin in the past       Hypokalemia    Pt worried about this  She was told QT was longer than previous in her EKG Reviewed hospital records, lab results and studies in detail   bmet today  Pt c/o some jerkiness (arms/hands)       Relevant Orders   Comprehensive metabolic panel   Insomnia    Worse lately with anxiety

## 2021-11-26 NOTE — Assessment & Plan Note (Signed)
More anxious lately   Reviewed stressors/ coping techniques/symptoms/ support sources/ tx options and side effects in detail today  No doubt anxiety is playing some role in her palpitations and facial symptoms and labile bp  In the past has failed/intol to Doxepin lexapro prozac cymbalta  Willing to try buspar 7.5 mg bid  Discussed expectations of this medication including time to effectiveness and mechanism of action, also poss of side effects (early and late)- including mental fuzziness, weight or appetite change, nausea and poss of worse dep or anxiety (even suicidal thoughts)  Pt voiced understanding and will stop med and update if this occurs   Declines counseling Discussed use of a journal  Will f/u 1 mo  Hope that anxiety control may also help some of her physical symptoms

## 2021-11-26 NOTE — Assessment & Plan Note (Signed)
A1c ordered disc imp of low glycemic diet and wt loss to prevent DM2  

## 2021-11-26 NOTE — Assessment & Plan Note (Signed)
BP has been labile at home but also quite anxious re: some physical symptoms since coming off narcotic pain medicine  BP: (!) 142/80    Plan to continue metoprolol xl 100 mg daily  F/u 1 mo or earlier if needed  May improve more with anxiety control

## 2021-11-26 NOTE — Patient Instructions (Signed)
Start writing in a journal   Take care of yourself   Lab today   Start buspar 7.5 mg twice daily  If any side effects or if you feel worse hold it and let me know   If you ever feel very depressed or think you could hurt yourself please call us and get to the ER   Follow up in 1 months

## 2021-12-03 ENCOUNTER — Other Ambulatory Visit: Payer: Self-pay | Admitting: Family Medicine

## 2021-12-03 DIAGNOSIS — Z1231 Encounter for screening mammogram for malignant neoplasm of breast: Secondary | ICD-10-CM

## 2021-12-06 ENCOUNTER — Other Ambulatory Visit: Payer: Self-pay

## 2021-12-06 ENCOUNTER — Encounter: Payer: Self-pay | Admitting: Internal Medicine

## 2021-12-06 ENCOUNTER — Ambulatory Visit (INDEPENDENT_AMBULATORY_CARE_PROVIDER_SITE_OTHER): Payer: 59 | Admitting: Internal Medicine

## 2021-12-06 VITALS — BP 136/85 | HR 50 | Ht 63.0 in | Wt 134.2 lb

## 2021-12-06 DIAGNOSIS — R9431 Abnormal electrocardiogram [ECG] [EKG]: Secondary | ICD-10-CM

## 2021-12-06 DIAGNOSIS — R002 Palpitations: Secondary | ICD-10-CM

## 2021-12-06 DIAGNOSIS — R079 Chest pain, unspecified: Secondary | ICD-10-CM | POA: Diagnosis not present

## 2021-12-06 DIAGNOSIS — I10 Essential (primary) hypertension: Secondary | ICD-10-CM

## 2021-12-06 DIAGNOSIS — R0609 Other forms of dyspnea: Secondary | ICD-10-CM

## 2021-12-06 DIAGNOSIS — Z79899 Other long term (current) drug therapy: Secondary | ICD-10-CM

## 2021-12-06 DIAGNOSIS — G4730 Sleep apnea, unspecified: Secondary | ICD-10-CM

## 2021-12-06 MED ORDER — SPIRONOLACTONE 25 MG PO TABS: 25.0000 mg | ORAL_TABLET | Freq: Every day | ORAL | 3 refills | Status: DC

## 2021-12-06 MED ORDER — METOPROLOL SUCCINATE ER 50 MG PO TB24: 25.0000 mg | ORAL_TABLET | Freq: Every day | ORAL | 5 refills | Status: DC

## 2021-12-06 NOTE — Progress Notes (Signed)
ELECTROPHYSIOLOGY CONSULT NOTE  Patient ID: Laurie [name], MRN: [id number], DOB/AGE: [birth year] 56 y.o. Admit date: (Not on file) Date of Consult: 12/06/2021  Primary Physician: Tower, Wynelle Fanny, MD Primary Cardiologist:       Laurie [name] is a 56 y.o. female who is being seen today for the evaluation of chest pain at the request of [place].    HPI Laurie [name] is a 56 y.o. female Seen for palpitations and chest pain   Seen in ER 1/23 for BP 190/120, assoc with face pulling. CT/MRI imaging neg.    She has atypical chest pains with sensations of heaviness that often last minutes.  Worse with rest and recumbency.  No shortness of breath.  But does have intermittent peripheral edema.  Has a history of hypertension and has been recurrently hypokalemic.  Does not recall an evaluation for hyperaldosteronism  She complains of dizziness which she is describing walking into doorways that she goes down the hall.  She has complaints of muscle jerking's and more recently hallucinations.  She had a C-spine injury some years ago and last month she stopped her 4-year use of opiates.  This occurred somewhat after the time of her hypertensive episode.Hypertension treated with metoprolol.   In her mind the metoprolol has aggravated her fatigue.  She has on her own decrease metoprolol from 100--50 because of bradycardia which was symptomatic.  She feels better on the 50 mg daily.  Also palpitations where she describes her heart slowing down" pausing.  Sleep disordered breathing with arousal while sleeping and daytime somnolence.  She lives alone.   DATE TEST EF   4/18 Echo stress    % Normal stress  4/18 Echo   60-65 %         Date Cr K Hgb   1/23  3.2    2/23 0.69 4.6 12.6             She has almost disabling fatigue.  She says she has a 47-year-old car that has 4000 miles on her because she never goes anywhere.  She works at home and goes from her desk to her bed.  Acknowledges depression  denies suicidality.   Past Medical History:  Diagnosis Date   Abnormal uterine bleeding    Anemia    Anxiety    situational   Chronic neck pain    from disc dz   Dysmenorrhea    Endometriosis    Fibroid    IBS (irritable bowel syndrome)    diarrhea predominant   Insomnia    organic sleep disorder, failed multiple meds   Migraine    Urinary incontinence       Surgical History:  Past Surgical History:  Procedure Laterality Date   ABDOMINAL HYSTERECTOMY     ABDOMINAL HYSTERECTOMY     BREAST BIOPSY Right    BREAST CYST ASPIRATION Right    Deg C-S disk/spinal stenosis  06/2007   DILATION AND CURETTAGE OF UTERUS     ENDOMETRIAL ABLATION     epidural injections     KNEE SURGERY     PELVIC LAPAROSCOPY     TUBAL LIGATION       Home Meds: Current Meds  Medication Sig   baclofen (LIORESAL) 10 MG tablet baclofen 10 mg tablet  TAKE 1 TABLET BY MOUTH THREE TIMES A DAY AS NEEDED   Buprenorphine HCl-Naloxone HCl 4-1 MG FILM Take 1 application by mouth in the morning, at noon, and at bedtime.  Eszopiclone 3 MG TABS Take 3 mg by mouth at bedtime.   gabapentin (NEURONTIN) 300 MG capsule Take 300 mg by mouth 2 (two) times daily.   metoprolol succinate (TOPROL-XL) 50 MG 24 hr tablet Take 2 tablets (100 mg total) by mouth daily. Take with or immediately following a meal.   ondansetron (ZOFRAN) 4 MG tablet TAKE 1 TABLET BY MOUTH EVERY 8 HOURS AS NEEDED FOR NAUSEA OR VOMITING (Patient taking differently: TAKE 1 TABLET BY MOUTH EVERY 8 HOURS AS NEEDED FOR NAUSEA OR VOMITING. Migraines)   rizatriptan (MAXALT) 10 MG tablet TAKE ONE TABLET BY MOUTH AS NEEDED FOR MIGRAINE. MAY REPEAT IN 2 HOURS IF NEEDED. APPOINTMENT NEEDED. (Patient taking differently: Take 10 mg by mouth daily as needed for migraine.)   rosuvastatin (CRESTOR) 10 MG tablet Take 1 tablet (10 mg total) by mouth daily.   tiZANidine (ZANAFLEX) 4 MG tablet Take 1 tablet (4 mg total) by mouth 3 (three) times daily as needed for  muscle spasms. (Patient taking differently: Take 4 mg by mouth 3 (three) times daily as needed for muscle spasms. Per patient taking for migraines)    Allergies:  Allergies  Allergen Reactions   Latex Rash   Amlodipine     Pedal edema    Codeine     REACTION: vomiting   Codeine Phosphate     REACTION: vomiting   Crestor [Rosuvastatin]     Muscle pain    Lisinopril     Headache and severe nausea   Penicillins     REACTION: urticaria (hives) Did it involve swelling of the face/tongue/throat, SOB, or low BP? No Did it involve sudden or severe rash/hives, skin peeling, or any reaction on the inside of your mouth or nose? No Did you need to seek medical attention at a hospital or doctor's office? No When did it last happen?       If all above answers are NO, may proceed with cephalosporin use.     Social History   Socioeconomic History   Marital status: Significant Other    Spouse name: Not on file   Number of children: 3   Years of education: 14   Highest education level: Some college, no degree  Occupational History   Occupation: Investment banker, corporate: [employer]  Tobacco Use   Smoking status: Never   Smokeless tobacco: Never  Vaping Use   Vaping Use: Never used  Substance and Sexual Activity   Alcohol use: Yes    Alcohol/week: 0.0 standard drinks    Comment: occ/rare   Drug use: No   Sexual activity: Yes    Birth control/protection: Surgical  Other Topics Concern   Not on file  Social History Narrative   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   She lives alone on the 3rd floor.     Highest level of education:  Some college.  Has 3 daughters.   Right handed   Social Determinants of Health   Financial Resource Strain: Not on file  Food Insecurity: Not on file  Transportation Needs: Not on file  Physical Activity: Not on file  Stress: Not on file  Social Connections: Not on file  Intimate Partner Violence: Not on file      Family History  Problem Relation Age of Onset   Heart disease Other        CAD   Heart disease Other        massive MI   COPD  Father        Deceased, 62   Heart disease Father        CAD   Migraines Father    Heart attack Father    Migraines Brother    Thyroid disease Mother        Living, 81   Migraines Daughter    Breast cancer Maternal Grandmother    Heart attack Maternal Grandfather    Heart disease Paternal Grandmother      ROS:  Please see the history of present illness.     All other systems reviewed and negative.    Physical Exam: Blood pressure 136/85, pulse (!) 50, height 5\' 3"  (1.6 m), weight 134 lb 3.2 oz (60.9 kg), SpO2 99 %. General: Well developed, well nourished female in no acute distress. Head: Normocephalic, atraumatic, sclera non-icteric, no xanthomas, nares are without discharge. EENT: normal  Lymph Nodes:  none Neck: Negative for carotid bruits. JVD not elevated. Back:without scoliosis kyphosis Lungs: Clear bilaterally to auscultation without wheezes, rales, or rhonchi. Breathing is unlabored. Heart: RRR with S1 S2. No murmur . No rubs, or gallops appreciated. Abdomen: Soft, non-tender, non-distended with normoactive bowel sounds. No hepatomegaly. No rebound/guarding. No obvious abdominal masses. Msk:  Strength and tone appear normal for age. Extremities: No clubbing or cyanosis. No  edema.  Distal pedal pulses are 2+ and equal bilaterally. Skin: Warm and Dry Neuro: Alert and oriented X 3. CN III-XII intact Grossly normal sensory and motor function . Psych:  Responds to questions appropriately with a normal affect.        EKG: Sinus at 50 Intervals 15/09/44   Assessment and Plan:  Chest pain-atypical  Dizziness-really balance  Fatigue  Depression  Sleep disordered breathing  Sinus bradycardia  Hypertension  Hypokalemia   The patient has a multitude of complaints including balance issues and hallucinations, profound disabling  fatigue and depression.  On top of that she has atypical nonexertional, recumbent chest pains.  Some peripheral edema and a history of hypertension associated with hypokalemia and treated with metoprolol.  A combination of the hypertension and the hypokalemia raises the issue of hyper renin hypoaldosteronism.  We will check the ratio.  Her beta-blockers are likely contributing to both her fatigue directly as well as the her bradycardia.  We will further down titrate from 50--25.  We will begin her on spironolactone at 25 mg a day for blood pressure.  She will need a metabolic profile in about 3 weeks.  With sleep disordered breathing and fatigue we will undertake a sleep study.  Given the anxieties about her heart, we will undertake a calcium score.  With dyspnea on exertion and her hypertension we will undertake 2D echocardiogram  Strongly encouraged her to continue to work on her mental health     Virl Axe

## 2021-12-06 NOTE — Patient Instructions (Addendum)
Medication Instructions:  Your physician has recommended you make the following change in your medication:   Decrease Metoprolol 50mg  - 1/2 tablet by mouth daily  Start Spironolactone 25mg  - 1 tablet by mouth daily.  *If you need a refill on your cardiac medications before your next appointment, please call your pharmacy*   Lab Work: Renin/Aldosterone level today  BMET in 3 weeks  If you have labs (blood work) drawn today and your tests are completely normal, you will receive your results only by: [place] (if you have MyChart) OR A paper copy in the mail If you have any lab test that is abnormal or we need to change your treatment, we will call you to review the results.   Testing/Procedures:  **Calcium Score CT  Your physician has recommended that you have a sleep study. This test records several body functions during sleep, including: brain activity, eye movement, oxygen and carbon dioxide blood levels, heart rate and rhythm, breathing rate and rhythm, the flow of air through your mouth and nose, snoring, body muscle movements, and chest and belly movement.   Your physician has requested that you have an echocardiogram. Echocardiography is a painless test that uses sound waves to create images of your heart. It provides your doctor with information about the size and shape of your heart and how well your hearts chambers and valves are working. This procedure takes approximately one hour. There are no restrictions for this procedure.    Follow-Up: At [facility], you and your health needs are our priority.  As part of our continuing mission to provide you with exceptional heart care, we have created designated Provider Care Teams.  These Care Teams include your primary Cardiologist (physician) and Advanced Practice Providers (APPs -  Physician Assistants and Nurse Practitioners) who all work together to provide you with the care you need, when you need it.  We recommend  signing up for the patient portal called "MyChart".  Sign up information is provided on this After Visit Summary.  MyChart is used to connect with patients for Virtual Visits (Telemedicine).  Patients are able to view lab/test results, encounter notes, upcoming appointments, etc.  Non-urgent messages can be sent to your provider as well.   To learn more about what you can do with MyChart, go to [email or web address].    Your next appointment:   To be determined

## 2021-12-07 ENCOUNTER — Ambulatory Visit: Payer: 59

## 2021-12-14 LAB — ALDOSTERONE + RENIN ACTIVITY W/ RATIO
ALDOS/RENIN RATIO: >24 (ref 0.0–30.0)
ALDOSTERONE: 4 ng/dL (ref 0.0–30.0)
Renin: <0.167 ng/mL/hr — ABNORMAL LOW (ref 0.167–5.380)

## 2021-12-18 ENCOUNTER — Other Ambulatory Visit: Payer: Self-pay | Admitting: Family Medicine

## 2021-12-20 NOTE — Telephone Encounter (Signed)
Pharmacy sending request to change to 90 day supply. Is this ok? ?

## 2021-12-24 ENCOUNTER — Other Ambulatory Visit: Payer: Self-pay | Admitting: Internal Medicine

## 2021-12-24 DIAGNOSIS — R0609 Other forms of dyspnea: Secondary | ICD-10-CM

## 2021-12-27 ENCOUNTER — Other Ambulatory Visit: Payer: 59

## 2021-12-29 ENCOUNTER — Other Ambulatory Visit: Payer: Self-pay

## 2021-12-29 ENCOUNTER — Encounter: Payer: Self-pay | Admitting: Family Medicine

## 2021-12-29 ENCOUNTER — Ambulatory Visit (INDEPENDENT_AMBULATORY_CARE_PROVIDER_SITE_OTHER): Payer: 59

## 2021-12-29 DIAGNOSIS — R0609 Other forms of dyspnea: Secondary | ICD-10-CM

## 2021-12-29 LAB — ECHOCARDIOGRAM COMPLETE
AR max vel: 2.65 cm2
AV Area VTI: 2.36 cm2
AV Area mean vel: 2.36 cm2
AV Mean grad: 3 mmHg
AV Peak grad: 5.9 mmHg
Ao pk vel: 1.21 m/s
Area-P 1/2: 2.31 cm2
Calc EF: 54.8 %
S' Lateral: 2.7 cm
Single Plane A2C EF: 53.9 %
Single Plane A4C EF: 56.9 %

## 2021-12-30 MED ORDER — RIZATRIPTAN BENZOATE 10 MG PO TABS: ORAL_TABLET | ORAL | 0 refills | Status: DC

## 2021-12-30 NOTE — Addendum Note (Signed)
Addended by: Loura Pardon A on: 12/30/2021 09:44 PM ? ? Modules accepted: Orders ? ?

## 2022-01-17 ENCOUNTER — Ambulatory Visit (HOSPITAL_BASED_OUTPATIENT_CLINIC_OR_DEPARTMENT_OTHER): Payer: 59 | Admitting: Cardiology

## 2022-01-17 DIAGNOSIS — G4730 Sleep apnea, unspecified: Secondary | ICD-10-CM

## 2022-01-18 ENCOUNTER — Institutional Professional Consult (permissible substitution): Payer: 59 | Admitting: Internal Medicine

## 2022-01-18 ENCOUNTER — Ambulatory Visit (HOSPITAL_BASED_OUTPATIENT_CLINIC_OR_DEPARTMENT_OTHER): Payer: 59 | Attending: Internal Medicine | Admitting: Cardiology

## 2022-01-18 DIAGNOSIS — G4733 Obstructive sleep apnea (adult) (pediatric): Secondary | ICD-10-CM

## 2022-01-18 NOTE — Procedures (Signed)
Erroneous encounter

## 2022-01-18 NOTE — Progress Notes (Signed)
This encounter was created in error - please disregard.

## 2022-01-19 NOTE — Procedures (Signed)
?  ? ?  Patient Name: Laurie [name], Laurie [name] ?Study Date: 01/18/2022 ?Gender: Female ?D.O.B: 1966/03/04 ?Age (years): 32 ?Referring Provider: Virl Axe, MD ?Height (inches): 63 ?Interpreting Physician: Fransico Him MD, ABSM ?Weight (lbs): 135 ?RPSGT: Jacolyn Reedy ?BMI: 24 ?MRN: [id number] ?Neck Size: 13.75 ? ?CLINICAL INFORMATION ?Sleep Study Type: HST ? ?Indication for sleep study: N/A ? ?Epworth Sleepiness Score: 8 ? ?SLEEP STUDY TECHNIQUE ?A multi-channel overnight portable sleep study was performed. The channels recorded were: nasal airflow, thoracic respiratory movement, and oxygen saturation with a pulse oximetry. Snoring was also monitored. ? ?MEDICATIONS ?Patient self administered medications include: N/A. ? ?SLEEP ARCHITECTURE ?Patient was studied for 373.5 minutes. The sleep efficiency was 100.0 % and the patient was supine for 89%. The arousal index was 0.0 per hour. ? ?RESPIRATORY PARAMETERS ?The overall AHI was 19.4 per hour, with a central apnea index of 0 per hour. ? ?The oxygen nadir was 89% during sleep. ? ?CARDIAC DATA ?Mean heart rate during sleep was 64.2 bpm. ? ?IMPRESSIONS ?- Moderate obstructive sleep apnea occurred during this study (AHI = 19.4/h). ?- Mild oxygen desaturation was noted during this study (Min O2 = 89%). ?- No snoring was audible during this study. ? ?DIAGNOSIS ?- Obstructive Sleep Apnea (G47.33) ? ?RECOMMENDATIONS ?Recommend in lab CPAP titration.  ?Avoid alcohol, sedatives and other CNS depressants that may worsen sleep apnea and disrupt normal sleep architecture. ?Sleep hygiene should be reviewed to assess factors that may improve sleep quality. ?Weight management and regular exercise should be initiated or continued. ? ? ?[Electronically signed] 01/19/2022 01:45 PM ? ?Fransico Him MD, ABSM ?Diplomate, Tax adviser of Sleep Medicine ?

## 2022-01-27 ENCOUNTER — Telehealth: Payer: Self-pay

## 2022-01-27 DIAGNOSIS — E876 Hypokalemia: Secondary | ICD-10-CM

## 2022-01-27 MED ORDER — MAGNESIUM OXIDE -MG SUPPLEMENT 400 (240 MG) MG PO TABS: 400.0000 mg | ORAL_TABLET | Freq: Every day | ORAL | 3 refills | Status: DC

## 2022-01-27 NOTE — Telephone Encounter (Signed)
-----   Message from Emily Filbert, RN sent at 01/25/2022 10:57 AM EDT ----- ?[facility] patient ?----- Message ----- ?From: Deboraha Sprang, MD ?Sent: 01/19/2022  11:03 AM EDT ?To: Emily Filbert, RN ? ?Please Inform Patient that -the Renin is low and this   is abnormal -- it has taken a lot of conversation to come to some understanding-- and with the low K recommendation is to add Mag Oxide 400 andthen we can repeat the tests in about 3 months ?Thanks ? ? ?

## 2022-01-27 NOTE — Telephone Encounter (Signed)
Spoke with pt and advised of lab results and recommendation as below per Dr Caryl Comes.  Pt verbalizes understanding and agrees with current plan.  Appointment scheduled for labs on 04/28/2022.   ?

## 2022-01-29 ENCOUNTER — Other Ambulatory Visit: Payer: Self-pay | Admitting: Family Medicine

## 2022-01-31 NOTE — Telephone Encounter (Signed)
Last OV was a ER f/u on 11/26/21, last filled on 12/30/21 #10 tabs 0 refills ?

## 2022-02-01 ENCOUNTER — Telehealth: Payer: Self-pay | Admitting: *Deleted

## 2022-02-01 DIAGNOSIS — G4730 Sleep apnea, unspecified: Secondary | ICD-10-CM

## 2022-02-01 DIAGNOSIS — G4700 Insomnia, unspecified: Secondary | ICD-10-CM

## 2022-02-01 DIAGNOSIS — R5383 Other fatigue: Secondary | ICD-10-CM

## 2022-02-01 NOTE — Telephone Encounter (Signed)
-----   Message from Lauralee Evener, [place] sent at 01/20/2022  3:31 PM EDT ----- ? ?----- Message ----- ?From: Sueanne Margarita, MD ?Sent: 01/19/2022   1:46 PM EDT ?To: Cv Div Sleep Studies ? ?Please let patient know that they have sleep apnea.  Recommend therapeutic CPAP titration for treatment of patient's sleep disordered breathing.  If unable to perform an in lab titration then initiate ResMed auto CPAP from 4 to 15cm H2O with heated humidity and mask of choice and overnight pulse ox on CPAP.    ? ?

## 2022-02-01 NOTE — Telephone Encounter (Signed)
The patient has been notified of the result and verbalized understanding.  All questions (if any) were answered. ?Laurie [name], [name] 02/01/2022 4:37 PM   ?Titration to precert ? ?

## 2022-02-02 ENCOUNTER — Other Ambulatory Visit: Payer: 59

## 2022-02-14 NOTE — Addendum Note (Signed)
Addended by: Freada Bergeron on: 02/14/2022 02:35 PM ? ? Modules accepted: Orders ? ?

## 2022-02-14 NOTE — Telephone Encounter (Addendum)
CPAP titration PA submitted to [facility] via web portal. Titration was NOT APPROVED ? ?PER dr Radford Pax, ?If unable to perform an in lab titration then initiate ResMed auto CPAP from 4 to 15cm H2O with heated humidity and mask of choice and overnight pulse ox on CPAP.    ?                             ?DME selection is [facility]. ?Patient understands he will be contacted by [facility] to set up his cpap. ?Patient understands to call if [facility] does not contact him with new setup in a timely manner. ?Patient understands they will be called once confirmation has been received from [facility] that they have received their new machine to schedule 10 week follow up appointment. ?  ?[facility] notified of new cpap order  ?Please add to airview ?Patient was grateful for the call and thanked me   ?

## 2022-02-21 ENCOUNTER — Other Ambulatory Visit: Payer: 59

## 2022-02-21 ENCOUNTER — Ambulatory Visit (HOSPITAL_BASED_OUTPATIENT_CLINIC_OR_DEPARTMENT_OTHER)
Admission: RE | Admit: 2022-02-21 | Discharge: 2022-02-21 | Disposition: A | Discharge status: Home / Self Care | Payer: 59 | Source: Ambulatory Visit | Attending: Internal Medicine | Admitting: Internal Medicine

## 2022-02-21 DIAGNOSIS — R079 Chest pain, unspecified: Secondary | ICD-10-CM | POA: Insufficient documentation

## 2022-02-21 DIAGNOSIS — R0609 Other forms of dyspnea: Secondary | ICD-10-CM | POA: Insufficient documentation

## 2022-02-22 ENCOUNTER — Ambulatory Visit
Admission: RE | Admit: 2022-02-22 | Discharge: 2022-02-22 | Disposition: A | Discharge status: Home / Self Care | Payer: 59 | Source: Ambulatory Visit | Attending: Family Medicine | Admitting: Family Medicine

## 2022-02-22 DIAGNOSIS — Z1231 Encounter for screening mammogram for malignant neoplasm of breast: Secondary | ICD-10-CM

## 2022-03-04 ENCOUNTER — Other Ambulatory Visit: Payer: Self-pay | Admitting: Family Medicine

## 2022-03-08 ENCOUNTER — Encounter: Payer: Self-pay | Admitting: Family Medicine

## 2022-03-08 ENCOUNTER — Telehealth: Payer: Self-pay

## 2022-03-08 NOTE — Telephone Encounter (Signed)
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 You should avoid use of Cipro and other associated antibiotics (flouroquinolone antibiotics ) Pt verbalizes understanding and thanked Therapist, sports for the phone call.

## 2022-03-08 NOTE — Telephone Encounter (Signed)
-----   Message from Deboraha Sprang, MD sent at 03/05/2022  6:12 AM EDT ----- Please Inform Patient that Ca score study was normal but there is some dilitation of her aorta which will require annual followup        Recent studies have raised concern that fluoroquinolone antibiotics could be associated with an increased risk of aortic aneurysm or aortic dissection. You should avoid use of Cipro and other associated antibiotics (flouroquinolone antibiotics )    Thanks

## 2022-03-24 ENCOUNTER — Telehealth: Payer: Self-pay | Admitting: Neurology

## 2022-03-24 NOTE — Telephone Encounter (Signed)
Pt called in stating she thinks she has been having seizures. She says they happen sporadic. She woke up feeling like someone was attacking her, but it turns out she was digging her own fingernails into her arm. She didn't want to wait until December to be seen.

## 2022-03-25 NOTE — Telephone Encounter (Signed)
Called pateint and left voicemail message  

## 2022-03-29 NOTE — Telephone Encounter (Signed)
Called patient and left voicemail sending a my chart message as well

## 2022-04-28 ENCOUNTER — Other Ambulatory Visit: Payer: 59

## 2022-11-03 DIAGNOSIS — G4733 Obstructive sleep apnea (adult) (pediatric): Secondary | ICD-10-CM | POA: Diagnosis not present

## 2022-12-04 DIAGNOSIS — G4733 Obstructive sleep apnea (adult) (pediatric): Secondary | ICD-10-CM | POA: Diagnosis not present

## 2023-01-02 DIAGNOSIS — G4733 Obstructive sleep apnea (adult) (pediatric): Secondary | ICD-10-CM | POA: Diagnosis not present

## 2023-01-30 ENCOUNTER — Other Ambulatory Visit: Payer: Self-pay | Admitting: Family Medicine

## 2023-01-31 NOTE — Telephone Encounter (Signed)
Last filled on 01/31/22 #10 tabs with 11 refills, last OV was 11/26/21 (over a year), and no future appts.

## 2023-01-31 NOTE — Telephone Encounter (Signed)
Please schedule a follow up appt.

## 2023-02-01 NOTE — Telephone Encounter (Signed)
Called # on file, spoke to someone, they stated the pt no longer has the # anymore. Sent mychart message to pt.

## 2023-02-02 DIAGNOSIS — G4733 Obstructive sleep apnea (adult) (pediatric): Secondary | ICD-10-CM | POA: Diagnosis not present

## 2023-02-03 ENCOUNTER — Emergency Department (HOSPITAL_BASED_OUTPATIENT_CLINIC_OR_DEPARTMENT_OTHER)
Admission: EM | Admit: 2023-02-03 | Discharge: 2023-02-03 | Disposition: A | Discharge status: Home / Self Care | Payer: Medicaid Other | Attending: Emergency Medicine | Admitting: Emergency Medicine

## 2023-02-03 ENCOUNTER — Encounter (HOSPITAL_BASED_OUTPATIENT_CLINIC_OR_DEPARTMENT_OTHER): Payer: Self-pay | Admitting: Emergency Medicine

## 2023-02-03 DIAGNOSIS — G43809 Other migraine, not intractable, without status migrainosus: Secondary | ICD-10-CM | POA: Insufficient documentation

## 2023-02-03 DIAGNOSIS — Z91040 Latex allergy status: Secondary | ICD-10-CM | POA: Insufficient documentation

## 2023-02-03 MED ORDER — PROCHLORPERAZINE EDISYLATE 10 MG/2ML IJ SOLN
10.0000 mg | Freq: Once | INTRAMUSCULAR | Status: AC
Start: 2023-02-03 — End: 2023-02-03
  Administered 2023-02-03: 10 mg via INTRAVENOUS
  Filled 2023-02-03: qty 2

## 2023-02-03 MED ORDER — KETOROLAC TROMETHAMINE 30 MG/ML IJ SOLN
15.0000 mg | Freq: Once | INTRAMUSCULAR | Status: AC
  Administered 2023-02-03: 15 mg via INTRAVENOUS

## 2023-02-03 MED ORDER — SODIUM CHLORIDE 0.9 % IV BOLUS
1000.0000 mL | Freq: Once | INTRAVENOUS | Status: AC
  Administered 2023-02-03: 1000 mL via INTRAVENOUS

## 2023-02-03 MED ORDER — KETOROLAC TROMETHAMINE 30 MG/ML IJ SOLN
30.0000 mg | Freq: Once | INTRAMUSCULAR | Status: DC
  Filled 2023-02-03: qty 1

## 2023-02-03 NOTE — ED Triage Notes (Signed)
Migraine x 3 days. Not relieved with home meds

## 2023-02-03 NOTE — ED Notes (Signed)
Reviewed AVS with patient, patient expressed understanding of directions, denies further questions at this time. 

## 2023-02-03 NOTE — ED Provider Notes (Signed)
[facility] AT [facility]  Provider Note  CSN: [id number] Arrival date & time: 02/03/23 0135  History Chief Complaint  Patient presents with   Migraine    Laurie [name] is a 57 y.o. female with history of migraines reports she was doing well in the last few years after having a cervical fusion. She has now had three days of persistent diffuse headache, some sinus pressure, photophobia, nausea and poor PO intake. Similar to previous. She reports she is out of her Maxalt and her daily Norco (awaiting insurance PA for refills).    Home Medications Prior to Admission medications   Medication Sig Start Date End Date Taking? Authorizing Provider  amphetamine-dextroamphetamine (ADDERALL) 30 MG tablet Take 1 tablet by mouth 2 (two) times daily. Patient not taking: Reported on 12/06/2021 11/15/21   Tower, Audrie Gallus, MD  baclofen (LIORESAL) 10 MG tablet baclofen 10 mg tablet  TAKE 1 TABLET BY MOUTH THREE TIMES A DAY AS NEEDED    [provider]  Buprenorphine HCl-Naloxone HCl 4-1 MG FILM Take 1 application by mouth in the morning, at noon, and at bedtime.    [provider]  busPIRone (BUSPAR) 15 MG tablet TAKE 1/2 TABLET (7.5 MG TOTAL) BY MOUTH TWICE A DAY 12/20/21   Tower, Marne A, MD  Eszopiclone 3 MG TABS Take 3 mg by mouth at bedtime. 11/02/21   [provider]  gabapentin (NEURONTIN) 300 MG capsule Take 300 mg by mouth 2 (two) times daily.    [provider]  magnesium oxide (MAG-OX) 400 (240 Mg) MG tablet Take 1 tablet (400 mg total) by mouth daily. 01/27/22   Duke Salvia, MD  metoprolol succinate (TOPROL-XL) 50 MG 24 hr tablet Take 0.5 tablets (25 mg total) by mouth daily. Take with or immediately following a meal. 12/06/21   Duke Salvia, MD  ondansetron (ZOFRAN) 4 MG tablet TAKE 1 TABLET BY MOUTH EVERY 8 HOURS AS NEEDED FOR NAUSEA OR VOMITING Patient taking differently: TAKE 1 TABLET BY MOUTH EVERY 8 HOURS AS NEEDED FOR  NAUSEA OR VOMITING. Migraines 03/23/20   Tower, Audrie Gallus, MD  rizatriptan (MAXALT) 10 MG tablet TAKE ONE TABLET BY MOUTH AS NEEDED FOR MIGRAINE. MAY REPEAT IN 2 HOURS IF NEEDED. 01/31/23   Tower, Audrie Gallus, MD  rosuvastatin (CRESTOR) 10 MG tablet Take 1 tablet (10 mg total) by mouth daily. 11/15/21   Tower, Audrie Gallus, MD  spironolactone (ALDACTONE) 25 MG tablet Take 1 tablet (25 mg total) by mouth daily. 12/06/21   Duke Salvia, MD  tiZANidine (ZANAFLEX) 4 MG tablet Take 1 tablet (4 mg total) by mouth 3 (three) times daily as needed for muscle spasms. Patient taking differently: Take 4 mg by mouth 3 (three) times daily as needed for muscle spasms. Per patient taking for migraines 11/29/19   Tower, Audrie Gallus, MD     Allergies    Latex, Amlodipine, Codeine, Codeine phosphate, Crestor [rosuvastatin], Lisinopril, Penicillins, and Quinolones   Review of Systems   Review of Systems Please see HPI for pertinent positives and negatives  Physical Exam BP (!) 130/94   Pulse 82   Temp 98.2 F (36.8 C) (Oral)   Resp 18   LMP  (LMP Unknown)   SpO2 97%   Physical Exam Vitals and nursing note reviewed.  Constitutional:      Appearance: Normal appearance.  HENT:     Head: Normocephalic and atraumatic.     Nose: Nose normal.  Mouth/Throat:     Mouth: Mucous membranes are moist.  Eyes:     Extraocular Movements: Extraocular movements intact.     Conjunctiva/sclera: Conjunctivae normal.  Cardiovascular:     Rate and Rhythm: Normal rate.  Pulmonary:     Effort: Pulmonary effort is normal.  Musculoskeletal:        General: No swelling. Normal range of motion.     Cervical back: Neck supple.  Skin:    General: Skin is warm and dry.  Neurological:     General: No focal deficit present.     Mental Status: She is alert and oriented to person, place, and time.  Psychiatric:        Mood and Affect: Mood normal.     ED Results / Procedures / Treatments    EKG None  Procedures Procedures  Medications Ordered in the ED Medications  prochlorperazine (COMPAZINE) injection 10 mg (10 mg Intravenous Given 02/03/23 0328)  sodium chloride 0.9 % bolus 1,000 mL (1,000 mLs Intravenous New Bag/Given 02/03/23 0330)  ketorolac (TORADOL) 30 MG/ML injection 15 mg (15 mg Intravenous Given 02/03/23 0328)    Initial Impression and Plan  Patient here with migraine, now ongoing for several days. Exam nd vitals are reassuring. Will give a Migraine cocktail and reassess for improvement.   ED Course   Clinical Course as of 02/03/23 0409  Fri Feb 03, 2023  0407 Patient feeling better and would like to go home. Recommend PCP follow up, RTED for any other concerns.   [CS]    Clinical Course User Index [CS] Pollyann Savoy, MD     MDM Rules/Calculators/A&P Medical Decision Making Problems Addressed: Other migraine without status migrainosus, not intractable: chronic illness or injury with exacerbation, progression, or side effects of treatment  Risk Prescription drug management.     Final Clinical Impression(s) / ED Diagnoses Final diagnoses:  Other migraine without status migrainosus, not intractable    Rx / DC Orders ED Discharge Orders     None        Pollyann Savoy, MD 02/03/23 [phone]

## 2023-03-04 DIAGNOSIS — G4733 Obstructive sleep apnea (adult) (pediatric): Secondary | ICD-10-CM | POA: Diagnosis not present

## 2023-03-20 DIAGNOSIS — Z79899 Other long term (current) drug therapy: Secondary | ICD-10-CM | POA: Diagnosis not present

## 2023-03-20 DIAGNOSIS — M5030 Other cervical disc degeneration, unspecified cervical region: Secondary | ICD-10-CM | POA: Diagnosis not present

## 2023-03-20 DIAGNOSIS — G894 Chronic pain syndrome: Secondary | ICD-10-CM | POA: Diagnosis not present

## 2023-03-20 DIAGNOSIS — Z5181 Encounter for therapeutic drug level monitoring: Secondary | ICD-10-CM | POA: Diagnosis not present

## 2023-03-20 DIAGNOSIS — G4700 Insomnia, unspecified: Secondary | ICD-10-CM | POA: Diagnosis not present

## 2023-04-04 DIAGNOSIS — G4733 Obstructive sleep apnea (adult) (pediatric): Secondary | ICD-10-CM | POA: Diagnosis not present

## 2023-04-12 ENCOUNTER — Encounter: Payer: Self-pay | Admitting: Family

## 2023-04-12 ENCOUNTER — Ambulatory Visit (INDEPENDENT_AMBULATORY_CARE_PROVIDER_SITE_OTHER): Payer: Medicaid Other | Admitting: Family

## 2023-04-12 VITALS — BP 106/74 | HR 83 | Temp 97.7°F | Ht 63.0 in | Wt 123.0 lb

## 2023-04-12 DIAGNOSIS — G43709 Chronic migraine without aura, not intractable, without status migrainosus: Secondary | ICD-10-CM

## 2023-04-12 DIAGNOSIS — G4700 Insomnia, unspecified: Secondary | ICD-10-CM

## 2023-04-12 DIAGNOSIS — F4323 Adjustment disorder with mixed anxiety and depressed mood: Secondary | ICD-10-CM

## 2023-04-12 MED ORDER — ONDANSETRON 8 MG PO TBDP
8.0000 mg | ORAL_TABLET | Freq: Three times a day (TID) | ORAL | 1 refills | Status: DC | PRN
Start: 2023-04-12 — End: 2023-08-09

## 2023-04-12 MED ORDER — ESCITALOPRAM OXALATE 5 MG PO TABS
5.0000 mg | ORAL_TABLET | Freq: Every day | ORAL | 1 refills | Status: DC
Start: 2023-04-12 — End: 2023-05-08

## 2023-04-12 MED ORDER — ZOLPIDEM TARTRATE 3.5 MG SL SUBL
3.5000 mg | SUBLINGUAL_TABLET | Freq: Once | SUBLINGUAL | 2 refills | Status: DC
Start: 2023-04-12 — End: 2023-04-13

## 2023-04-12 MED ORDER — EMGALITY 120 MG/ML ~~LOC~~ SOSY
240.0000 mg | PREFILLED_SYRINGE | Freq: Once | SUBCUTANEOUS | 0 refills | Status: AC
Start: 2023-04-12 — End: 2023-04-12

## 2023-04-12 NOTE — Patient Instructions (Addendum)
Welcome to [facility], It was a pleasure meeting you today!    As discussed, I have sent the higher dose of Zofran (oral disintegrating) and generic Lexapro to your pharmacy. OK to increase your Lexapro to 2 pills after 2 weeks if your symptoms are not improved.  Try the Emgality injectables and let me know how you are doing in 2-3 weeks and I can send the RX in with a prior authorization.  Please schedule a 1 month follow up visit today.    PLEASE NOTE: If you had any LAB tests please let us know if you have not heard back within a few days. You may see your results on MyChart before we have a chance to review them but we will give you a call once they are reviewed by [place]. If we ordered any REFERRALS today, please let us know if you have not heard from their office within the next week.  Let us know through MyChart if you are needing REFILLS, or have your pharmacy send [place] the request. You can also use MyChart to communicate with me or any office staff.  Please try these tips to maintain a healthy lifestyle: It is important that you exercise regularly at least 30 minutes 5 times a week. Think about what you will eat, plan ahead. Choose whole foods, & think  "clean, green, fresh or frozen" over canned, processed or packaged foods which are more sugary, salty, and fatty. 70 to 75% of food eaten should be fresh vegetables and protein. 2-3  meals daily with healthy snacks between meals, but must be whole fruit, protein or vegetables. Aim to eat over a 10 hour period when you are active, for example, 7am to 5pm, and then STOP after your last meal of the day, drinking only water.  Shorter eating windows, 6-8 hours, are showing benefits in heart disease and blood sugar regulation. Drink water every day! Shoot for 64 ounces daily = 8 cups, no other drink is as healthy! Fruit juice is best enjoyed in a healthy way, by EATING the fruit.

## 2023-04-12 NOTE — Assessment & Plan Note (Addendum)
chronic hx of 10mg  Ambien, helps her fall asleep but wakes 4h later, alternates with Lunesta which doesn't help her fall asleep but she maintains her sleep better sending 3.5mg   SL Ambien for breakthru insomnia, advised on use & SE f/u 3 mos

## 2023-04-12 NOTE — Assessment & Plan Note (Signed)
chronic failed or intolerance to Doxepin, lexapro, prozac, cymbalta, but pt reports these were many years ago, most recently Buspar, but caused stomach pain sending Lexapro 5mg  every day, advised can increase to 2 tabs after 2 weeks if no improvement, advised on use & SE f/u 1 month

## 2023-04-12 NOTE — Progress Notes (Signed)
New Patient Office Visit  Subjective:  Patient ID: Laurie [name], female    DOB: 09-Aug-1966  Age: 57 y.o. MRN: [id number]  CC:  Chief Complaint  Patient presents with  . New Patient (Initial Visit)  . Anxiety and Depression    Pt c/o Anxiety and depression for years.   . Migraine    Pt c/o migraines for a couple of years. Has tried Maxalt as needed and Amovig shots in the past.     HPI Laurie [name] presents for establishing care today.  Anxiety and depression:  reports hx of using Buspar but feels like it caused muscle & stomach pain. Takes Zolpidem for years and takes for a few months then has to switch to [place] as it stops working. Reports having a lot of stress r/t to not working, getting evicted from home. Had been on disability but getting ready to work out.   Migraines:  reports hx of using Maxalt & Imitrex and Aimovig in past. Reports taking Topamax in past, caused stomach problems, also Metoprolol that did not work. Reports having bad headaches as well as the migraines, usually lasts a few days. States she has about 12/ month. Also reports nausea and light sensitivity. Reports strobe lights, stress, dehydration, denies food triggers, sometimes certain odors are triggers.    Assessment & Plan:  Anxiety and depression -     Escitalopram Oxalate; Take 1 tablet (5 mg total) by mouth daily.  Dispense: 30 tablet; Refill: 1  Chronic migraine w/o aura w/o status migrainosus, not intractable -     Ondansetron; Take 1 tablet (8 mg total) by mouth every 8 (eight) hours as needed for nausea or vomiting.  Dispense: 20 tablet; Refill: 1    Subjective:    Outpatient Medications Prior to Visit  Medication Sig Dispense Refill  . baclofen (LIORESAL) 10 MG tablet baclofen 10 mg tablet  TAKE 1 TABLET BY MOUTH THREE TIMES A DAY AS NEEDED    . Buprenorphine HCl-Naloxone HCl 4-1 MG FILM Take 1 application by mouth in the morning, at noon, and at bedtime.    . metoprolol succinate  (TOPROL-XL) 50 MG 24 hr tablet Take 0.5 tablets (25 mg total) by mouth daily. Take with or immediately following a meal. 15 tablet 5  . rizatriptan (MAXALT) 10 MG tablet TAKE ONE TABLET BY MOUTH AS NEEDED FOR MIGRAINE. MAY REPEAT IN 2 HOURS IF NEEDED. 10 tablet 1  . ondansetron (ZOFRAN) 4 MG tablet TAKE 1 TABLET BY MOUTH EVERY 8 HOURS AS NEEDED FOR NAUSEA OR VOMITING (Patient taking differently: TAKE 1 TABLET BY MOUTH EVERY 8 HOURS AS NEEDED FOR NAUSEA OR VOMITING. Migraines) 90 tablet 0  . busPIRone (BUSPAR) 15 MG tablet TAKE 1/2 TABLET (7.5 MG TOTAL) BY MOUTH TWICE A DAY (Patient not taking: Reported on 04/12/2023) 90 tablet 2  . amphetamine-dextroamphetamine (ADDERALL) 30 MG tablet Take 1 tablet by mouth 2 (two) times daily. (Patient not taking: Reported on 12/06/2021) 60 tablet 0  . Eszopiclone 3 MG TABS Take 3 mg by mouth at bedtime. (Patient not taking: Reported on 04/12/2023)    . gabapentin (NEURONTIN) 300 MG capsule Take 300 mg by mouth 2 (two) times daily. (Patient not taking: Reported on 04/12/2023)    . magnesium oxide (MAG-OX) 400 (240 Mg) MG tablet Take 1 tablet (400 mg total) by mouth daily. (Patient not taking: Reported on 04/12/2023) 90 tablet 3  . rosuvastatin (CRESTOR) 10 MG tablet Take 1 tablet (10 mg total) by mouth daily. (Patient  not taking: Reported on 04/12/2023) 90 tablet 1  . spironolactone (ALDACTONE) 25 MG tablet Take 1 tablet (25 mg total) by mouth daily. (Patient not taking: Reported on 04/12/2023) 90 tablet 3  . tiZANidine (ZANAFLEX) 4 MG tablet Take 1 tablet (4 mg total) by mouth 3 (three) times daily as needed for muscle spasms. (Patient taking differently: Take 4 mg by mouth 3 (three) times daily as needed for muscle spasms. Per patient taking for migraines) 30 tablet 0   No facility-administered medications prior to visit.   Past Medical History:  Diagnosis Date  . Abnormal uterine bleeding   . Anemia   . Anxiety    situational  . Chronic neck pain    from disc dz   . Dysmenorrhea   . Endometriosis   . Fibroid   . IBS (irritable bowel syndrome)    diarrhea predominant  . Insomnia    organic sleep disorder, failed multiple meds  . Migraine   . Urinary incontinence    Past Surgical History:  Procedure Laterality Date  . ABDOMINAL HYSTERECTOMY    . ABDOMINAL HYSTERECTOMY    . BREAST BIOPSY Right 03/31/2015  . BREAST CYST ASPIRATION Right 03/31/2015  . CERVICAL FUSION     2022  . Deg C-S disk/spinal stenosis  06/2007  . DILATION AND CURETTAGE OF UTERUS    . ENDOMETRIAL ABLATION    . epidural injections    . KNEE SURGERY    . PELVIC LAPAROSCOPY    . TUBAL LIGATION      Objective:   Today's Vitals: BP 106/74   Pulse 83   Temp 97.7 F (36.5 C) (Temporal)   Ht 5\' 3"  (1.6 m)   Wt 123 lb (55.8 kg)   LMP  (LMP Unknown)   SpO2 99%   BMI 21.79 kg/m   Physical Exam Vitals and nursing note reviewed.  Constitutional:      Appearance: Normal appearance.  Cardiovascular:     Rate and Rhythm: Normal rate and regular rhythm.  Pulmonary:     Effort: Pulmonary effort is normal.     Breath sounds: Normal breath sounds.  Musculoskeletal:        General: Normal range of motion.  Skin:    General: Skin is warm and dry.  Neurological:     Mental Status: She is alert.  Psychiatric:        Mood and Affect: Mood normal.        Behavior: Behavior normal.   Meds ordered this encounter  Medications  . ondansetron (ZOFRAN-ODT) 8 MG disintegrating tablet    Sig: Take 1 tablet (8 mg total) by mouth every 8 (eight) hours as needed for nausea or vomiting.    Dispense:  20 tablet    Refill:  1    Order Specific Question:   Supervising Provider    Answer:   ANDY, CAMILLE L [[id number]]  . escitalopram (LEXAPRO) 5 MG tablet    Sig: Take 1 tablet (5 mg total) by mouth daily.    Dispense:  30 tablet    Refill:  1    Order Specific Question:   Supervising Provider    Answer:   ANDY, CAMILLE L [[id number]]    Dulce Sellar, NP

## 2023-04-13 ENCOUNTER — Encounter: Payer: Self-pay | Admitting: Family

## 2023-04-13 ENCOUNTER — Other Ambulatory Visit: Payer: Self-pay | Admitting: Family

## 2023-04-13 ENCOUNTER — Telehealth: Payer: Self-pay | Admitting: Family

## 2023-04-13 DIAGNOSIS — G4700 Insomnia, unspecified: Secondary | ICD-10-CM

## 2023-04-13 MED ORDER — ZOLPIDEM TARTRATE 3.5 MG SL SUBL
3.5000 mg | SUBLINGUAL_TABLET | Freq: Every evening | SUBLINGUAL | 2 refills | Status: DC | PRN
Start: 2023-04-13 — End: 2023-08-10

## 2023-04-13 NOTE — Telephone Encounter (Signed)
Pt states pharmcy did not get the RX  Zolpidem Tartrate 3.5 MG SUBL  Please advise.

## 2023-04-13 NOTE — Telephone Encounter (Signed)
Patient aware of message from [place] that Rx was re sent and to use GoodRx card if insurance does not cover.

## 2023-04-13 NOTE — Telephone Encounter (Signed)
Please advise message below looks as if requested Rx was discontinued.

## 2023-04-13 NOTE — Telephone Encounter (Signed)
RX sent again, let Teyah know if her insurance does not cover, she can try & use a GoodRX discount card without insurance.

## 2023-04-13 NOTE — Assessment & Plan Note (Signed)
chronic, unstable currently having about 12 per month and bad headaches on other days hx of Aimovig (worked well) Topamax (SE) and Metoprolol did not work Imitrex did not work, uses Maxalt now, but would prefer to get back on Aimovig samples of Emgality, explained in same class, try & let me know if effective f/u 1 month

## 2023-05-04 DIAGNOSIS — G4733 Obstructive sleep apnea (adult) (pediatric): Secondary | ICD-10-CM | POA: Diagnosis not present

## 2023-05-06 ENCOUNTER — Other Ambulatory Visit: Payer: Self-pay | Admitting: Family

## 2023-05-06 DIAGNOSIS — F4323 Adjustment disorder with mixed anxiety and depressed mood: Secondary | ICD-10-CM

## 2023-05-09 DIAGNOSIS — G4700 Insomnia, unspecified: Secondary | ICD-10-CM | POA: Diagnosis not present

## 2023-05-09 DIAGNOSIS — G894 Chronic pain syndrome: Secondary | ICD-10-CM | POA: Diagnosis not present

## 2023-05-09 DIAGNOSIS — M5030 Other cervical disc degeneration, unspecified cervical region: Secondary | ICD-10-CM | POA: Diagnosis not present

## 2023-05-10 ENCOUNTER — Ambulatory Visit: Payer: Medicaid Other | Admitting: Family

## 2023-05-10 ENCOUNTER — Encounter: Payer: Self-pay | Admitting: Family

## 2023-05-10 VITALS — BP 130/88 | HR 101 | Temp 97.7°F | Ht 63.0 in | Wt 122.2 lb

## 2023-05-10 DIAGNOSIS — G43709 Chronic migraine without aura, not intractable, without status migrainosus: Secondary | ICD-10-CM

## 2023-05-10 DIAGNOSIS — F4323 Adjustment disorder with mixed anxiety and depressed mood: Secondary | ICD-10-CM

## 2023-05-10 MED ORDER — ESCITALOPRAM OXALATE 10 MG PO TABS: 10.0000 mg | ORAL_TABLET | Freq: Every day | ORAL | 5 refills | Status: DC

## 2023-05-10 MED ORDER — EMGALITY 120 MG/ML ~~LOC~~ SOAJ
1.0000 | SUBCUTANEOUS | 5 refills | Status: DC
Start: 2023-05-10 — End: 2024-05-21

## 2023-05-10 MED ORDER — RIZATRIPTAN BENZOATE 10 MG PO TABS
ORAL_TABLET | ORAL | 5 refills | Status: DC
Start: 2023-05-10 — End: 2023-08-09

## 2023-05-10 MED ORDER — LORAZEPAM 1 MG PO TABS
0.5000 mg | ORAL_TABLET | Freq: Every day | ORAL | 0 refills | Status: DC | PRN
Start: 2023-05-10 — End: 2023-05-29

## 2023-05-10 NOTE — Assessment & Plan Note (Addendum)
chronic, Unstable started Lexapro 5mg  last visit, pt increased to 10mg  and thinks it is helping some, but has been having panic attacks r/t eviction, chronic pain, no job, financial problems sending refill of Lexapro and Lorazepm 1mg  prn, 20 pills, pt aware of SE, states her Suboxone provider has approved her taking PDMP checked & verified f/u 3-6 mos or prn

## 2023-05-10 NOTE — Progress Notes (Signed)
Patient ID: Laurie [name], female    DOB: Feb 25, 1966, 57 y.o.   MRN: [id number]  Chief Complaint  Patient presents with   Migraine   Anxiety   Depression    HPI: Anxiety and depression/Insomnia:  HX 03/2023:   reports hx of using Buspar, but states it caused muscle & stomach pain. Has taken Zolpidem for years and will use for a few months then has to switch to [place] as it stops working. Reports difficulty falling asleep & maintaining sleep. Reports having a lot of stress r/t to not working, getting ready to be evicted from home and she has to move in with her mother who mental health problems and is a [employer] loss adjuster.  Had been on disability but this is getting ready to run out and can't work now due to her neck pain, migraines, and stress. *Today, pt started on Lexapro and increased dose to 10mg  but she is having more panic attacks due to her eviction.  Migraines:  HX 03/2023:  reports hx of using Maxalt & Imitrex and Aimovig in past. Reports taking Topamax in past, caused stomach problems, also Metoprolol that did not work. Reports having bad headaches as well as the migraines, usually lasts a few days. States she has about 12/ month. Also reports nausea and light sensitivity. Reports strobe lights, stress, dehydration, denies food triggers, sometimes certain odors are triggers. *Today pt states the Emgality has been working for her.  Assessment & Plan:  Chronic migraine w/o aura w/o status migrainosus, not intractable Assessment & Plan: chronic, unstable given samples of Emgality, reports worked well will send RX, will require PA also refilling Maxalt f/u 3-6 months  Orders: -     Emgality; Inject 1 each into the skin every 30 (thirty) days.  Dispense: 1.12 mL; Refill: 5 -     Rizatriptan Benzoate; TAKE ONE TABLET BY MOUTH AS NEEDED FOR MIGRAINE. MAY REPEAT IN 2 HOURS IF NEEDED.  Dispense: 10 tablet; Refill: 5  Adjustment disorder with mixed anxiety and depressed mood Assessment &  Plan: chronic, Unstable started Lexapro 5mg  last visit, pt increased to 10mg  and thinks it is helping some, but has been having panic attacks r/t eviction, chronic pain, no job, financial problems sending refill of Lexapro and Lorazepm 1mg  prn, 20 pills, pt aware of SE, states her Suboxone provider has approved her taking PDMP checked & verified f/u 3-6 mos or prn   Orders: -     LORazepam; Take 0.5-1 tablets (0.5-1 mg total) by mouth daily as needed for anxiety.  Dispense: 20 tablet; Refill: 0 -     Escitalopram Oxalate; Take 1 tablet (10 mg total) by mouth daily.  Dispense: 30 tablet; Refill: 5   Subjective:    Outpatient Medications Prior to Visit  Medication Sig Dispense Refill   Buprenorphine HCl-Naloxone HCl 4-1 MG FILM Take 1 application by mouth in the morning, at noon, and at bedtime.     eszopiclone 3 MG TABS Take 3 mg by mouth at bedtime. Take immediately before bedtime     ondansetron (ZOFRAN-ODT) 8 MG disintegrating tablet Take 1 tablet (8 mg total) by mouth every 8 (eight) hours as needed for nausea or vomiting. 20 tablet 1   zolpidem (AMBIEN) 10 MG tablet Take 10 mg by mouth at bedtime as needed for sleep.     Zolpidem Tartrate 3.5 MG SUBL Place 3.5 mg under the tongue at bedtime as needed. For night time awakenings, 4 hours after initial dose. 30 tablet 2  escitalopram (LEXAPRO) 5 MG tablet TAKE 1 TABLET (5 MG TOTAL) BY MOUTH DAILY. 90 tablet 1   rizatriptan (MAXALT) 10 MG tablet TAKE ONE TABLET BY MOUTH AS NEEDED FOR MIGRAINE. MAY REPEAT IN 2 HOURS IF NEEDED. 10 tablet 1   No facility-administered medications prior to visit.   Past Medical History:  Diagnosis Date   Abnormal uterine bleeding    ADHD (attention deficit hyperactivity disorder), combined type 04/17/2018   Anemia    Anxiety    situational   Chest pain in adult 01/02/2017   Chronic daily headache 02/19/2014   Chronic neck pain    from disc dz   Dysmenorrhea    Endometriosis    Fatigue 04/27/2012    Fibroid    Hypertension 10/22/2019   Hypokalemia 02/26/2019   IBS (irritable bowel syndrome)    diarrhea predominant   Insomnia    organic sleep disorder, failed multiple meds   Lateral epicondylitis 04/17/2018   Low back pain 03/04/2015   R side  6 mo   No radiation    Migraine    Ovarian cyst 04/27/2012   Poor concentration 03/06/2018   Urinary incontinence    Vitamin D deficiency 03/04/2015   Past Surgical History:  Procedure Laterality Date   ABDOMINAL HYSTERECTOMY     ABDOMINAL HYSTERECTOMY     BREAST BIOPSY Right 03/31/2015   BREAST CYST ASPIRATION Right 03/31/2015   CERVICAL FUSION     2022   Deg C-S disk/spinal stenosis  06/2007   DILATION AND CURETTAGE OF UTERUS     ENDOMETRIAL ABLATION     epidural injections     KNEE SURGERY     PELVIC LAPAROSCOPY     TUBAL LIGATION     Allergies  Allergen Reactions   Latex Rash   Amlodipine     Pedal edema    Codeine     REACTION: vomiting   Codeine Phosphate     REACTION: vomiting   Crestor [Rosuvastatin]     Muscle pain    Lisinopril     Headache and severe nausea   Penicillins     REACTION: urticaria (hives) Did it involve swelling of the face/tongue/throat, SOB, or low BP? No Did it involve sudden or severe rash/hives, skin peeling, or any reaction on the inside of your mouth or nose? No Did you need to seek medical attention at a hospital or doctor's office? No When did it last happen?       If all above answers are "NO", may proceed with cephalosporin use.    Quinolones     Told not to take this for cardiac reasons      Objective:    Physical Exam Vitals and nursing note reviewed.  Constitutional:      Appearance: Normal appearance.  Cardiovascular:     Rate and Rhythm: Normal rate and regular rhythm.  Pulmonary:     Effort: Pulmonary effort is normal.     Breath sounds: Normal breath sounds.  Musculoskeletal:        General: Normal range of motion.  Skin:    General: Skin is warm and dry.   Neurological:     Mental Status: She is alert.  Psychiatric:        Mood and Affect: Mood normal.        Behavior: Behavior normal.    BP 130/88   Pulse (!) 101   Temp 97.7 F (36.5 C)   Ht 5\' 3"  (1.6 m)   Wt  122 lb 3.2 oz (55.4 kg)   LMP  (LMP Unknown)   SpO2 98%   BMI 21.65 kg/m  Wt Readings from Last 3 Encounters:  05/10/23 122 lb 3.2 oz (55.4 kg)  04/12/23 123 lb (55.8 kg)  01/18/22 135 lb (61.2 kg)      Dulce Sellar, NP

## 2023-05-10 NOTE — Assessment & Plan Note (Addendum)
chronic, unstable given samples of Emgality, reports worked well will send RX, will require PA also refilling Maxalt f/u 3-6 months

## 2023-05-15 ENCOUNTER — Ambulatory Visit: Payer: Medicaid Other | Admitting: Family

## 2023-05-15 ENCOUNTER — Other Ambulatory Visit (HOSPITAL_COMMUNITY): Payer: Self-pay

## 2023-05-15 ENCOUNTER — Telehealth: Payer: Self-pay

## 2023-05-15 NOTE — Telephone Encounter (Signed)
Pharmacy Patient Advocate Encounter   Received notification from Physician's Office that prior authorization for Emgality 120MG /ML auto-injectors is required/requested.   Insurance verification completed.   The patient is insured through [facility] .   Per test claim: PA required; PA submitted to [facility] via CoverMyMeds Key/confirmation #/EOC [id number] Status is pending

## 2023-05-16 ENCOUNTER — Encounter: Payer: Self-pay | Admitting: Radiology

## 2023-05-16 ENCOUNTER — Other Ambulatory Visit (HOSPITAL_COMMUNITY): Payer: Self-pay

## 2023-05-16 NOTE — Telephone Encounter (Signed)
Pharmacy Patient Advocate Encounter  Received notification from [facility] that Prior Authorization for Emgality 120MG /ML auto-injectors (migraine) has been APPROVED from 05/15/23 to 08/15/23. Ran test claim, Copay is $4.  PA #/Case ID/Reference #: [id number]

## 2023-05-23 ENCOUNTER — Other Ambulatory Visit: Payer: Self-pay | Admitting: Family

## 2023-05-23 DIAGNOSIS — F4323 Adjustment disorder with mixed anxiety and depressed mood: Secondary | ICD-10-CM

## 2023-05-24 ENCOUNTER — Other Ambulatory Visit: Payer: Self-pay | Admitting: Family

## 2023-05-24 ENCOUNTER — Telehealth: Payer: Self-pay | Admitting: Family

## 2023-05-24 DIAGNOSIS — F4323 Adjustment disorder with mixed anxiety and depressed mood: Secondary | ICD-10-CM

## 2023-05-24 NOTE — Telephone Encounter (Signed)
Remind Laurie [name] I gave her 20 tablets to take 1/2-1 pill daily, as needed, on 7/24, it has been 13 days (from first request). I advised pt in the office this would not be an ongoing refill due to the addiction potential of medication.

## 2023-05-24 NOTE — Telephone Encounter (Signed)
BEING EVICTED AND NEEDS THE RX AS SOON AS POSSIBLE.   Prescription Request  05/24/2023  LOV: 05/10/2023  What is the name of the medication or equipment?  LORazepam (ATIVAN) 1 MG tablet   Have you contacted your pharmacy to request a refill? Yes   Which pharmacy would you like this sent to?  [id number] [place], [place] - [address] [place] [address] Phone: [phone] Fax: [phone]    Patient notified that their request is being sent to the clinical staff for review and that they should receive a response within 2 business days.   Please advise at Mobile [phone] (mobile)

## 2023-05-25 NOTE — Telephone Encounter (Signed)
I called pt, pt states she would really like medication filled due to her situation. Advised pt of message below, pt verbalized understanding but Pt would like a call back as soon as possible.   Pt does use Mychart.

## 2023-05-29 MED ORDER — LORAZEPAM 1 MG PO TABS
0.5000 mg | ORAL_TABLET | Freq: Every day | ORAL | 0 refills | Status: AC | PRN
Start: 2023-05-29 — End: ?

## 2023-05-29 NOTE — Addendum Note (Signed)
Addended byDulce Sellar on: 05/29/2023 06:22 PM   Modules accepted: Orders

## 2023-05-29 NOTE — Telephone Encounter (Signed)
I sent in a refill, but this is last one. If having continued anxiety we need to discuss increasing dose of her Lexapro, or adding other less addicting meds, or I can send her to psychiatry. Thanks

## 2023-05-30 NOTE — Telephone Encounter (Signed)
Pt gave a verbalized understanding. ?

## 2023-06-01 ENCOUNTER — Other Ambulatory Visit: Payer: Self-pay | Admitting: Family

## 2023-06-01 DIAGNOSIS — F4323 Adjustment disorder with mixed anxiety and depressed mood: Secondary | ICD-10-CM

## 2023-06-04 DIAGNOSIS — G4733 Obstructive sleep apnea (adult) (pediatric): Secondary | ICD-10-CM | POA: Diagnosis not present

## 2023-07-05 ENCOUNTER — Ambulatory Visit (INDEPENDENT_AMBULATORY_CARE_PROVIDER_SITE_OTHER): Payer: Medicaid Other | Admitting: Family

## 2023-07-05 ENCOUNTER — Encounter: Payer: Self-pay | Admitting: Family

## 2023-07-05 VITALS — BP 114/76 | HR 66 | Temp 98.0°F | Ht 63.0 in | Wt 119.0 lb

## 2023-07-05 DIAGNOSIS — F4323 Adjustment disorder with mixed anxiety and depressed mood: Secondary | ICD-10-CM | POA: Diagnosis not present

## 2023-07-05 DIAGNOSIS — G4733 Obstructive sleep apnea (adult) (pediatric): Secondary | ICD-10-CM | POA: Diagnosis not present

## 2023-07-05 MED ORDER — ESCITALOPRAM OXALATE 20 MG PO TABS
20.0000 mg | ORAL_TABLET | Freq: Every day | ORAL | 5 refills | Status: DC
Start: 2023-07-05 — End: 2023-07-27

## 2023-07-05 MED ORDER — BUPROPION HCL ER (XL) 150 MG PO TB24
150.0000 mg | ORAL_TABLET | Freq: Every day | ORAL | 2 refills | Status: DC
Start: 2023-07-05 — End: 2023-07-27

## 2023-07-05 NOTE — Progress Notes (Signed)
Patient ID: Laurie [name], female    DOB: February 07, 1966, 57 y.o.   MRN: [id number]  Chief Complaint  Patient presents with   Depression    Pt would like to discuss Lexapro, which helped with Anxiety but not severe depression    *Discussed the use of AI scribe software for clinical note transcription with the patient, who gave verbal consent to proceed.  HPI: Anxiety and depression:  HX: reports hx of using Buspar, but states it caused muscle & stomach pain. Has taken Zolpidem for years and will use for a few months then has to switch to [place] as it stops working. Reports difficulty falling asleep & maintaining sleep. Reports having a lot of stress r/t to not working, getting evicted from her home and she has had to move in with her mother who mental health problems and is a [employer] loss adjuster.  Had been on disability but this is getting ready to run out and can't work now due to her neck pain, migraines, and stress. She reports she increased the Lexapro to 20mg  which has helped with anxiety, but the depression has not gotten better. She has experienced significant life stressors recently, including losing her apartment and moving in with her mother, which she describes as a "bad situation." She has become increasingly isolated, not wanting to leave her room or interact with others, and has lost 35 pounds since December. She also reports feeling out of shape and experiencing wheezing when carrying items down stairs. She expresses a desire to return to work but feels unable to do so at this time. She has a history of a neck fusion and has recently ended a five-year relationship, which she describes as contributing to her current depressive state. She expresses a desire to speak with a counselor or therapist.      Assessment & Plan:  Adjustment disorder with mixed anxiety and depressed mood Assessment & Plan: chronic, Unstable taking Lexapro 20mg  for last few weeks, has improved her anxiety but not her  depression. adding Bupropion 150mg  ER sending refill of Lexapro sending referral to therapy, pt understands she has to call to sch appt f/u 3 mos or prn   Orders: -     buPROPion HCl ER (XL); Take 1 tablet (150 mg total) by mouth daily.  Dispense: 30 tablet; Refill: 2 -     Escitalopram Oxalate; Take 1 tablet (20 mg total) by mouth daily.  Dispense: 30 tablet; Refill: 5   General Health Maintenance - -Patient is applying for food assistance programs and exploring potential gym membership like the [employer] that offers low income pricing for physical health improvement.    Subjective:    Outpatient Medications Prior to Visit  Medication Sig Dispense Refill   Buprenorphine HCl-Naloxone HCl 4-1 MG FILM Take 1 application by mouth in the morning, at noon, and at bedtime.     eszopiclone 3 MG TABS Take 3 mg by mouth at bedtime. Take immediately before bedtime     Galcanezumab-gnlm (EMGALITY) 120 MG/ML SOAJ Inject 1 each into the skin every 30 (thirty) days. 1.12 mL 5   LORazepam (ATIVAN) 1 MG tablet Take 0.5-1 tablets (0.5-1 mg total) by mouth daily as needed for anxiety. 20 tablet 0   ondansetron (ZOFRAN-ODT) 8 MG disintegrating tablet Take 1 tablet (8 mg total) by mouth every 8 (eight) hours as needed for nausea or vomiting. 20 tablet 1   rizatriptan (MAXALT) 10 MG tablet TAKE ONE TABLET BY MOUTH AS NEEDED FOR MIGRAINE. MAY REPEAT  IN 2 HOURS IF NEEDED. 10 tablet 5   zolpidem (AMBIEN) 10 MG tablet Take 10 mg by mouth at bedtime as needed for sleep.     Zolpidem Tartrate 3.5 MG SUBL Place 3.5 mg under the tongue at bedtime as needed. For night time awakenings, 4 hours after initial dose. 30 tablet 2   escitalopram (LEXAPRO) 10 MG tablet TAKE 1 TABLET BY MOUTH EVERY DAY 90 tablet 2   No facility-administered medications prior to visit.   Past Medical History:  Diagnosis Date   Abnormal uterine bleeding    ADHD (attention deficit hyperactivity disorder), combined type 04/17/2018   Anemia     Anxiety    situational   Chest pain in adult 01/02/2017   Chronic daily headache 02/19/2014   Chronic neck pain    from disc dz   Dysmenorrhea    Endometriosis    Fatigue 04/27/2012   Fibroid    Hypertension 10/22/2019   Hypokalemia 02/26/2019   IBS (irritable bowel syndrome)    diarrhea predominant   Insomnia    organic sleep disorder, failed multiple meds   Lateral epicondylitis 04/17/2018   Low back pain 03/04/2015   R side  6 mo   No radiation    Migraine    Ovarian cyst 04/27/2012   Poor concentration 03/06/2018   Stress reaction 04/04/2007   Qualifier: Diagnosis of   By: Milinda Antis MD, Colon Flattery        Urinary incontinence    Vitamin D deficiency 03/04/2015   Past Surgical History:  Procedure Laterality Date   ABDOMINAL HYSTERECTOMY     ABDOMINAL HYSTERECTOMY     BREAST BIOPSY Right 03/31/2015   BREAST CYST ASPIRATION Right 03/31/2015   CERVICAL FUSION     2022   Deg C-S disk/spinal stenosis  06/2007   DILATION AND CURETTAGE OF UTERUS     ENDOMETRIAL ABLATION     epidural injections     KNEE SURGERY     PELVIC LAPAROSCOPY     TUBAL LIGATION     Allergies  Allergen Reactions   Latex Rash   Amlodipine     Pedal edema    Codeine     REACTION: vomiting   Codeine Phosphate     REACTION: vomiting   Crestor [Rosuvastatin]     Muscle pain    Lisinopril     Headache and severe nausea   Penicillins     REACTION: urticaria (hives) Did it involve swelling of the face/tongue/throat, SOB, or low BP? No Did it involve sudden or severe rash/hives, skin peeling, or any reaction on the inside of your mouth or nose? No Did you need to seek medical attention at a hospital or doctor's office? No When did it last happen?       If all above answers are "NO", may proceed with cephalosporin use.    Quinolones     Told not to take this for cardiac reasons      Objective:    Physical Exam Vitals and nursing note reviewed.  Constitutional:      Appearance: Normal  appearance.  Cardiovascular:     Rate and Rhythm: Normal rate and regular rhythm.  Pulmonary:     Effort: Pulmonary effort is normal.     Breath sounds: Normal breath sounds.  Musculoskeletal:        General: Normal range of motion.  Skin:    General: Skin is warm and dry.  Neurological:     Mental Status: She  is alert.  Psychiatric:        Mood and Affect: Mood normal.        Behavior: Behavior normal.    BP 114/76 (BP Location: Left Arm, Patient Position: Sitting, Cuff Size: Normal)   Pulse 66   Temp 98 F (36.7 C) (Temporal)   Ht 5\' 3"  (1.6 m)   Wt 119 lb (54 kg)   LMP  (LMP Unknown)   SpO2 98%   BMI 21.08 kg/m  Wt Readings from Last 3 Encounters:  07/05/23 119 lb (54 kg)  05/10/23 122 lb 3.2 oz (55.4 kg)  04/12/23 123 lb (55.8 kg)       Dulce Sellar, NP

## 2023-07-05 NOTE — Patient Instructions (Addendum)
It was very nice to see you today!    I have sent the higher dose of Lexapro to your pharmacy.  I also sent in Bupropion to start, take this in the mornings. I have sent a referral to our therapy team for counseling. But you have to call their office to schedule your first appointment. Start with [facility] at [phone] if they can't schedule you quickly you can try [facility] at [phone].   Schedule a follow up visit for 3 months - ok if virtual.     PLEASE NOTE:  If you had any lab tests please let us know if you have not heard back within a few days. You may see your results on MyChart before we have a chance to review them but we will give you a call once they are reviewed by [place]. If we ordered any referrals today, please let us know if you have not heard from their office within the next week.

## 2023-07-05 NOTE — Assessment & Plan Note (Addendum)
chronic, Unstable taking Lexapro 20mg  for last few weeks, has improved her anxiety but not her depression. adding Bupropion 150mg  ER sending refill of Lexapro sending referral to therapy, pt understands she has to call to sch appt f/u 3 mos or prn

## 2023-07-27 ENCOUNTER — Other Ambulatory Visit: Payer: Self-pay | Admitting: Family

## 2023-07-27 DIAGNOSIS — F4323 Adjustment disorder with mixed anxiety and depressed mood: Secondary | ICD-10-CM

## 2023-08-02 ENCOUNTER — Telehealth: Payer: Self-pay | Admitting: Pharmacy Technician

## 2023-08-02 NOTE — Telephone Encounter (Signed)
Pharmacy Patient Advocate Encounter   Received notification from CoverMyMeds that prior authorization for Emgality 120MG /ML auto-injectors (migraine) is required/requested.   Insurance verification completed.   The patient is insured through [facility] .   Per test claim: PA required; PA submitted to [facility] via CoverMyMeds Key/confirmation #/EOC [id number] Status is pending

## 2023-08-04 DIAGNOSIS — G4733 Obstructive sleep apnea (adult) (pediatric): Secondary | ICD-10-CM | POA: Diagnosis not present

## 2023-08-08 ENCOUNTER — Telehealth: Payer: Self-pay | Admitting: Family

## 2023-08-08 NOTE — Telephone Encounter (Signed)
Prescription Request  08/08/2023  LOV: 07/05/2023  What is the name of the medication or equipment? Maxalt 10 mg AND Zofran   Have you contacted your pharmacy to request a refill? Yes   Which pharmacy would you like this sent to?  [id number] [place], [place] - [address] [place] [address] Phone: [phone] Fax: [phone]    Patient notified that their request is being sent to the clinical staff for review and that they should receive a response within 2 business days.   Please advise at Mobile [phone] (mobile)

## 2023-08-09 ENCOUNTER — Other Ambulatory Visit: Payer: Self-pay

## 2023-08-09 DIAGNOSIS — G43709 Chronic migraine without aura, not intractable, without status migrainosus: Secondary | ICD-10-CM

## 2023-08-09 MED ORDER — RIZATRIPTAN BENZOATE 10 MG PO TABS
ORAL_TABLET | ORAL | 5 refills | Status: DC
Start: 2023-08-09 — End: 2024-08-20

## 2023-08-09 MED ORDER — ONDANSETRON 8 MG PO TBDP
8.0000 mg | ORAL_TABLET | Freq: Three times a day (TID) | ORAL | 1 refills | Status: DC | PRN
Start: 2023-08-09 — End: 2024-01-18

## 2023-08-10 ENCOUNTER — Ambulatory Visit (INDEPENDENT_AMBULATORY_CARE_PROVIDER_SITE_OTHER): Payer: Medicaid Other | Admitting: Family

## 2023-08-10 ENCOUNTER — Encounter: Payer: Self-pay | Admitting: Family

## 2023-08-10 VITALS — BP 110/72 | HR 65 | Temp 97.8°F | Ht 63.0 in | Wt 125.0 lb

## 2023-08-10 DIAGNOSIS — J0100 Acute maxillary sinusitis, unspecified: Secondary | ICD-10-CM

## 2023-08-10 DIAGNOSIS — F5101 Primary insomnia: Secondary | ICD-10-CM | POA: Diagnosis not present

## 2023-08-10 DIAGNOSIS — L304 Erythema intertrigo: Secondary | ICD-10-CM

## 2023-08-10 DIAGNOSIS — G4730 Sleep apnea, unspecified: Secondary | ICD-10-CM

## 2023-08-10 DIAGNOSIS — J309 Allergic rhinitis, unspecified: Secondary | ICD-10-CM

## 2023-08-10 MED ORDER — CLOTRIMAZOLE 1 % EX CREA
1.0000 | TOPICAL_CREAM | Freq: Two times a day (BID) | CUTANEOUS | 0 refills | Status: AC
Start: 2023-08-10 — End: 2023-08-20

## 2023-08-10 MED ORDER — DOXYCYCLINE HYCLATE 100 MG PO TABS
100.0000 mg | ORAL_TABLET | Freq: Two times a day (BID) | ORAL | 0 refills | Status: AC
Start: 2023-08-10 — End: 2023-08-17

## 2023-08-10 MED ORDER — TRIAMCINOLONE ACETONIDE 55 MCG/ACT NA AERO
1.0000 | INHALATION_SPRAY | Freq: Every day | NASAL | 2 refills | Status: AC
Start: 2023-08-10 — End: ?

## 2023-08-10 MED ORDER — NYSTATIN 100000 UNIT/GM EX POWD
1.0000 | Freq: Two times a day (BID) | CUTANEOUS | 0 refills | Status: AC
Start: 2023-08-10 — End: ?

## 2023-08-10 MED ORDER — QUVIVIQ 25 MG PO TABS
1.0000 | ORAL_TABLET | Freq: Every day | ORAL | 5 refills | Status: DC
Start: 2023-09-08 — End: 2024-04-17

## 2023-08-10 NOTE — Assessment & Plan Note (Signed)
chronic hx of 10mg  Ambien, helps her fall asleep but wakes 4h later, alternates with Lunesta which doesn't help her fall asleep but she maintains her sleep better sending 3.5mg   SL Ambien for breakthru insomnia, advised on use & SE f/u 3 mos

## 2023-08-10 NOTE — Patient Instructions (Addendum)
It was very nice to see you today!   VISIT SUMMARY:  During today's visit, we discussed your sinus problems, insomnia, and a rash in your skin folds. We reviewed your symptoms and current treatments, and I have provided new recommendations to help manage these issues.  YOUR PLAN:  -SINUSITIS: Sinusitis is an inflammation of the sinuses, often caused by allergies or infections. You are experiencing sinus pressure, tooth pain, and ear popping. I recommend starting Nasacort twice daily for a few days, then once daily for the rest of the allergy season. If your symptoms do not improve, you can start the antibiotic.  -INSOMNIA/History of Sleep apnea: Insomnia is difficulty falling or staying asleep. You have been alternating between Ambien and Lunesta with limited success. We will submit a prior authorization for Quviviq, a new medication. Please avoid napping during the day or limit naps to 20-30 minutes at a time. I have reordered a sleep study and the sleep center will call you directly to schedule.  -INTERTRIGO: Intertrigo is a rash that occurs in skin folds, often worsened by heat and sweating. I have prescribed an antifungal cream and powder. Please keep the affected area clean and dry.  Schedule a 3 month follow up unless already scheduled.      PLEASE NOTE:  If you had any lab tests please let us know if you have not heard back within a few days. You may see your results on MyChart before we have a chance to review them but we will give you a call once they are reviewed by [place]. If we ordered any referrals today, please let us know if you have not heard from their office within the next week.

## 2023-08-10 NOTE — Progress Notes (Signed)
Patient ID: Laurie [name], female    DOB: 03/08/1966, 57 y.o.   MRN: [id number]  Chief Complaint  Patient presents with   Anxiety   Sinus Problem    Pt c/o of sinus infection, runny nose/stuffy nose, left ear pops, right ear she can feel her heart beating for 4 days. Pt has taken Sudafed and Benadryl. Pt took at home COVID test on 08/09/23 that was negative.    Insomnia    Pt would like to discuss starting a new sleeping medication   Discussed the use of AI scribe software for clinical note transcription with the patient, who gave verbal consent to proceed.  History of Present Illness   The patient, with a history of allergies and sleep apnea, presents with sinus problems and insomnia. The sinus issues started about a week ago, coinciding with the patient's usual allergy season. She has been self-medicating with Sudafed during the day and Benadryl at night. Symptoms include pain in the teeth, popping and hearing loss in the left ear, and constant heartbeat sound in the right ear. She also reports a runny and blocked nose, with mucus described as creamy and yellow-grayish in color.  The patient's insomnia has been persistent, despite alternating between Ambien and Lunesta. She sometimes can fall asleep but struggles to stay asleep. She has previously been diagnosed with sleep apnea after doing a home study and used a CPAP machine but found it difficult to use and she has lost 25-30lbs since then. She is wondering if doing a full study in a clinic would pick up other sleep disorders.   She also is reporting an ongoing rash between the upper crease of her breasts. Describes burning pain, itching. She has applied OTC cream and powder without much relief.  Assessment & Plan:     Sinusitis - Chronic sinusitis likely secondary to seasonal allergies. Symptoms include sinus pressure, tooth pain, and ear popping. Currently using Sudafed and Benadryl with limited relief. -Start Nasacort twice daily  for a few days, then once daily for the remainder of the allergy season. -Prescribed DOXY 100mg  bid x7d to be used if symptoms do not improve with Nasacort.  Insomnia - Chronic insomnia, currently alternating between Ambien and Lunesta with limited success. Patient interested in trying Qvivig. -Submit prior authorization for Quviviq. -Advise patient to avoid napping during the day or limit naps to 20-30 minutes. -Order new sleep study. Will research old study notes first.   Intertrigo - Patient reports rash in skin fold between breasts that worsens with heat and sweating. -Prescribed antifungal cream and powder, advised on use & SE, go w/o wearing bra when able, allow area to get air. -Advise patient to keep area clean and dry, apply cream first then powder bid. -RTO if sx are not improving.   Sleep Apnea-  pt dx last year w/OSA and given CPAP machine but was unable to tolerate the mask or nasal pillows so she stopped using. She has since lost 25lbs and thinks the weight may have been contributing to her sx, wanting to get a new in-office study to see if there is another disorder causing her to have poor sleep and daytime somnolence on a daily basis. -Sending new sleep study referral through Neurology office.  Subjective:    Outpatient Medications Prior to Visit  Medication Sig Dispense Refill   Buprenorphine HCl-Naloxone HCl 4-1 MG FILM Take 1 application by mouth in the morning, at noon, and at bedtime.     buPROPion (WELLBUTRIN XL)  150 MG 24 hr tablet TAKE 1 TABLET BY MOUTH EVERY DAY 90 tablet 1   escitalopram (LEXAPRO) 20 MG tablet TAKE 1 TABLET BY MOUTH EVERY DAY 90 tablet 2   Galcanezumab-gnlm (EMGALITY) 120 MG/ML SOAJ Inject 1 each into the skin every 30 (thirty) days. 1.12 mL 5   LORazepam (ATIVAN) 1 MG tablet Take 0.5-1 tablets (0.5-1 mg total) by mouth daily as needed for anxiety. 20 tablet 0   ondansetron (ZOFRAN-ODT) 8 MG disintegrating tablet Take 1 tablet (8 mg total) by  mouth every 8 (eight) hours as needed for nausea or vomiting. 20 tablet 1   rizatriptan (MAXALT) 10 MG tablet TAKE ONE TABLET BY MOUTH AS NEEDED FOR MIGRAINE. MAY REPEAT IN 2 HOURS IF NEEDED. 10 tablet 5   zolpidem (AMBIEN) 10 MG tablet Take 10 mg by mouth at bedtime as needed for sleep.     eszopiclone 3 MG TABS Take 3 mg by mouth at bedtime. Take immediately before bedtime (Patient not taking: Reported on 08/10/2023)     Zolpidem Tartrate 3.5 MG SUBL Place 3.5 mg under the tongue at bedtime as needed. For night time awakenings, 4 hours after initial dose. (Patient not taking: Reported on 08/10/2023) 30 tablet 2   No facility-administered medications prior to visit.   Past Medical History:  Diagnosis Date   Abnormal uterine bleeding    ADHD (attention deficit hyperactivity disorder), combined type 04/17/2018   Anemia    Anxiety    situational   Chest pain in adult 01/02/2017   Chronic daily headache 02/19/2014   Chronic neck pain    from disc dz   Dysmenorrhea    Endometriosis    Fatigue 04/27/2012   Fibroid    Hypertension 10/22/2019   Hypokalemia 02/26/2019   IBS (irritable bowel syndrome)    diarrhea predominant   Insomnia    organic sleep disorder, failed multiple meds   Lateral epicondylitis 04/17/2018   Low back pain 03/04/2015   R side  6 mo   No radiation    Migraine    Ovarian cyst 04/27/2012   Poor concentration 03/06/2018   Stress reaction 04/04/2007   Qualifier: Diagnosis of   By: Milinda Antis MD, Colon Flattery        Urinary incontinence    Vitamin D deficiency 03/04/2015   Past Surgical History:  Procedure Laterality Date   ABDOMINAL HYSTERECTOMY     ABDOMINAL HYSTERECTOMY     BREAST BIOPSY Right 03/31/2015   BREAST CYST ASPIRATION Right 03/31/2015   CERVICAL FUSION     2022   Deg C-S disk/spinal stenosis  06/2007   DILATION AND CURETTAGE OF UTERUS     ENDOMETRIAL ABLATION     epidural injections     KNEE SURGERY     PELVIC LAPAROSCOPY     TUBAL LIGATION      Allergies  Allergen Reactions   Latex Rash   Amlodipine     Pedal edema    Codeine     REACTION: vomiting   Codeine Phosphate     REACTION: vomiting   Crestor [Rosuvastatin]     Muscle pain    Lisinopril     Headache and severe nausea   Penicillins     REACTION: urticaria (hives) Did it involve swelling of the face/tongue/throat, SOB, or low BP? No Did it involve sudden or severe rash/hives, skin peeling, or any reaction on the inside of your mouth or nose? No Did you need to seek medical attention at a hospital  or doctor's office? No When did it last happen?       If all above answers are "NO", may proceed with cephalosporin use.    Quinolones     Told not to take this for cardiac reasons      Objective:    Physical Exam Vitals and nursing note reviewed.  Constitutional:      Appearance: Normal appearance. She is ill-appearing.     Interventions: Face mask in place.  HENT:     Right Ear: Tympanic membrane and ear canal normal.     Left Ear: Tympanic membrane and ear canal normal.     Nose:     Right Sinus: Frontal sinus tenderness present.     Left Sinus: Frontal sinus tenderness present.     Mouth/Throat:     Mouth: Mucous membranes are moist.     Pharynx: Posterior oropharyngeal erythema present. No pharyngeal swelling, oropharyngeal exudate or uvula swelling.     Tonsils: No tonsillar exudate or tonsillar abscesses.  Cardiovascular:     Rate and Rhythm: Normal rate and regular rhythm.  Pulmonary:     Effort: Pulmonary effort is normal.     Breath sounds: Normal breath sounds.  Chest:    Musculoskeletal:        General: Normal range of motion.  Lymphadenopathy:     Head:     Right side of head: No preauricular or posterior auricular adenopathy.     Left side of head: No preauricular or posterior auricular adenopathy.     Cervical: No cervical adenopathy.  Skin:    General: Skin is warm and dry.     Findings: Rash (erythema noted on chest between  breasts) present.  Neurological:     Mental Status: She is alert.  Psychiatric:        Mood and Affect: Mood normal.        Behavior: Behavior normal.    BP 110/72   Pulse 65   Temp 97.8 F (36.6 C)   Ht 5\' 3"  (1.6 m)   Wt 125 lb (56.7 kg)   LMP  (LMP Unknown)   SpO2 97%   BMI 22.14 kg/m  Wt Readings from Last 3 Encounters:  08/10/23 125 lb (56.7 kg)  07/05/23 119 lb (54 kg)  05/10/23 122 lb 3.2 oz (55.4 kg)       Dulce Sellar, NP

## 2023-08-11 DIAGNOSIS — G4730 Sleep apnea, unspecified: Secondary | ICD-10-CM | POA: Insufficient documentation

## 2023-08-11 DIAGNOSIS — J309 Allergic rhinitis, unspecified: Secondary | ICD-10-CM | POA: Insufficient documentation

## 2023-08-16 DIAGNOSIS — M5030 Other cervical disc degeneration, unspecified cervical region: Secondary | ICD-10-CM | POA: Diagnosis not present

## 2023-08-16 DIAGNOSIS — G894 Chronic pain syndrome: Secondary | ICD-10-CM | POA: Diagnosis not present

## 2023-08-16 DIAGNOSIS — G4700 Insomnia, unspecified: Secondary | ICD-10-CM | POA: Diagnosis not present

## 2023-09-29 NOTE — Telephone Encounter (Signed)
Pharmacy Patient Advocate Encounter  Received notification from [facility] that Prior Authorization for Emgality 120MG /ML auto-injectors (migraine)  has been APPROVED from 08/02/2023 to 08/01/2024   PA #/Case ID/Reference #: [id number]

## 2023-11-20 DIAGNOSIS — M5030 Other cervical disc degeneration, unspecified cervical region: Secondary | ICD-10-CM | POA: Diagnosis not present

## 2023-11-20 DIAGNOSIS — M79644 Pain in right finger(s): Secondary | ICD-10-CM | POA: Diagnosis not present

## 2023-11-20 DIAGNOSIS — G4700 Insomnia, unspecified: Secondary | ICD-10-CM | POA: Diagnosis not present

## 2023-11-20 DIAGNOSIS — G894 Chronic pain syndrome: Secondary | ICD-10-CM | POA: Diagnosis not present

## 2024-01-09 ENCOUNTER — Other Ambulatory Visit: Payer: Self-pay | Admitting: Family

## 2024-01-09 DIAGNOSIS — F4323 Adjustment disorder with mixed anxiety and depressed mood: Secondary | ICD-10-CM

## 2024-01-10 NOTE — Telephone Encounter (Signed)
 Pt requesting refill for Lorazepam 1 mg tablet. Last OV 08/10/2023.

## 2024-01-11 NOTE — Telephone Encounter (Signed)
 Because it is controlled, she needs OV, can she do a virtual?

## 2024-01-12 NOTE — Telephone Encounter (Signed)
 see my msg above

## 2024-01-12 NOTE — Telephone Encounter (Signed)
 Lov:08/10/2023 Quat:20 tablets Next visit:n/a Last filled:05/29/2023

## 2024-01-18 ENCOUNTER — Encounter: Payer: Self-pay | Admitting: Family

## 2024-01-18 ENCOUNTER — Other Ambulatory Visit: Payer: Self-pay | Admitting: Family

## 2024-01-18 DIAGNOSIS — G43709 Chronic migraine without aura, not intractable, without status migrainosus: Secondary | ICD-10-CM

## 2024-01-18 DIAGNOSIS — R92333 Mammographic heterogeneous density, bilateral breasts: Secondary | ICD-10-CM

## 2024-01-19 NOTE — Telephone Encounter (Signed)
 order sent, let pt know, thx

## 2024-01-22 ENCOUNTER — Encounter: Payer: Self-pay | Admitting: Family

## 2024-01-22 ENCOUNTER — Ambulatory Visit

## 2024-01-26 ENCOUNTER — Ambulatory Visit

## 2024-03-18 DIAGNOSIS — Z79899 Other long term (current) drug therapy: Secondary | ICD-10-CM | POA: Diagnosis not present

## 2024-03-18 DIAGNOSIS — M961 Postlaminectomy syndrome, not elsewhere classified: Secondary | ICD-10-CM | POA: Diagnosis not present

## 2024-03-18 DIAGNOSIS — M542 Cervicalgia: Secondary | ICD-10-CM | POA: Diagnosis not present

## 2024-03-18 DIAGNOSIS — M5030 Other cervical disc degeneration, unspecified cervical region: Secondary | ICD-10-CM | POA: Diagnosis not present

## 2024-03-18 DIAGNOSIS — G894 Chronic pain syndrome: Secondary | ICD-10-CM | POA: Diagnosis not present

## 2024-03-19 ENCOUNTER — Ambulatory Visit
Admission: RE | Admit: 2024-03-19 | Discharge: 2024-03-19 | Disposition: A | Discharge status: Home / Self Care | Source: Ambulatory Visit | Attending: Family | Admitting: Family

## 2024-03-19 DIAGNOSIS — R92333 Mammographic heterogeneous density, bilateral breasts: Secondary | ICD-10-CM

## 2024-03-19 DIAGNOSIS — Z1231 Encounter for screening mammogram for malignant neoplasm of breast: Secondary | ICD-10-CM | POA: Diagnosis not present

## 2024-03-23 ENCOUNTER — Encounter: Payer: Self-pay | Admitting: Family

## 2024-03-29 ENCOUNTER — Encounter: Payer: Self-pay | Admitting: Family

## 2024-03-29 ENCOUNTER — Ambulatory Visit (INDEPENDENT_AMBULATORY_CARE_PROVIDER_SITE_OTHER): Admitting: Family

## 2024-03-29 VITALS — BP 122/82 | HR 80 | Temp 98.2°F | Ht 63.0 in | Wt 151.2 lb

## 2024-03-29 DIAGNOSIS — G4730 Sleep apnea, unspecified: Secondary | ICD-10-CM | POA: Diagnosis not present

## 2024-03-29 DIAGNOSIS — M25561 Pain in right knee: Secondary | ICD-10-CM | POA: Diagnosis not present

## 2024-03-29 DIAGNOSIS — F4323 Adjustment disorder with mixed anxiety and depressed mood: Secondary | ICD-10-CM | POA: Diagnosis not present

## 2024-03-29 DIAGNOSIS — N959 Unspecified menopausal and perimenopausal disorder: Secondary | ICD-10-CM | POA: Diagnosis not present

## 2024-03-29 DIAGNOSIS — G8929 Other chronic pain: Secondary | ICD-10-CM | POA: Diagnosis not present

## 2024-03-29 DIAGNOSIS — N3941 Urge incontinence: Secondary | ICD-10-CM

## 2024-03-29 MED ORDER — LORAZEPAM 1 MG PO TABS
0.5000 mg | ORAL_TABLET | Freq: Every day | ORAL | 0 refills | Status: DC | PRN
Start: 2024-03-29 — End: 2024-05-01

## 2024-03-29 MED ORDER — ESTRADIOL 0.5 MG PO TABS
0.5000 mg | ORAL_TABLET | Freq: Every day | ORAL | 5 refills | Status: DC
Start: 2024-03-29 — End: 2024-06-26

## 2024-03-29 MED ORDER — LORAZEPAM 1 MG PO TABS
0.5000 mg | ORAL_TABLET | Freq: Every day | ORAL | 0 refills | Status: DC | PRN
Start: 2024-03-29 — End: 2024-03-29

## 2024-03-29 NOTE — Progress Notes (Unsigned)
 Patient ID: Laurie [name], female    DOB: 16-May-1966, 58 y.o.   MRN: [id number]  Chief Complaint  Patient presents with  . Excessive Sweating    Pt has different topic she would like to have addressed: sweating, sleep study(not being abe to sleep), labs bc she has not had labs doing in a long time, Dexa( mother has thyroid  issues)   . incontinence       Subjective:    Outpatient Medications Prior to Visit  Medication Sig Dispense Refill  . Buprenorphine HCl-Naloxone HCl 4-1 MG FILM Take 1 application by mouth in the morning, at noon, and at bedtime.    . buPROPion  (WELLBUTRIN  XL) 150 MG 24 hr tablet TAKE 1 TABLET BY MOUTH EVERY DAY 90 tablet 1  . Daridorexant  HCl (QUVIVIQ ) 25 MG TABS Take 1 tablet (25 mg total) by mouth at bedtime. 30 tablet 5  . escitalopram  (LEXAPRO ) 20 MG tablet TAKE 1 TABLET BY MOUTH EVERY DAY 90 tablet 2  . eszopiclone 3 MG TABS Take 3 mg by mouth at bedtime. Take immediately before bedtime (Patient not taking: Reported on 08/10/2023)    . Galcanezumab -gnlm (EMGALITY ) 120 MG/ML SOAJ Inject 1 each into the skin every 30 (thirty) days. 1.12 mL 5  . LORazepam  (ATIVAN ) 1 MG tablet Take 0.5-1 tablets (0.5-1 mg total) by mouth daily as needed for anxiety. 20 tablet 0  . nystatin  (MYCOSTATIN /NYSTOP ) powder Apply 1 Application topically 2 (two) times daily. Apply to skin rash over antifungal cream 15 g 0  . ondansetron  (ZOFRAN -ODT) 8 MG disintegrating tablet TAKE 1 TABLET BY MOUTH EVERY 8 HOURS AS NEEDED FOR NAUSEA OR VOMITING. 20 tablet 1  . rizatriptan  (MAXALT ) 10 MG tablet TAKE ONE TABLET BY MOUTH AS NEEDED FOR MIGRAINE. MAY REPEAT IN 2 HOURS IF NEEDED. 10 tablet 5  . triamcinolone  (NASACORT ) 55 MCG/ACT AERO nasal inhaler Place 1 spray into the nose daily. Start with 1 spray each side twice a day for 3 days, then reduce to daily. 1 each 2  . zolpidem  (AMBIEN ) 10 MG tablet Take 10 mg by mouth at bedtime as needed for sleep.     No facility-administered medications prior  to visit.   Past Medical History:  Diagnosis Date  . Abnormal uterine bleeding   . ADHD (attention deficit hyperactivity disorder), combined type 04/17/2018  . Anemia   . Anxiety    situational  . Chest pain in adult 01/02/2017  . Chronic daily headache 02/19/2014  . Chronic neck pain    from disc dz  . Dysmenorrhea   . Endometriosis   . Fatigue 04/27/2012  . Fibroid   . Hypertension 10/22/2019  . Hypokalemia 02/26/2019  . IBS (irritable bowel syndrome)    diarrhea predominant  . Insomnia    organic sleep disorder, failed multiple meds  . Lateral epicondylitis 04/17/2018  . Low back pain 03/04/2015   R side  6 mo   No radiation   . Migraine   . Ovarian cyst 04/27/2012  . Poor concentration 03/06/2018  . Stress reaction 04/04/2007   Qualifier: Diagnosis of   By: Malissa Se MD, Dannette Dutch       . Urinary incontinence   . Vitamin D deficiency 03/04/2015   Past Surgical History:  Procedure Laterality Date  . ABDOMINAL HYSTERECTOMY    . ABDOMINAL HYSTERECTOMY    . BREAST BIOPSY Right 03/31/2015  . BREAST CYST ASPIRATION Right 03/31/2015  . CERVICAL FUSION     2022  . Deg C-S disk/spinal  stenosis  06/2007  . DILATION AND CURETTAGE OF UTERUS    . ENDOMETRIAL ABLATION    . epidural injections    . KNEE SURGERY    . PELVIC LAPAROSCOPY    . TUBAL LIGATION     Allergies  Allergen Reactions  . Latex Rash  . Amlodipine      Pedal edema   . Codeine     REACTION: vomiting  . Codeine Phosphate     REACTION: vomiting  . Crestor  [Rosuvastatin ]     Muscle pain   . Lisinopril      Headache and severe nausea  . Penicillins     REACTION: urticaria (hives) Did it involve swelling of the face/tongue/throat, SOB, or low BP? No Did it involve sudden or severe rash/hives, skin peeling, or any reaction on the inside of your mouth or nose? No Did you need to seek medical attention at a hospital or doctor's office? No When did it last happen?       If all above answers are "NO", may  proceed with cephalosporin use.   . Quinolones     Told not to take this for cardiac reasons      Objective:    Physical Exam Vitals and nursing note reviewed.  Constitutional:      Appearance: Normal appearance.   Cardiovascular:     Rate and Rhythm: Normal rate and regular rhythm.  Pulmonary:     Effort: Pulmonary effort is normal.     Breath sounds: Normal breath sounds.   Musculoskeletal:        General: Normal range of motion.   Skin:    General: Skin is warm and dry.   Neurological:     Mental Status: She is alert.   Psychiatric:        Mood and Affect: Mood normal.        Behavior: Behavior normal.   LMP  (LMP Unknown)  Wt Readings from Last 3 Encounters:  08/10/23 125 lb (56.7 kg)  07/05/23 119 lb (54 kg)  05/10/23 122 lb 3.2 oz (55.4 kg)      Versa Gore, NP

## 2024-04-05 NOTE — Assessment & Plan Note (Signed)
 Difficulty with CPAP. Previous tests show significant apnea. Weight gain may worsen condition. Discussed ENT evaluation and alternative treatments. - Refer to pulmonary for in-hospital sleep study when ready. - Consider ENT evaluation for anatomical causes.

## 2024-04-05 NOTE — Assessment & Plan Note (Addendum)
 Postmenopausal with hot flashes and sleep disturbances. No contraindications for hormone therapy. Discussed estradiol benefits.Last [facility] level well above postmenopause limits. Needs bone density scan.Hx of complete hysterectomy. Mammogram utd. No personal breast or ovarian cancer hx or in immediate family. - Prescribe estradiol 0.5 mg daily, advised on use & SE. - F/U in 4 weeks

## 2024-04-05 NOTE — Assessment & Plan Note (Signed)
 Stress and urge incontinence possibly related to endometriosis history. Discussed urological evaluation. - Refer to gynecological urologist for evaluation.

## 2024-04-17 ENCOUNTER — Ambulatory Visit: Admitting: Family

## 2024-04-17 ENCOUNTER — Encounter: Payer: Self-pay | Admitting: Family

## 2024-04-17 VITALS — BP 110/72 | HR 67 | Temp 98.1°F | Ht 63.0 in | Wt 149.8 lb

## 2024-04-17 DIAGNOSIS — Z13220 Encounter for screening for lipoid disorders: Secondary | ICD-10-CM

## 2024-04-17 DIAGNOSIS — Z0000 Encounter for general adult medical examination without abnormal findings: Secondary | ICD-10-CM | POA: Diagnosis not present

## 2024-04-17 DIAGNOSIS — N959 Unspecified menopausal and perimenopausal disorder: Secondary | ICD-10-CM | POA: Diagnosis not present

## 2024-04-17 DIAGNOSIS — R232 Flushing: Secondary | ICD-10-CM | POA: Diagnosis not present

## 2024-04-17 DIAGNOSIS — F5101 Primary insomnia: Secondary | ICD-10-CM | POA: Diagnosis not present

## 2024-04-17 DIAGNOSIS — G4709 Other insomnia: Secondary | ICD-10-CM

## 2024-04-17 DIAGNOSIS — Z1159 Encounter for screening for other viral diseases: Secondary | ICD-10-CM

## 2024-04-17 LAB — CBC WITH DIFFERENTIAL/PLATELET
Basophils Absolute: 0 10*3/uL (ref 0.0–0.1)
Basophils Relative: 0.9 % (ref 0.0–3.0)
Eosinophils Absolute: 0 10*3/uL (ref 0.0–0.7)
Eosinophils Relative: 0.8 % (ref 0.0–5.0)
HCT: 39.8 % (ref 36.0–46.0)
Hemoglobin: 13.2 g/dL (ref 12.0–15.0)
Lymphocytes Relative: 34.2 % (ref 12.0–46.0)
Lymphs Abs: 1.7 10*3/uL (ref 0.7–4.0)
MCHC: 33 g/dL (ref 30.0–36.0)
MCV: 88.4 fl (ref 78.0–100.0)
Monocytes Absolute: 0.3 10*3/uL (ref 0.1–1.0)
Monocytes Relative: 6.9 % (ref 3.0–12.0)
Neutro Abs: 2.8 10*3/uL (ref 1.4–7.7)
Neutrophils Relative %: 57.2 % (ref 43.0–77.0)
Platelets: 208 10*3/uL (ref 150.0–400.0)
RBC: 4.51 Mil/uL (ref 3.87–5.11)
RDW: 13.5 % (ref 11.5–15.5)
WBC: 4.8 10*3/uL (ref 4.0–10.5)

## 2024-04-17 LAB — TSH: TSH: 1.06 u[IU]/mL (ref 0.35–5.50)

## 2024-04-17 LAB — LIPID PANEL
Cholesterol: 176 mg/dL (ref 0–200)
HDL: 44.4 mg/dL (ref >39.00)
LDL Cholesterol: 99 mg/dL (ref 0–99)
NonHDL: 131.69
Total CHOL/HDL Ratio: 4
Triglycerides: 164 mg/dL — ABNORMAL HIGH (ref 0.0–149.0)
VLDL: 32.8 mg/dL (ref 0.0–40.0)

## 2024-04-17 LAB — COMPREHENSIVE METABOLIC PANEL WITH GFR
ALT: 24 U/L (ref 0–35)
AST: 27 U/L (ref 0–37)
Albumin: 4.2 g/dL (ref 3.5–5.2)
Alkaline Phosphatase: 59 U/L (ref 39–117)
BUN: 11 mg/dL (ref 6–23)
CO2: 28 meq/L (ref 19–32)
Calcium: 8.8 mg/dL (ref 8.4–10.5)
Chloride: 105 meq/L (ref 96–112)
Creatinine, Ser: 0.75 mg/dL (ref 0.40–1.20)
GFR: 87.75 mL/min (ref >60.00)
Glucose, Bld: 105 mg/dL — ABNORMAL HIGH (ref 70–99)
Potassium: 3.9 meq/L (ref 3.5–5.1)
Sodium: 141 meq/L (ref 135–145)
Total Bilirubin: 1.1 mg/dL (ref 0.2–1.2)
Total Protein: 6.7 g/dL (ref 6.0–8.3)

## 2024-04-17 MED ORDER — CLONIDINE HCL 0.1 MG PO TABS: 0.1000 mg | ORAL_TABLET | Freq: Every day | ORAL | 5 refills | Status: DC

## 2024-04-17 MED ORDER — QUVIVIQ 25 MG PO TABS
1.0000 | ORAL_TABLET | Freq: Every day | ORAL | 5 refills | Status: AC
Start: 2024-04-17 — End: ?

## 2024-04-17 NOTE — Progress Notes (Signed)
 Phone [phone]  Subjective:   Patient is a 58 y.o. female presenting for annual physical.    Chief Complaint  Patient presents with   Annual Exam    States she had a hysterectomy when I asked her about and OB and her pap.   Discussed the use of AI scribe software for clinical note transcription with the patient, who gave verbal consent to proceed.  History of Present Illness Laurie [name] is a 58 year old female who presents for an annual physical exam.  Vasomotor symptoms - Excessive sweating, particularly in the mornings at work - Sweating attributed to humid environment and lack of air conditioning - Ongoing hot flashes - Estradiol  has provided slight improvement in sweating  Sleep disturbance - Difficulty sleeping - Has used Ambien  for sleep in the past - Has not tried a new sleep medication due to concerns about side effects impacting her new job  Weight gain and sleep apnea concerns - Current weight is 150 pounds, previously 120-125 pounds - Concerned about possible return of sleep apnea with weight gain - Sleep apnea had resolved with previous weight loss - Unable to tolerate CPAP in the past due to sensation of choking  Mood and anxiety symptoms - History of depression, previously associated with significant weight loss - Currently takes Ativan  for anxiety, which helps with nervousness and sweating at work   See problem oriented charting- ROS- full  review of systems was completed and negative except for: what is noted in HPI above.  The following were reviewed and entered/updated in epic: Past Medical History:  Diagnosis Date   Abnormal uterine bleeding    ADHD (attention deficit hyperactivity disorder), combined type 04/17/2018   Anemia    Anxiety    situational   Chest pain in adult 01/02/2017   Chronic daily headache 02/19/2014   Chronic neck pain    from disc dz   Dysmenorrhea    Endometriosis    Fatigue 04/27/2012   Fibroid    Hypertension  10/22/2019   Hypokalemia 02/26/2019   IBS (irritable bowel syndrome)    diarrhea predominant   Insomnia    organic sleep disorder, failed multiple meds   Lateral epicondylitis 04/17/2018   Low back pain 03/04/2015   R side  6 mo   No radiation    Migraine    Ovarian cyst 04/27/2012   Poor concentration 03/06/2018   Stress reaction 04/04/2007   Qualifier: Diagnosis of   By: Randeen MD, Laine Caldron        Urinary incontinence    Vitamin D deficiency 03/04/2015   Patient Active Problem List   Diagnosis Date Noted   Postmenopausal symptoms 03/29/2024   Chronic pain of right knee 03/29/2024   Urge incontinence of urine 03/29/2024   Sleep apnea 08/11/2023   Allergic rhinitis 08/11/2023   Chronic migraine w/o aura w/o status migrainosus, not intractable 02/26/2019   Hyperlipidemia 06/07/2018   Menopause syndrome 04/17/2018   IBS (irritable bowel syndrome) 09/04/2017   Adjustment disorder with mixed anxiety and depressed mood 05/25/2016   Insomnia 06/06/2007   DEGENERATIVE DISC DISEASE 06/05/2007   Cervical stenosis of spine 04/04/2007   Past Surgical History:  Procedure Laterality Date   ABDOMINAL HYSTERECTOMY     ABDOMINAL HYSTERECTOMY     BREAST BIOPSY Right 03/31/2015   BREAST CYST ASPIRATION Right 03/31/2015   CERVICAL FUSION     2022   Deg C-S disk/spinal stenosis  06/2007   DILATION AND CURETTAGE OF UTERUS  ENDOMETRIAL ABLATION     epidural injections     KNEE SURGERY     PELVIC LAPAROSCOPY     TUBAL LIGATION      Family History  Problem Relation Age of Onset   Thyroid  disease Mother        Living, 31   COPD Father        Deceased, 61   Heart disease Father        CAD   Migraines Father    Heart attack Father    Migraines Daughter    Breast cancer Maternal Grandmother        80s   Heart attack Maternal Grandfather    Heart disease Paternal Grandmother    Migraines Brother    Heart disease Other        CAD   Heart disease Other        massive MI     Medications- reviewed and updated Current Outpatient Medications  Medication Sig Dispense Refill   Buprenorphine HCl-Naloxone HCl 4-1 MG FILM Take 1 application by mouth in the morning, at noon, and at bedtime.     cloNIDine (CATAPRES) 0.1 MG tablet Take 1 tablet (0.1 mg total) by mouth at bedtime. For hot flashes and/or night sweats. 30 tablet 5   estradiol  (ESTRACE ) 0.5 MG tablet Take 1 tablet (0.5 mg total) by mouth daily. 30 tablet 5   Galcanezumab -gnlm (EMGALITY ) 120 MG/ML SOAJ Inject 1 each into the skin every 30 (thirty) days. 1.12 mL 5   LORazepam  (ATIVAN ) 1 MG tablet Take 0.5-1 tablets (0.5-1 mg total) by mouth daily as needed for anxiety. 20 tablet 0   meloxicam  (MOBIC ) 15 MG tablet Take 15 mg by mouth daily.     nystatin  (MYCOSTATIN /NYSTOP ) powder Apply 1 Application topically 2 (two) times daily. Apply to skin rash over antifungal cream 15 g 0   ondansetron  (ZOFRAN -ODT) 8 MG disintegrating tablet TAKE 1 TABLET BY MOUTH EVERY 8 HOURS AS NEEDED FOR NAUSEA OR VOMITING. 20 tablet 1   rizatriptan  (MAXALT ) 10 MG tablet TAKE ONE TABLET BY MOUTH AS NEEDED FOR MIGRAINE. MAY REPEAT IN 2 HOURS IF NEEDED. 10 tablet 5   SUBOXONE 8-2 MG FILM Place 1 film 3 times a day by sublingual route as needed.     Daridorexant  HCl (QUVIVIQ ) 25 MG TABS Take 1 tablet (25 mg total) by mouth at bedtime. 30 tablet 5   triamcinolone  (NASACORT ) 55 MCG/ACT AERO nasal inhaler Place 1 spray into the nose daily. Start with 1 spray each side twice a day for 3 days, then reduce to daily. (Patient not taking: Reported on 04/17/2024) 1 each 2   No current facility-administered medications for this visit.    Allergies-reviewed and updated Allergies  Allergen Reactions   Latex Rash   Amlodipine      Pedal edema    Codeine     REACTION: vomiting   Codeine Phosphate     REACTION: vomiting   Crestor  [Rosuvastatin ]     Muscle pain    Lisinopril      Headache and severe nausea   Penicillins     REACTION: urticaria  (hives) Did it involve swelling of the face/tongue/throat, SOB, or low BP? No Did it involve sudden or severe rash/hives, skin peeling, or any reaction on the inside of your mouth or nose? No Did you need to seek medical attention at a hospital or doctor's office? No When did it last happen?       If all above answers  are "NO", may proceed with cephalosporin use.    Quinolones     Told not to take this for cardiac reasons    Social History   Social History Narrative   New P/T job with [employer].   She lives with her mother (very stressful)   Highest level of education:  Some college.  Has 3 daughters.   Right handed    Objective:  BP 110/72   Pulse 67   Temp 98.1 F (36.7 C) (Temporal)   Ht 5' 3 (1.6 m)   Wt 149 lb 12.8 oz (67.9 kg)   LMP  (LMP Unknown)   SpO2 94%   BMI 26.54 kg/m  Physical Exam Vitals and nursing note reviewed.  Constitutional:      Appearance: Normal appearance.  HENT:     Head: Normocephalic.     Right Ear: Tympanic membrane normal.     Left Ear: Tympanic membrane normal.     Nose: Nose normal.     Mouth/Throat:     Mouth: Mucous membranes are moist.  Eyes:     Pupils: Pupils are equal, round, and reactive to light.  Cardiovascular:     Rate and Rhythm: Normal rate and regular rhythm.  Pulmonary:     Effort: Pulmonary effort is normal.     Breath sounds: Normal breath sounds.  Musculoskeletal:        General: Normal range of motion.     Cervical back: Normal range of motion.  Lymphadenopathy:     Cervical: No cervical adenopathy.  Skin:    General: Skin is warm and dry.  Neurological:     Mental Status: She is alert.  Psychiatric:        Mood and Affect: Mood normal.        Behavior: Behavior normal.     Assessment and Plan   Health Maintenance counseling: 1. Anticipatory guidance: Patient counseled regarding regular dental exams q6 months, eye exams,  avoiding smoking and second hand smoke, limiting alcohol to 1  beverage per day, no illicit drugs.   2. Risk factor reduction:  Advised patient of need for regular exercise and diet rich with fruits and vegetables to reduce risk of heart attack and stroke. Exercise- none.  Wt Readings from Last 3 Encounters:  04/17/24 149 lb 12.8 oz (67.9 kg)  03/29/24 151 lb 3.2 oz (68.6 kg)  08/10/23 125 lb (56.7 kg)   3. Immunizations/screenings/ancillary studies Immunization History  Administered Date(s) Administered   Influenza Inj Mdck Quad Pf 06/24/2018   Influenza,inj,Quad PF,6+ Mos 11/15/2019   Influenza-Unspecified 07/13/2018   PFIZER(Purple Top)SARS-COV-2 Vaccination 08/22/2020   Health Maintenance Due  Topic Date Due   Hepatitis C Screening  Never done   Hepatitis B Vaccines (1 of 3 - 19+ 3-dose series) Never done   Zoster Vaccines- Shingrix (1 of 2) Never done   Cervical Cancer Screening (HPV/Pap Cotest)  Never done   COVID-19 Vaccine (2 - Pfizer risk series) 09/12/2020    4. Cervical cancer screening- hysterectomy 5. Breast cancer screening-  mammogram last done 03/2024 6. Colon cancer screening - done in 2017, normal, due again in 2027 7. Skin cancer screening- advised regular sunscreen use. Denies worrisome, changing, or new skin lesions.  8. Birth control/STD check- none - started HRT recently 9. Osteoporosis screening- N/A 10. Alcohol screening: none 11. Smoking associated screening (lung cancer screening, AAA screen 65-75, UA)- never smoked Assessment & Plan Menopausal symptoms Hot flashes and sweating improved, but not at  goal on estradiol . Discussed clonidine for symptom management with potential side effects of drowsiness and dizziness. - Prescribe clonidine at the lowest dose at bedtime. - Continue estradiol  therapy 0.5mg  qd - F/U in 2 months  Sleep disturbances/Insomnia Sleep disturbances persist despite Ambien  & Lunesta. Discussed retrying Quviviq  without new prior authorization concerns. - Resend prescription for Quviviq  to  pharmacy. - Instructed to inform office if new prior authorization is needed. - F/U 2 mos  Weight gain - Weight increased to 150 lbs. Discussed calorie management and exercise. Estradiol  may assist with weight management. - Advised on calorie management, portion control, timing of eating, low carb diet and regular exercise. - Continue estradiol  therapy. - F/U 2 mos  General Health Maintenance Up to date with mammogram and colonoscopy. Emphasized regular dermatology check-ups due to past skin concerns. - Order full CPE lab panel today - Schedule dermatology appointments every three years. - Continue regular health screenings as per schedule.  Recommended follow up:  Return in about 2 months (around 06/18/2024) for med refills. No future appointments.  Lab/Order associations: fasting   Laurie Krabbe, NP

## 2024-04-17 NOTE — Patient Instructions (Addendum)
 It was very nice to see you today!   I will review your lab results via MyChart in a few days.  I have sent in Clonidine (hot flashes/sweats) and the Quviviq  to your pharmacy.  Please schedule a 2 month follow up visit.  Congrats on the new job :-)    PLEASE NOTE:  If you had any lab tests please let us  know if you have not heard back within a few days. You may see your results on MyChart before we have a chance to review them but we will give you a call once they are reviewed by us . If we ordered any referrals today, please let us  know if you have not heard from their office within the next week.

## 2024-04-17 NOTE — Assessment & Plan Note (Signed)
 Hot flashes and sweating improved, but not at goal on estradiol . Discussed clonidine for symptom management with potential side effects of drowsiness and dizziness. - Prescribe clonidine at the lowest dose at bedtime. - Continue estradiol  therapy 0.5mg  qd - F/U in 2 months

## 2024-04-17 NOTE — Assessment & Plan Note (Signed)
 Sleep disturbances persist despite Ambien  & Lunesta. Discussed retrying Quviviq  without new prior authorization concerns. - Resend prescription for Quviviq  to pharmacy. - Instructed to inform office if new prior authorization is needed. - F/U 2 mos

## 2024-04-18 LAB — HIV ANTIBODY (ROUTINE TESTING W REFLEX): HIV 1&2 Ab, 4th Generation: NONREACTIVE

## 2024-04-18 LAB — HEPATITIS C ANTIBODY: Hepatitis C Ab: NONREACTIVE

## 2024-04-20 ENCOUNTER — Other Ambulatory Visit: Payer: Self-pay | Admitting: Family

## 2024-04-20 DIAGNOSIS — N959 Unspecified menopausal and perimenopausal disorder: Secondary | ICD-10-CM

## 2024-04-21 ENCOUNTER — Ambulatory Visit: Payer: Self-pay | Admitting: Family

## 2024-05-01 ENCOUNTER — Encounter: Payer: Self-pay | Admitting: Family

## 2024-05-01 DIAGNOSIS — F4323 Adjustment disorder with mixed anxiety and depressed mood: Secondary | ICD-10-CM

## 2024-05-01 MED ORDER — LORAZEPAM 1 MG PO TABS: 0.5000 mg | ORAL_TABLET | Freq: Every day | ORAL | 2 refills | Status: AC | PRN

## 2024-05-01 NOTE — Telephone Encounter (Signed)
 RX sent

## 2024-05-21 ENCOUNTER — Other Ambulatory Visit: Payer: Self-pay | Admitting: Family

## 2024-05-21 DIAGNOSIS — G43709 Chronic migraine without aura, not intractable, without status migrainosus: Secondary | ICD-10-CM

## 2024-05-23 ENCOUNTER — Other Ambulatory Visit: Payer: Self-pay | Admitting: Family

## 2024-05-23 DIAGNOSIS — N959 Unspecified menopausal and perimenopausal disorder: Secondary | ICD-10-CM

## 2024-06-25 ENCOUNTER — Other Ambulatory Visit: Payer: Self-pay | Admitting: Family

## 2024-06-25 DIAGNOSIS — N959 Unspecified menopausal and perimenopausal disorder: Secondary | ICD-10-CM

## 2024-07-07 ENCOUNTER — Other Ambulatory Visit: Payer: Self-pay | Admitting: Family

## 2024-07-07 DIAGNOSIS — G43709 Chronic migraine without aura, not intractable, without status migrainosus: Secondary | ICD-10-CM

## 2024-07-09 ENCOUNTER — Other Ambulatory Visit: Payer: Self-pay | Admitting: Family

## 2024-07-09 DIAGNOSIS — F4323 Adjustment disorder with mixed anxiety and depressed mood: Secondary | ICD-10-CM

## 2024-07-12 ENCOUNTER — Other Ambulatory Visit: Payer: Self-pay | Admitting: Family

## 2024-07-12 DIAGNOSIS — F4323 Adjustment disorder with mixed anxiety and depressed mood: Secondary | ICD-10-CM

## 2024-07-12 NOTE — Telephone Encounter (Signed)
 Needs OV.

## 2024-07-12 NOTE — Telephone Encounter (Signed)
 Copied from CRM #[id number]. Topic: Clinical - Medication Refill >> Jul 12, 2024 10:24 AM [place] E wrote: Medication: LORazepam  (ATIVAN ) 1 MG tablet  Has the patient contacted their pharmacy? Yes (Agent: If no, request that the patient contact the pharmacy for the refill. If patient does not wish to contact the pharmacy document the reason why and proceed with request.) (Agent: If yes, when and what did the pharmacy advise?)  This is the patient's preferred pharmacy:  [id number] - [place], [place] - [address] [facility] [place] [address] Phone: [phone] Fax: [phone]   Is this the correct pharmacy for this prescription? Yes If no, delete pharmacy and type the correct one.   Has the prescription been filled recently? No  Is the patient out of the medication? Yes  Has the patient been seen for an appointment in the last year OR does the patient have an upcoming appointment? Yes  Can we respond through MyChart? Yes  Agent: Please be advised that Rx refills may take up to 3 business days. We ask that you follow-up with your pharmacy.

## 2024-07-12 NOTE — Telephone Encounter (Signed)
 04/17/2024 LOV  05/01/2024  20/20 refills

## 2024-08-01 DIAGNOSIS — M961 Postlaminectomy syndrome, not elsewhere classified: Secondary | ICD-10-CM | POA: Diagnosis not present

## 2024-08-01 DIAGNOSIS — S025XXA Fracture of tooth (traumatic), initial encounter for closed fracture: Secondary | ICD-10-CM | POA: Diagnosis not present

## 2024-08-19 ENCOUNTER — Other Ambulatory Visit: Payer: Self-pay | Admitting: Family

## 2024-08-19 DIAGNOSIS — G43709 Chronic migraine without aura, not intractable, without status migrainosus: Secondary | ICD-10-CM

## 2024-10-03 ENCOUNTER — Other Ambulatory Visit: Payer: Self-pay | Admitting: Family

## 2024-10-03 DIAGNOSIS — F4323 Adjustment disorder with mixed anxiety and depressed mood: Secondary | ICD-10-CM

## 2024-10-23 ENCOUNTER — Other Ambulatory Visit: Payer: Self-pay | Admitting: Family

## 2024-10-23 DIAGNOSIS — G43709 Chronic migraine without aura, not intractable, without status migrainosus: Secondary | ICD-10-CM

## 2024-10-30 ENCOUNTER — Other Ambulatory Visit: Payer: Self-pay | Admitting: Family

## 2024-10-30 DIAGNOSIS — R232 Flushing: Secondary | ICD-10-CM

## 2024-10-31 NOTE — Telephone Encounter (Signed)
 pt needs OV, sent 30d supply
